# Patient Record
Sex: Male | Born: 1960
Health system: Southern US, Community
[De-identification: ages and names within clinical notes are randomized; demographics above are authoritative.]

## PROBLEM LIST (undated history)

## (undated) DIAGNOSIS — I10 Essential (primary) hypertension: Secondary | ICD-10-CM

## (undated) DIAGNOSIS — F419 Anxiety disorder, unspecified: Secondary | ICD-10-CM

## (undated) DIAGNOSIS — Z87442 Personal history of urinary calculi: Secondary | ICD-10-CM

## (undated) DIAGNOSIS — K219 Gastro-esophageal reflux disease without esophagitis: Secondary | ICD-10-CM

## (undated) DIAGNOSIS — E785 Hyperlipidemia, unspecified: Secondary | ICD-10-CM

## (undated) DIAGNOSIS — D1720 Benign lipomatous neoplasm of skin and subcutaneous tissue of unspecified limb: Secondary | ICD-10-CM

## (undated) DIAGNOSIS — N529 Male erectile dysfunction, unspecified: Secondary | ICD-10-CM

## (undated) DIAGNOSIS — Z87898 Personal history of other specified conditions: Secondary | ICD-10-CM

## (undated) DIAGNOSIS — N1330 Unspecified hydronephrosis: Secondary | ICD-10-CM

## (undated) DIAGNOSIS — T884XXA Failed or difficult intubation, initial encounter: Secondary | ICD-10-CM

## (undated) DIAGNOSIS — Z8719 Personal history of other diseases of the digestive system: Secondary | ICD-10-CM

## (undated) DIAGNOSIS — E119 Type 2 diabetes mellitus without complications: Secondary | ICD-10-CM

## (undated) DIAGNOSIS — Z860100 Personal history of colon polyps, unspecified: Secondary | ICD-10-CM

## (undated) DIAGNOSIS — D1779 Benign lipomatous neoplasm of other sites: Secondary | ICD-10-CM

## (undated) DIAGNOSIS — R519 Headache, unspecified: Secondary | ICD-10-CM

## (undated) HISTORY — DX: Anxiety disorder, unspecified: F41.9

## (undated) HISTORY — DX: Hyperlipidemia, unspecified: E78.5

## (undated) HISTORY — DX: Personal history of colonic polyps: Z86.010

## (undated) HISTORY — DX: Benign lipomatous neoplasm of skin and subcutaneous tissue of unspecified limb: D17.20

## (undated) HISTORY — DX: Essential (primary) hypertension: I10

## (undated) HISTORY — DX: Personal history of colon polyps, unspecified: Z86.0100

## (undated) HISTORY — DX: Benign lipomatous neoplasm of other sites: D17.79

## (undated) HISTORY — PX: INGUINAL HERNIA REPAIR: SUR1180

## (undated) HISTORY — PX: VENTRAL HERNIA REPAIR: SHX424

## (undated) HISTORY — DX: Male erectile dysfunction, unspecified: N52.9

## (undated) HISTORY — DX: Type 2 diabetes mellitus without complications: E11.9

---
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
## 1987-01-29 HISTORY — PX: BRAIN SURGERY: SHX531

## 2008-05-11 ENCOUNTER — Ambulatory Visit: Payer: Self-pay | Admitting: Family Medicine

## 2008-08-03 ENCOUNTER — Other Ambulatory Visit: Payer: Self-pay | Admitting: Family Medicine

## 2009-06-19 ENCOUNTER — Other Ambulatory Visit: Payer: Self-pay

## 2009-06-20 ENCOUNTER — Emergency Department: Payer: Self-pay

## 2009-06-28 ENCOUNTER — Ambulatory Visit: Payer: Self-pay | Admitting: Family Medicine

## 2009-06-28 LAB — HM COLONOSCOPY: HM Colonoscopy: NORMAL

## 2009-09-11 ENCOUNTER — Emergency Department: Payer: Self-pay | Admitting: Emergency Medicine

## 2009-10-13 ENCOUNTER — Ambulatory Visit: Payer: Self-pay | Admitting: Unknown Physician Specialty

## 2010-09-24 LAB — HM DIABETES EYE EXAM

## 2010-12-17 ENCOUNTER — Ambulatory Visit: Payer: Self-pay | Admitting: Internal Medicine

## 2011-01-04 ENCOUNTER — Encounter: Payer: Self-pay | Admitting: Internal Medicine

## 2011-01-04 ENCOUNTER — Ambulatory Visit (INDEPENDENT_AMBULATORY_CARE_PROVIDER_SITE_OTHER): Payer: PRIVATE HEALTH INSURANCE | Admitting: Internal Medicine

## 2011-01-04 VITALS — BP 132/82 | HR 72 | Temp 98.2°F | Wt 210.0 lb

## 2011-01-04 DIAGNOSIS — R079 Chest pain, unspecified: Secondary | ICD-10-CM

## 2011-01-04 DIAGNOSIS — J069 Acute upper respiratory infection, unspecified: Secondary | ICD-10-CM

## 2011-01-04 DIAGNOSIS — I10 Essential (primary) hypertension: Secondary | ICD-10-CM

## 2011-01-04 DIAGNOSIS — R7309 Other abnormal glucose: Secondary | ICD-10-CM

## 2011-01-04 DIAGNOSIS — R739 Hyperglycemia, unspecified: Secondary | ICD-10-CM

## 2011-01-04 HISTORY — DX: Essential (primary) hypertension: I10

## 2011-01-04 MED ORDER — TELMISARTAN-HCTZ 80-12.5 MG PO TABS: 1.0000 | ORAL_TABLET | Freq: Every day | ORAL | Status: DC

## 2011-01-04 NOTE — Progress Notes (Signed)
Subjective:    Patient ID: Andrew Stevens, male    DOB: 17-Apr-1960, 50 y.o.   MRN: [id number]  HPI 50 year old male presents to establish care. He reports a history of hypertension. He reports that he's been taking Micardis for several years. 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 However, he reports that when he checks his blood pressure at home it is typically less than 140/90. He denies any headache or palpitations. He reports full compliance with this medicine.  He notes that approximately one month ago he had a single episode of chest pain which occurred at rest. He was sitting in a chair at home in the evening and developed some tightness across the left side of his chest. It radiated into his jaw. He reports that it resolved after several minutes of rest. He did not have any shortness of breath, diaphoresis, or nausea during this time. This pain has not recurred. He had an EKG performed at his wife's medical office which was normal.  He also brings record today from recent employee health screening. This screening lab work was done nonfasting. It showed elevated blood sugar of 147. It also showed elevated hemoglobin A1c of 6.7%. It also showed elevated cholesterol with total cholesterol greater than 200 and LDL cholesterol of 128. Patient is concerned that this lab work was not completed fasting. He would like to have it repeated. He has never been diagnosed with diabetes.  He also notes a several week history of clear nasal congestion and occasional cough. He notes that he cares for his grandchildren who are often sick. He has not had fever or chills. He has been taking DayQuil and NyQuil as needed for cough and congestion.  Outpatient Encounter Prescriptions as of 01/04/2011  Medication Sig Dispense Refill  . Multiple Vitamins-Minerals (MULTIVITAMIN WITH MINERALS) tablet Take 1 tablet by mouth  daily.        . naproxen sodium (ANAPROX) 220 MG tablet Take 220 mg by mouth as needed.        Marland Kitchen telmisartan-hydrochlorothiazide (MICARDIS HCT) 80-12.5 MG per tablet Take 1 tablet by mouth daily.  30 tablet  6    Review of Systems  Constitutional: Negative for fever, chills, activity change, appetite change, fatigue and unexpected weight change.  HENT: Positive for congestion, rhinorrhea and postnasal drip. Negative for sinus pressure.   Eyes: Negative for visual disturbance.  Respiratory: Positive for cough and chest tightness. Negative for shortness of breath.   Cardiovascular: Positive for chest pain. Negative for palpitations and leg swelling.  Gastrointestinal: Negative for abdominal pain and abdominal distention.  Genitourinary: Negative for dysuria, urgency and difficulty urinating.  Musculoskeletal: Negative for arthralgias and gait problem.  Skin: Negative for color change and rash.  Hematological: Negative for adenopathy.  Psychiatric/Behavioral: Negative for sleep disturbance and dysphoric mood. The patient is not nervous/anxious.    BP 132/82  Pulse 72  Temp(Src) 98.2 F (36.8 C) (Oral)  Wt 210 lb (95.255 kg)  SpO2 97%     Objective:   Physical Exam  Constitutional: He is oriented to person, place, and time. He appears well-developed and well-nourished. No distress.  HENT:  Head: Normocephalic and atraumatic.  Right Ear: External ear normal.  Left Ear: External ear normal.  Nose: Nose normal.  Mouth/Throat: Oropharynx is clear and moist. No oropharyngeal exudate.  Eyes: Conjunctivae and EOM are normal. Pupils are equal, round, and reactive to  light. Right eye exhibits no discharge. Left eye exhibits no discharge. No scleral icterus.  Neck: Normal range of motion. Neck supple. No tracheal deviation present. No thyromegaly present.  Cardiovascular: Normal rate, regular rhythm and normal heart sounds.  Exam reveals no gallop and no friction rub.   No murmur  heard. Pulmonary/Chest: Effort normal and breath sounds normal. No respiratory distress. He has no wheezes. He has no rales. He exhibits no tenderness.  Abdominal: Soft. Bowel sounds are normal. He exhibits no distension and no mass. There is no tenderness. There is no rebound and no guarding.  Musculoskeletal: Normal range of motion. He exhibits no edema.  Lymphadenopathy:    He has no cervical adenopathy.  Neurological: He is alert and oriented to person, place, and time. No cranial nerve deficit. Coordination normal.  Skin: Skin is warm and dry. No rash noted. He is not diaphoretic. No erythema. No pallor.  Psychiatric: He has a normal mood and affect. His behavior is normal. Judgment and thought content normal.          Assessment & Plan:  1. Hypertension - blood pressure well-controlled today. We'll continue Micardis. We'll check renal function with labs.  2. Chest pain - his description of this single episode of chest pain is concerning for cardiac etiology. His EKG today is difficult to interpret because of frequent ventricular ectopic beats. Would like to get a baseline treadmill stress test for evaluation. We will set this up for him. We will also do some risk assessment by rechecking lipids fasting.  3. Hyperglycemia -patient brings record of labs from employee health which showed elevated blood sugar of 147 and hemoglobin A1c of 6.7%. These were not done fasting. We'll recheck these labs fasting. Will bring patient back in one month to review.  4. Viral URI - patients symptoms are most consistent with viral upper respiratory infection. His exam is normal. He will continue to use over-the-counter cough and cold preparations as needed. He will call if symptoms are persistent or fever develops.  5. Health maintenance - patient is up-to-date on health maintenance including his flu shot. Will obtain records from his previous physician.

## 2011-01-05 ENCOUNTER — Other Ambulatory Visit: Payer: Self-pay | Admitting: Internal Medicine

## 2011-01-31 ENCOUNTER — Ambulatory Visit (INDEPENDENT_AMBULATORY_CARE_PROVIDER_SITE_OTHER): Payer: PRIVATE HEALTH INSURANCE | Admitting: Cardiovascular Disease

## 2011-01-31 ENCOUNTER — Encounter: Payer: Self-pay | Admitting: Cardiovascular Disease

## 2011-01-31 DIAGNOSIS — R079 Chest pain, unspecified: Secondary | ICD-10-CM | POA: Insufficient documentation

## 2011-01-31 NOTE — Progress Notes (Signed)
Exercise Treadmill Test  Treadmill ordered for recent epsiodes of chest pain.  Resting EKG shows NSR with rate of 70 bpm, No significant ST or T wave changes Resting blood pressure of 150/78. Stand bruce protocal was used.  Patient exercised for 8 min  Peak heart rate of 159 bpm.  This was 95% of the maximum predicted heart rate (target heart rate 168). Achieved 10.1 METS No symptoms of chest pain or lightheadedness were reported at peak stress or in recovery.  Peak Blood pressure recorded was 188/88. Heart rate at 3 minutes in recovery was 108 bpm. No significant ST abnormality noted with stress concerning for ischemia.  Peak ST depression was 0.7 mm upsloping, which is within normal limits  FINAL IMPRESSION: Normal exercise stress test. No significant EKG changes concerning for ischemia. Excellent exercise tolerance.

## 2011-01-31 NOTE — Patient Instructions (Signed)
Your treadmill study was normal. No further workup needed at this time. The report will be forwarded to your primary care physician.

## 2011-02-01 ENCOUNTER — Other Ambulatory Visit: Payer: Self-pay | Admitting: Cardiovascular Disease

## 2011-02-08 ENCOUNTER — Ambulatory Visit: Payer: PRIVATE HEALTH INSURANCE | Admitting: Internal Medicine

## 2011-06-10 ENCOUNTER — Other Ambulatory Visit: Payer: Self-pay | Admitting: Internal Medicine

## 2011-06-10 ENCOUNTER — Ambulatory Visit (INDEPENDENT_AMBULATORY_CARE_PROVIDER_SITE_OTHER): Payer: PRIVATE HEALTH INSURANCE | Admitting: Internal Medicine

## 2011-06-10 ENCOUNTER — Encounter: Payer: Self-pay | Admitting: Internal Medicine

## 2011-06-10 VITALS — BP 160/103 | HR 72 | Temp 98.0°F | Resp 16 | Wt 213.8 lb

## 2011-06-10 DIAGNOSIS — R739 Hyperglycemia, unspecified: Secondary | ICD-10-CM | POA: Insufficient documentation

## 2011-06-10 DIAGNOSIS — N529 Male erectile dysfunction, unspecified: Secondary | ICD-10-CM

## 2011-06-10 DIAGNOSIS — R7309 Other abnormal glucose: Secondary | ICD-10-CM

## 2011-06-10 DIAGNOSIS — I10 Essential (primary) hypertension: Secondary | ICD-10-CM

## 2011-06-10 LAB — COMPREHENSIVE METABOLIC PANEL
Albumin: 4 g/dL (ref 3.4–5.0)
Alkaline Phosphatase: 65 U/L (ref 50–136)
Anion Gap: 7 (ref 7–16)
BUN: 10 mg/dL (ref 7–18)
Bilirubin,Total: 0.7 mg/dL (ref 0.2–1.0)
EGFR (African American): >60
Glucose: 92 mg/dL (ref 65–99)
Osmolality: 280 (ref 275–301)
Potassium: 3.7 mmol/L (ref 3.5–5.1)
SGOT(AST): 21 U/L (ref 15–37)
SGPT (ALT): 25 U/L

## 2011-06-10 LAB — TSH: Thyroid Stimulating Horm: 1.7 u[IU]/mL

## 2011-06-10 LAB — LIPID PANEL
Cholesterol: 167 mg/dL (ref 0–200)
Triglycerides: 79 mg/dL (ref 0–200)

## 2011-06-10 MED ORDER — TELMISARTAN-HCTZ 80-12.5 MG PO TABS: 1.0000 | ORAL_TABLET | Freq: Every day | ORAL | Status: DC

## 2011-06-10 NOTE — Assessment & Plan Note (Signed)
Will check testosterone level with labs. However, based on symptoms suspect erectile dysfunction is secondary to hypertension and potentially diabetes. Followup in 2-3 weeks. We discussed the potential of starting Cialis, however he would prefer to hold off on this today.

## 2011-06-10 NOTE — Assessment & Plan Note (Signed)
Blood pressure markedly elevated today, however patient has not been taking his medication. Will refill medication. Will check renal function with labs. Followup in 3-4 weeks.

## 2011-06-10 NOTE — Progress Notes (Signed)
Subjective:    Patient ID: Andrew Stevens, male    DOB: [birth year], 51 y.o.   MRN: [id number]  HPI 51 year old male with history of hypertension presents for followup. He reports that he has been out of his Micardis hydrochlorothiazide for the last few weeks. He has been using samples of plain Micardis which his wife was able to obtain. He denies any headache, chest pain, palpitations. He has not been checking his blood pressure.  He is also concerned today about recent issues with erectile dysfunction. He reports difficulty establishing and maintaining an erection. He questions whether his testosterone may be low. He denies any decreased libido or fatigue.  Today, we reviewed lab work from December 2012. This showed elevated blood sugar with hemoglobin A1c of 6.7%. He has never been diagnosed with diabetes. He has never taken medications for elevated blood sugar.  Outpatient Encounter Prescriptions as of 06/10/2011  Medication Sig Dispense Refill  . Multiple Vitamins-Minerals (MULTIVITAMIN WITH MINERALS) tablet Take 1 tablet by mouth daily.        . naproxen sodium (ANAPROX) 220 MG tablet Take 220 mg by mouth as needed.        Marland Kitchen telmisartan-hydrochlorothiazide (MICARDIS HCT) 80-12.5 MG per tablet Take 1 tablet by mouth daily.  30 tablet  6  . DISCONTD: telmisartan-hydrochlorothiazide (MICARDIS HCT) 80-12.5 MG per tablet Take 1 tablet by mouth daily.  30 tablet  6   BP 160/103  Pulse 72  Temp(Src) 98 F (36.7 C) (Oral)  Resp 16  Wt 213 lb 12 oz (96.956 kg)  SpO2 98%  Review of Systems  Constitutional: Negative for fever, chills, activity change, appetite change, fatigue and unexpected weight change.  Eyes: Negative for visual disturbance.  Respiratory: Negative for cough and shortness of breath.   Cardiovascular: Negative for chest pain, palpitations and leg swelling.  Gastrointestinal: Negative for abdominal pain and abdominal distention.  Genitourinary: Negative for dysuria, urgency  and difficulty urinating.  Musculoskeletal: Negative for arthralgias and gait problem.  Skin: Negative for color change and rash.  Hematological: Negative for adenopathy.  Psychiatric/Behavioral: Negative for sleep disturbance and dysphoric mood. The patient is not nervous/anxious.        Objective:   Physical Exam  Constitutional: He is oriented to person, place, and time. He appears well-developed and well-nourished. No distress.  HENT:  Head: Normocephalic and atraumatic.  Right Ear: External ear normal.  Left Ear: External ear normal.  Nose: Nose normal.  Mouth/Throat: Oropharynx is clear and moist. No oropharyngeal exudate.  Eyes: Conjunctivae and EOM are normal. Pupils are equal, round, and reactive to light. Right eye exhibits no discharge. Left eye exhibits no discharge. No scleral icterus.  Neck: Normal range of motion. Neck supple. No tracheal deviation present. No thyromegaly present.  Cardiovascular: Normal rate, regular rhythm and normal heart sounds.  Exam reveals no gallop and no friction rub.   No murmur heard. Pulmonary/Chest: Effort normal and breath sounds normal. No respiratory distress. He has no wheezes. He has no rales. He exhibits no tenderness.  Musculoskeletal: Normal range of motion. He exhibits no edema.  Lymphadenopathy:    He has no cervical adenopathy.  Neurological: He is alert and oriented to person, place, and time. No cranial nerve deficit. Coordination normal.  Skin: Skin is warm and dry. No rash noted. He is not diaphoretic. No erythema. No pallor.  Psychiatric: He has a normal mood and affect. His behavior is normal. Judgment and thought content normal.  Assessment & Plan:  See problem list

## 2011-06-10 NOTE — Assessment & Plan Note (Signed)
Will recheck blood sugar and hemoglobin A1c with labs today. Patient will return to clinic in 3-4 weeks to review labs. If persistently elevated, would favor starting metformin.

## 2011-06-11 ENCOUNTER — Telehealth: Payer: Self-pay | Admitting: Internal Medicine

## 2011-06-11 MED ORDER — METFORMIN HCL 500 MG PO TABS: 500.0000 mg | ORAL_TABLET | Freq: Two times a day (BID) | ORAL | Status: DC

## 2011-06-11 NOTE — Telephone Encounter (Signed)
Labs show that A1c is elevated at 6.4%. This is consistent with borderline diabetes. I would like for him to stop by the office and pick up a glucometer. He will need to check his blood sugar 1-2 times per day for the next couple of weeks, then bring his readings to his next visit. I would also like to start metformin 500mg  bid.  We can call this into a local pharmacy. Other labs are normal. Free testosterone is still pending.

## 2011-06-11 NOTE — Telephone Encounter (Signed)
Patient notified. Patient will come in for glucometer. rx for metformin called to pharmacy.

## 2011-06-14 ENCOUNTER — Encounter: Payer: Self-pay | Admitting: Internal Medicine

## 2011-06-17 ENCOUNTER — Telehealth: Payer: Self-pay | Admitting: *Deleted

## 2011-06-17 NOTE — Telephone Encounter (Signed)
Patient wanted to let you know that he has decided to not take the metformin, he is going to get his sugar under control with watching what he eats He says that he will discuss this with you at his next follow up.

## 2011-07-05 ENCOUNTER — Other Ambulatory Visit: Payer: Self-pay | Admitting: Internal Medicine

## 2011-07-05 DIAGNOSIS — E119 Type 2 diabetes mellitus without complications: Secondary | ICD-10-CM

## 2011-07-05 MED ORDER — GLUCOSE BLOOD VI STRP: ORAL_STRIP | Status: DC

## 2011-07-05 MED ORDER — FREESTYLE LANCETS MISC: Status: DC

## 2011-07-11 ENCOUNTER — Encounter: Payer: Self-pay | Admitting: Internal Medicine

## 2011-07-11 ENCOUNTER — Ambulatory Visit (INDEPENDENT_AMBULATORY_CARE_PROVIDER_SITE_OTHER): Payer: PRIVATE HEALTH INSURANCE | Admitting: Internal Medicine

## 2011-07-11 VITALS — BP 112/80 | HR 76 | Temp 98.8°F | Wt 205.5 lb

## 2011-07-11 DIAGNOSIS — R7309 Other abnormal glucose: Secondary | ICD-10-CM

## 2011-07-11 DIAGNOSIS — M5430 Sciatica, unspecified side: Secondary | ICD-10-CM

## 2011-07-11 DIAGNOSIS — I10 Essential (primary) hypertension: Secondary | ICD-10-CM

## 2011-07-11 DIAGNOSIS — M5431 Sciatica, right side: Secondary | ICD-10-CM | POA: Insufficient documentation

## 2011-07-11 DIAGNOSIS — R739 Hyperglycemia, unspecified: Secondary | ICD-10-CM

## 2011-07-11 NOTE — Assessment & Plan Note (Signed)
A1c was 6.4% on recent labs. Encourage continued compliance with healthy diet and regular physical activity. Will plan to repeat A1c in August 2013.

## 2011-07-11 NOTE — Progress Notes (Signed)
Subjective:    Patient ID: Andrew Stevens, male    DOB: [birth year], 51 y.o.   MRN: [id number]  HPI 51 year old male with history of hypertension, elevated blood sugars presents for followup. In regards to his blood sugars, recent A1c was 6.4%. He notes that blood sugars have been between 90 and 112 fasting. He reports significant improvement in diet with increased intake of fruits and vegetables and decreased processed carbohydrates. He is also starting some physical activity with walking.  In regards to his hypertension, he reports better compliance with his medications. He has not been regularly checking his blood pressure. He denies any chest pain, headache, palpitations.  He recently was evaluated for right-sided sciatic pain and has been using ibuprofen and massage therapy with some improvement in pain. He notes that the pain initially radiated down the back of his right leg leading to some numbness in his toes. This has resolved.  Outpatient Encounter Prescriptions as of 07/11/2011  Medication Sig Dispense Refill  . glucose blood (FREESTYLE LITE) test strip Use as instructed  100 each  12  . Lancets (FREESTYLE) lancets Use as instructed  100 each  12  . Multiple Vitamins-Minerals (MULTIVITAMIN WITH MINERALS) tablet Take 1 tablet by mouth daily.        . naproxen sodium (ANAPROX) 220 MG tablet Take 220 mg by mouth as needed.        Marland Kitchen telmisartan-hydrochlorothiazide (MICARDIS HCT) 80-12.5 MG per tablet Take 1 tablet by mouth daily.  30 tablet  6  . DISCONTD: metFORMIN (GLUCOPHAGE) 500 MG tablet Take 1 tablet (500 mg total) by mouth 2 (two) times daily with a meal.  60 tablet  2    Review of Systems  Constitutional: Negative for fever, chills, activity change, appetite change, fatigue and unexpected weight change.  Eyes: Negative for visual disturbance.  Respiratory: Negative for cough and shortness of breath.   Cardiovascular: Negative for chest pain, palpitations and leg swelling.    Gastrointestinal: Negative for abdominal pain and abdominal distention.  Genitourinary: Negative for dysuria, urgency and difficulty urinating.  Musculoskeletal: Positive for myalgias and arthralgias. Negative for gait problem.  Skin: Negative for color change and rash.  Neurological: Positive for numbness. Negative for weakness.  Hematological: Negative for adenopathy.  Psychiatric/Behavioral: Negative for disturbed wake/sleep cycle and dysphoric mood. The patient is not nervous/anxious.    BP 112/80  Pulse 76  Temp 98.8 F (37.1 C) (Oral)  Wt 205 lb 8 oz (93.214 kg)  SpO2 98%     Objective:   Physical Exam  Constitutional: He is oriented to person, place, and time. He appears well-developed and well-nourished. No distress.  HENT:  Head: Normocephalic and atraumatic.  Right Ear: External ear normal.  Left Ear: External ear normal.  Nose: Nose normal.  Mouth/Throat: Oropharynx is clear and moist. No oropharyngeal exudate.  Eyes: Conjunctivae and EOM are normal. Pupils are equal, round, and reactive to light. Right eye exhibits no discharge. Left eye exhibits no discharge. No scleral icterus.  Neck: Normal range of motion. Neck supple. No tracheal deviation present. No thyromegaly present.  Cardiovascular: Normal rate, regular rhythm and normal heart sounds.  Exam reveals no gallop and no friction rub.   No murmur heard. Pulmonary/Chest: Effort normal and breath sounds normal. No respiratory distress. He has no wheezes. He has no rales. He exhibits no tenderness.  Musculoskeletal: Normal range of motion. He exhibits no edema.  Lymphadenopathy:    He has no cervical adenopathy.  Neurological: He is alert  and oriented to person, place, and time. No cranial nerve deficit. Coordination normal.  Skin: Skin is warm and dry. No rash noted. He is not diaphoretic. No erythema. No pallor.  Psychiatric: He has a normal mood and affect. His behavior is normal. Judgment and thought content  normal.          Assessment & Plan:

## 2011-07-11 NOTE — Assessment & Plan Note (Signed)
Symptoms improved with ibuprofen and massage. We'll continue to monitor.

## 2011-07-11 NOTE — Assessment & Plan Note (Signed)
Blood pressure much improved today with better compliance with diet and medication. Encourage continued efforts at healthy diet, physical exercise and compliance with medicines. Will recheck renal function in August 2013.

## 2011-07-12 ENCOUNTER — Telehealth: Payer: Self-pay | Admitting: Internal Medicine

## 2011-07-12 NOTE — Telephone Encounter (Signed)
Free testosterone was normal.

## 2011-07-12 NOTE — Telephone Encounter (Signed)
Patients spouse advised as instructed via telephone. 

## 2011-09-09 ENCOUNTER — Ambulatory Visit: Payer: PRIVATE HEALTH INSURANCE | Admitting: Internal Medicine

## 2011-10-17 ENCOUNTER — Ambulatory Visit: Payer: Self-pay | Admitting: General Practice

## 2011-12-11 ENCOUNTER — Ambulatory Visit (INDEPENDENT_AMBULATORY_CARE_PROVIDER_SITE_OTHER): Payer: Worker's Compensation | Admitting: Cardiovascular Disease

## 2011-12-11 ENCOUNTER — Encounter: Payer: Self-pay | Admitting: Cardiovascular Disease

## 2011-12-11 VITALS — BP 143/92 | HR 73 | Ht 70.0 in | Wt 206.8 lb

## 2011-12-11 DIAGNOSIS — M75100 Unspecified rotator cuff tear or rupture of unspecified shoulder, not specified as traumatic: Secondary | ICD-10-CM

## 2011-12-11 DIAGNOSIS — I10 Essential (primary) hypertension: Secondary | ICD-10-CM

## 2011-12-11 DIAGNOSIS — R0789 Other chest pain: Secondary | ICD-10-CM

## 2011-12-11 DIAGNOSIS — S43429A Sprain of unspecified rotator cuff capsule, initial encounter: Secondary | ICD-10-CM

## 2011-12-11 NOTE — Progress Notes (Signed)
   Patient ID: Andrew Stevens, male    DOB: [birth year], 51 y.o.   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  He reports that in July 2013, he was pushing a bed in the hospital and he hit a wall with trauma to his right shoulder. He did undergo evaluation with cortisone shot, MRI and physical therapy. He has good range of motion but some clicking and pain with certain maneuvers. It was recommended that he have surgery to repair for rotator cuff.   He does have occasional GERD and takes TUMS when necessary with good relief. He is otherwise active with no symptoms of chest pain or shortness of breath. No significant lightheadedness or dizziness, no edema. Overall he feels well apart from his shoulder.  EKG shows normal sinus rhythm with rate 72 beats per minute with no significant ST or T wave changes.   Outpatient Encounter Prescriptions as of 12/11/2011  Medication Sig Dispense Refill  . Multiple Vitamins-Minerals (MULTIVITAMIN WITH MINERALS) tablet Take 1 tablet by mouth daily.        . Naproxen Sodium (ALEVE PO) Take by mouth as needed.      Marland Kitchen telmisartan-hydrochlorothiazide (MICARDIS HCT) 80-12.5 MG per tablet Take 1 tablet by mouth daily.  30 tablet  6    Review of Systems  Constitutional: Negative.   HENT: Negative.   Eyes: Negative.   Respiratory: Negative.   Cardiovascular: Negative.   Gastrointestinal: Negative.   Musculoskeletal: Positive for arthralgias.  Skin: Negative.   Neurological: Negative.   Hematological: Negative.   Psychiatric/Behavioral: Negative.   All other systems reviewed and are negative.    BP 143/92  Pulse 73  Ht 5\' 10"  (1.778 m)  Wt 206 lb  12 oz (93.781 kg)  BMI 29.67 kg/m2  Physical Exam  Nursing note and vitals reviewed. Constitutional: He is oriented to person, place, and time. He appears well-developed and well-nourished.  HENT:  Head: Normocephalic.  Nose: Nose normal.  Mouth/Throat: Oropharynx is clear and moist.  Eyes: Conjunctivae normal are normal. Pupils are equal, round, and reactive to light.  Neck: Normal range of motion. Neck supple. No JVD present.  Cardiovascular: Normal rate, regular rhythm, S1 normal, S2 normal, normal heart sounds and intact distal pulses.  Exam reveals no gallop and no friction rub.   No murmur heard. Pulmonary/Chest: Effort normal and breath sounds normal. No respiratory distress. He has no wheezes. He has no rales. He exhibits no tenderness.  Abdominal: Soft. Bowel sounds are normal. He exhibits no distension. There is no tenderness.  Musculoskeletal: Normal range of motion. He exhibits no edema and no tenderness.  Lymphadenopathy:    He has no cervical adenopathy.  Neurological: He is alert and oriented to person, place, and time. Coordination normal.  Skin: Skin is warm and dry. No rash noted. No erythema.  Psychiatric: He has a normal mood and affect. His behavior is normal. Judgment and thought content normal.           Assessment and Plan

## 2011-12-11 NOTE — Assessment & Plan Note (Signed)
Blood pressure  is borderline elevated on today's visit. He reports is well controlled at home. No changes made to the medications.

## 2011-12-11 NOTE — Patient Instructions (Addendum)
You are doing well. No medication changes were made.  Please call us if you have new issues that need to be addressed before your next appt.  Your physician wants you to follow-up in: 12 months.  You will receive a reminder letter in the mail two months in advance. If you don't receive a letter, please call our office to schedule the follow-up appointment. 

## 2011-12-11 NOTE — Assessment & Plan Note (Signed)
Prior trauma on the right while at work. MRI by his report confirming rotator cuff tear. Plan is for surgery by Dr. Martha Clan. No further workup needed at this time. He would be acceptable risk. No active angina. No known coronary artery disease. Blood pressures relatively well controlled.

## 2011-12-17 ENCOUNTER — Ambulatory Visit: Payer: Self-pay | Admitting: Orthopedic Surgery

## 2011-12-17 LAB — BASIC METABOLIC PANEL
BUN: 11 mg/dL (ref 7–18)
Calcium, Total: 9.2 mg/dL (ref 8.5–10.1)
Chloride: 104 mmol/L (ref 98–107)
Creatinine: 0.89 mg/dL (ref 0.60–1.30)
Glucose: 113 mg/dL — ABNORMAL HIGH (ref 65–99)
Osmolality: 278 (ref 275–301)
Potassium: 3.7 mmol/L (ref 3.5–5.1)
Sodium: 139 mmol/L (ref 136–145)

## 2011-12-17 LAB — CBC
MCH: 31 pg (ref 26.0–34.0)
MCHC: 35.3 g/dL (ref 32.0–36.0)
Platelet: 261 10*3/uL (ref 150–440)
RBC: 5.25 10*6/uL (ref 4.40–5.90)
WBC: 8 10*3/uL (ref 3.8–10.6)

## 2011-12-17 LAB — URINALYSIS, COMPLETE
Glucose,UR: NEGATIVE mg/dL (ref 0–75)
Ketone: NEGATIVE
Nitrite: NEGATIVE
Ph: 5 (ref 4.5–8.0)
Protein: NEGATIVE
Specific Gravity: 1.021 (ref 1.003–1.030)

## 2011-12-25 ENCOUNTER — Ambulatory Visit: Payer: Self-pay | Admitting: Orthopedic Surgery

## 2012-01-07 ENCOUNTER — Encounter: Payer: Self-pay | Admitting: Orthopedic Surgery

## 2012-01-29 ENCOUNTER — Encounter: Payer: Self-pay | Admitting: Orthopedic Surgery

## 2012-01-29 HISTORY — PX: ROTATOR CUFF REPAIR: SHX139

## 2012-02-29 ENCOUNTER — Encounter: Payer: Self-pay | Admitting: Orthopedic Surgery

## 2012-03-28 ENCOUNTER — Encounter: Payer: Self-pay | Admitting: Orthopedic Surgery

## 2012-04-21 ENCOUNTER — Ambulatory Visit: Payer: Self-pay | Admitting: Orthopedic Surgery

## 2012-04-28 ENCOUNTER — Encounter: Payer: Self-pay | Admitting: Orthopedic Surgery

## 2013-01-28 HISTORY — PX: COLONOSCOPY: SHX174

## 2013-04-20 ENCOUNTER — Encounter: Payer: Self-pay | Admitting: Internal Medicine

## 2013-05-04 ENCOUNTER — Encounter: Payer: Self-pay | Admitting: Internal Medicine

## 2013-05-20 ENCOUNTER — Encounter: Payer: Self-pay | Admitting: Internal Medicine

## 2013-05-20 ENCOUNTER — Ambulatory Visit (INDEPENDENT_AMBULATORY_CARE_PROVIDER_SITE_OTHER): Payer: 59 | Admitting: Internal Medicine

## 2013-05-20 VITALS — BP 160/100 | HR 82 | Temp 98.4°F | Resp 16 | Ht 69.0 in | Wt 204.8 lb

## 2013-05-20 DIAGNOSIS — I10 Essential (primary) hypertension: Secondary | ICD-10-CM

## 2013-05-20 DIAGNOSIS — Z1211 Encounter for screening for malignant neoplasm of colon: Secondary | ICD-10-CM

## 2013-05-20 DIAGNOSIS — Z0000 Encounter for general adult medical examination without abnormal findings: Secondary | ICD-10-CM

## 2013-05-20 HISTORY — DX: Encounter for screening for malignant neoplasm of colon: Z12.11

## 2013-05-20 HISTORY — DX: Encounter for general adult medical examination without abnormal findings: Z00.00

## 2013-05-20 MED ORDER — TELMISARTAN-HCTZ 80-12.5 MG PO TABS: 1.0000 | ORAL_TABLET | Freq: Every day | ORAL | Status: DC

## 2013-05-20 NOTE — Progress Notes (Signed)
Pre visit review using our clinic review tool, if applicable. No additional management support is needed unless otherwise documented below in the visit note. 

## 2013-05-20 NOTE — Assessment & Plan Note (Signed)
BP Readings from Last 3 Encounters:  05/20/13 160/100  12/11/11 143/92  07/11/11 112/80   BP elevated, however pt has been off meds. Will restart Micardis. Will check renal function with labs. Follow up in 2 weeks.

## 2013-05-20 NOTE — Progress Notes (Signed)
Subjective:    Patient ID: Andrew Stevens, male    DOB: [birth year], 53 y.o.   MRN: [id number]  HPI 53YO male presents for annual exam.  HTN - has been out of BP medication for several months. No chest pain, headache, palpitations noted. BP much better controlled at home, typically 130-140s/80s.  Due for colonoscopy. Would like to schedule.  No new concerns today. Trying to follow healthy diet and exercise. Continues to work at [employer].  Review of Systems  Constitutional: Negative for fever, chills, activity change, appetite change, fatigue and unexpected weight change.  Eyes: Negative for visual disturbance.  Respiratory: Negative for cough and shortness of breath.   Cardiovascular: Negative for chest pain, palpitations and leg swelling.  Gastrointestinal: Negative for abdominal pain and abdominal distention.  Genitourinary: Negative for dysuria, urgency and difficulty urinating.  Musculoskeletal: Negative for arthralgias and gait problem.  Skin: Negative for color change and rash.  Hematological: Negative for adenopathy.  Psychiatric/Behavioral: Negative for sleep disturbance and dysphoric mood. The patient is not nervous/anxious.        Objective:    BP 160/100  Pulse 82  Temp(Src) 98.4 F (36.9 C) (Oral)  Resp 16  Ht 5\' 9"  (1.753 m)  Wt 204 lb 12 oz (92.874 kg)  BMI 30.22 kg/m2  SpO2 98% Physical Exam  Constitutional: He is oriented to person, place, and time. He appears well-developed and well-nourished. No distress.  HENT:  Head: Normocephalic and atraumatic.  Right Ear: External ear normal.  Left Ear: External ear normal.  Nose: Nose normal.  Mouth/Throat: Oropharynx is clear and moist. No oropharyngeal exudate.  Eyes: Conjunctivae and EOM are normal. Pupils are equal, round, and reactive to light. Right eye exhibits no discharge. Left eye exhibits no discharge. No scleral icterus.  Neck: Normal range of motion. Neck supple. No tracheal deviation present. No  thyromegaly present.  Cardiovascular: Normal rate, regular rhythm and normal heart sounds.  Exam reveals no gallop and no friction rub.   No murmur heard. Pulmonary/Chest: Effort normal and breath sounds normal. No accessory muscle usage. Not tachypneic. No respiratory distress. He has no decreased breath sounds. He has no wheezes. He has no rhonchi. He has no rales. He exhibits no tenderness.  Musculoskeletal: Normal range of motion. He exhibits no edema.  Lymphadenopathy:    He has no cervical adenopathy.  Neurological: He is alert and oriented to person, place, and time. No cranial nerve deficit. Coordination normal.  Skin: Skin is warm and dry. No rash noted. He is not diaphoretic. No erythema. No pallor.  Psychiatric: He has a normal mood and affect. His behavior is normal. Judgment and thought content normal.          Assessment & Plan:   Problem List Items Addressed This Visit   Hypertension      BP Readings from Last 3 Encounters:  05/20/13 160/100  12/11/11 143/92  07/11/11 112/80   BP elevated, however pt has been off meds. Will restart Micardis. Will check renal function with labs. Follow up in 2 weeks.    Relevant Medications      telmisartan-hydrochlorothiazide (MICARDIS HCT) 80-12.5 MG per tablet   Other Relevant Orders      Comprehensive metabolic panel      Microalbumin / creatinine urine ratio      EKG 12-Lead (Completed)   Routine general medical examination at a health care facility - Primary     General medical exam normal. Encouraged better compliance with medication. Encouraged healthy  diet and exercise. Will check CBC, CMP, lipids, A1c with labs. Immunizations are UTD. Follow up in 2 weeks.    Relevant Orders      CBC with Differential      Lipid panel      Vit D  25 hydroxy (rtn osteoporosis monitoring)      Hemoglobin A1c      PSA, total and free   Screening for colon cancer     Referral placed for colonoscopy.    Relevant Orders      Ambulatory  referral to Gastroenterology       Return in about 2 weeks (around 06/03/2013) for Recheck of Blood Pressure.

## 2013-05-20 NOTE — Assessment & Plan Note (Signed)
Referral placed for colonoscopy. 

## 2013-05-20 NOTE — Assessment & Plan Note (Signed)
General medical exam normal. Encouraged better compliance with medication. Encouraged healthy diet and exercise. Will check CBC, CMP, lipids, A1c with labs. Immunizations are UTD. Follow up in 2 weeks.

## 2013-05-21 ENCOUNTER — Telehealth: Payer: Self-pay | Admitting: Internal Medicine

## 2013-05-21 NOTE — Telephone Encounter (Signed)
Relevant patient education assigned to patient using Emmi. ° °

## 2013-06-08 ENCOUNTER — Ambulatory Visit (INDEPENDENT_AMBULATORY_CARE_PROVIDER_SITE_OTHER): Payer: 59 | Admitting: Internal Medicine

## 2013-06-08 ENCOUNTER — Encounter: Payer: Self-pay | Admitting: Internal Medicine

## 2013-06-08 VITALS — BP 142/80 | HR 89 | Resp 16 | Wt 203.2 lb

## 2013-06-08 DIAGNOSIS — I10 Essential (primary) hypertension: Secondary | ICD-10-CM

## 2013-06-08 NOTE — Progress Notes (Signed)
Pre visit review using our clinic review tool, if applicable. No additional management support is needed unless otherwise documented below in the visit note. 

## 2013-06-08 NOTE — Assessment & Plan Note (Signed)
BP Readings from Last 3 Encounters:  06/08/13 142/80  05/20/13 160/100  12/11/11 143/92   Blood pressure improved today compared to recent check. We'll continue Micardis hydrochlorothiazide. Plan check labs tomorrow including renal function. Followup in 3 months or sooner as needed.

## 2013-06-08 NOTE — Progress Notes (Signed)
   Subjective:    Patient ID: Andrew Stevens, male    DOB: July 17, 1960, 53 y.o.   MRN: [id number]  HPI 53YO male presents for follow up.  HTN - typically below 140 SBP. Taking Micardis-HCTZ. No recent chest pain, palpitations, headache.  Review of Systems  Constitutional: Negative for fever, chills, activity change, appetite change, fatigue and unexpected weight change.  Eyes: Negative for visual disturbance.  Respiratory: Negative for cough and shortness of breath.   Cardiovascular: Negative for chest pain, palpitations and leg swelling.  Gastrointestinal: Negative for abdominal pain and abdominal distention.  Genitourinary: Negative for dysuria, urgency and difficulty urinating.  Musculoskeletal: Negative for arthralgias and gait problem.  Skin: Negative for color change and rash.  Hematological: Negative for adenopathy.  Psychiatric/Behavioral: Negative for sleep disturbance and dysphoric mood. The patient is not nervous/anxious.        Objective:    BP 142/80  Pulse 89  Resp 16  Wt 203 lb 4 oz (92.194 kg)  SpO2 97% Physical Exam  Constitutional: He is oriented to person, place, and time. He appears well-developed and well-nourished. No distress.  HENT:  Head: Normocephalic and atraumatic.  Right Ear: External ear normal.  Left Ear: External ear normal.  Nose: Nose normal.  Mouth/Throat: Oropharynx is clear and moist. No oropharyngeal exudate.  Eyes: Conjunctivae and EOM are normal. Pupils are equal, round, and reactive to light. Right eye exhibits no discharge. Left eye exhibits no discharge. No scleral icterus.  Neck: Normal range of motion. Neck supple. No tracheal deviation present. No thyromegaly present.  Cardiovascular: Normal rate, regular rhythm and normal heart sounds.  Exam reveals no gallop and no friction rub.   No murmur heard. Pulmonary/Chest: Effort normal and breath sounds normal. No accessory muscle usage. Not tachypneic. No respiratory distress. He has no  decreased breath sounds. He has no wheezes. He has no rhonchi. He has no rales. He exhibits no tenderness.  Musculoskeletal: Normal range of motion. He exhibits no edema.  Lymphadenopathy:    He has no cervical adenopathy.  Neurological: He is alert and oriented to person, place, and time. No cranial nerve deficit. Coordination normal.  Skin: Skin is warm and dry. No rash noted. He is not diaphoretic. No erythema. No pallor.  Psychiatric: He has a normal mood and affect. His behavior is normal. Judgment and thought content normal.          Assessment & Plan:   Problem List Items Addressed This Visit   None       No Follow-up on file.

## 2013-06-09 ENCOUNTER — Other Ambulatory Visit: Payer: Self-pay | Admitting: *Deleted

## 2013-06-09 ENCOUNTER — Other Ambulatory Visit (INDEPENDENT_AMBULATORY_CARE_PROVIDER_SITE_OTHER): Payer: 59

## 2013-06-09 ENCOUNTER — Telehealth: Payer: Self-pay | Admitting: Internal Medicine

## 2013-06-09 DIAGNOSIS — I10 Essential (primary) hypertension: Secondary | ICD-10-CM

## 2013-06-09 DIAGNOSIS — Z0000 Encounter for general adult medical examination without abnormal findings: Secondary | ICD-10-CM

## 2013-06-09 LAB — LIPID PANEL
CHOL/HDL RATIO: 4
CHOLESTEROL: 205 mg/dL — AB (ref 0–200)
HDL: 48.1 mg/dL (ref >39.00)
LDL CALC: 146 mg/dL — AB (ref 0–99)
Triglycerides: 55 mg/dL (ref 0.0–149.0)
VLDL: 11 mg/dL (ref 0.0–40.0)

## 2013-06-09 LAB — MICROALBUMIN / CREATININE URINE RATIO
CREATININE, U: 157.3 mg/dL
Microalb Creat Ratio: 1 mg/g (ref 0.0–30.0)
Microalb, Ur: 1.6 mg/dL (ref 0.0–1.9)

## 2013-06-09 LAB — CBC WITH DIFFERENTIAL/PLATELET
BASOS ABS: 0 10*3/uL (ref 0.0–0.1)
Basophils Relative: 0.5 % (ref 0.0–3.0)
EOS PCT: 1.9 % (ref 0.0–5.0)
Eosinophils Absolute: 0.2 10*3/uL (ref 0.0–0.7)
HEMATOCRIT: 46.5 % (ref 39.0–52.0)
Hemoglobin: 15.8 g/dL (ref 13.0–17.0)
Lymphocytes Relative: 17.9 % (ref 12.0–46.0)
Lymphs Abs: 1.4 10*3/uL (ref 0.7–4.0)
MCHC: 33.9 g/dL (ref 30.0–36.0)
MCV: 89.6 fl (ref 78.0–100.0)
MONO ABS: 0.3 10*3/uL (ref 0.1–1.0)
Monocytes Relative: 4.1 % (ref 3.0–12.0)
NEUTROS PCT: 75.6 % (ref 43.0–77.0)
Neutro Abs: 6.1 10*3/uL (ref 1.4–7.7)
Platelets: 259 10*3/uL (ref 150.0–400.0)
RBC: 5.2 Mil/uL (ref 4.22–5.81)
RDW: 13.8 % (ref 11.5–15.5)
WBC: 8.1 10*3/uL (ref 4.0–10.5)

## 2013-06-09 LAB — COMPREHENSIVE METABOLIC PANEL
ALK PHOS: 58 U/L (ref 39–117)
ALT: 23 U/L (ref 0–53)
AST: 27 U/L (ref 0–37)
Albumin: 4.3 g/dL (ref 3.5–5.2)
BUN: 15 mg/dL (ref 6–23)
CO2: 32 mEq/L (ref 19–32)
Calcium: 9.3 mg/dL (ref 8.4–10.5)
Chloride: 102 mEq/L (ref 96–112)
Creatinine, Ser: 0.9 mg/dL (ref 0.4–1.5)
GFR: 93.26 mL/min (ref >60.00)
GLUCOSE: 138 mg/dL — AB (ref 70–99)
Potassium: 4.5 mEq/L (ref 3.5–5.1)
SODIUM: 141 meq/L (ref 135–145)
TOTAL PROTEIN: 7.6 g/dL (ref 6.0–8.3)
Total Bilirubin: 0.9 mg/dL (ref 0.2–1.2)

## 2013-06-09 LAB — HEMOGLOBIN A1C: HEMOGLOBIN A1C: 6.5 % (ref 4.6–6.5)

## 2013-06-09 MED ORDER — GLUCOSE BLOOD VI STRP: ORAL_STRIP | Status: DC

## 2013-06-09 MED ORDER — ATORVASTATIN CALCIUM 20 MG PO TABS: 20.0000 mg | ORAL_TABLET | Freq: Every day | ORAL | Status: DC

## 2013-06-09 MED ORDER — METFORMIN HCL 500 MG PO TABS: 500.0000 mg | ORAL_TABLET | Freq: Two times a day (BID) | ORAL | Status: DC

## 2013-06-09 MED ORDER — LANCETS MISC: Status: DC

## 2013-06-09 MED ORDER — ONETOUCH ULTRA SYSTEM W/DEVICE KIT: PACK | Status: DC

## 2013-06-09 NOTE — Telephone Encounter (Signed)
Relevant patient education assigned to patient using Emmi. ° °

## 2013-06-10 LAB — VITAMIN D 25 HYDROXY (VIT D DEFICIENCY, FRACTURES): VIT D 25 HYDROXY: 31 ng/mL (ref 30–89)

## 2013-06-10 LAB — PSA, TOTAL AND FREE
PSA, Free Pct: 33 % (ref >25)
PSA, Free: 0.43 ng/mL
PSA: 1.31 ng/mL (ref <=4.00)

## 2013-09-03 LAB — HM DIABETES EYE EXAM

## 2013-09-23 ENCOUNTER — Encounter: Payer: Self-pay | Admitting: Internal Medicine

## 2013-09-23 ENCOUNTER — Ambulatory Visit (INDEPENDENT_AMBULATORY_CARE_PROVIDER_SITE_OTHER): Payer: 59 | Admitting: Internal Medicine

## 2013-09-23 VITALS — BP 142/82 | HR 67 | Ht 69.0 in | Wt 199.2 lb

## 2013-09-23 DIAGNOSIS — I10 Essential (primary) hypertension: Secondary | ICD-10-CM

## 2013-09-23 DIAGNOSIS — E1169 Type 2 diabetes mellitus with other specified complication: Secondary | ICD-10-CM | POA: Insufficient documentation

## 2013-09-23 DIAGNOSIS — E119 Type 2 diabetes mellitus without complications: Secondary | ICD-10-CM

## 2013-09-23 DIAGNOSIS — E118 Type 2 diabetes mellitus with unspecified complications: Secondary | ICD-10-CM | POA: Insufficient documentation

## 2013-09-23 DIAGNOSIS — E785 Hyperlipidemia, unspecified: Secondary | ICD-10-CM

## 2013-09-23 LAB — HM DIABETES FOOT EXAM: HM Diabetic Foot Exam: NORMAL

## 2013-09-23 MED ORDER — ATORVASTATIN CALCIUM 20 MG PO TABS: 20.0000 mg | ORAL_TABLET | Freq: Every day | ORAL | Status: DC

## 2013-09-23 MED ORDER — METFORMIN HCL 500 MG PO TABS: 500.0000 mg | ORAL_TABLET | Freq: Two times a day (BID) | ORAL | Status: DC

## 2013-09-23 NOTE — Patient Instructions (Signed)
Please check blood pressure at home 2-3 times per week and email with readings.  Follow up in 6 months.

## 2013-09-23 NOTE — Assessment & Plan Note (Signed)
BP Readings from Last 3 Encounters:  09/23/13 142/82  06/08/13 142/80  05/20/13 160/100   BP slightly elevated in clinic, but better controlled at home. 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

## 2013-09-23 NOTE — Assessment & Plan Note (Signed)
Lipids and LFTs with labs today. Continue Atorvastatin. 

## 2013-09-23 NOTE — Progress Notes (Signed)
Pre visit review using our clinic review tool, if applicable. No additional management support is needed unless otherwise documented below in the visit note. 

## 2013-09-23 NOTE — Progress Notes (Signed)
Subjective:    Patient ID: Andrew Stevens, Andrew Stevens    DOB: [birth year], 53 y.o.   MRN: [id number]  HPI 53YO Andrew Stevens presents for follow up.  DM - BG running near 100. Compliant with metformin.  HTN - BP well controlled per report at home, but tends to be higher in physician office. Compliant with medication.  Review of Systems  Constitutional: Negative for fever, chills, activity change, appetite change, fatigue and unexpected weight change.  Eyes: Negative for visual disturbance.  Respiratory: Negative for cough and shortness of breath.   Cardiovascular: Negative for chest pain, palpitations and leg swelling.  Gastrointestinal: Negative for nausea, vomiting, abdominal pain, diarrhea, constipation and abdominal distention.  Genitourinary: Negative for dysuria, urgency and difficulty urinating.  Musculoskeletal: Negative for arthralgias and gait problem.  Skin: Negative for color change and rash.  Hematological: Negative for adenopathy.  Psychiatric/Behavioral: Negative for sleep disturbance and dysphoric mood. The patient is not nervous/anxious.        Objective:    BP 142/82  Pulse 67  Ht 5\' 9"  (1.753 m)  Wt 199 lb 4 oz (90.379 kg)  BMI 29.41 kg/m2  SpO2 98% Physical Exam  Constitutional: He is oriented to person, place, and time. He appears well-developed and well-nourished. No distress.  HENT:  Head: Normocephalic and atraumatic.  Right Ear: External ear normal.  Left Ear: External ear normal.  Nose: Nose normal.  Mouth/Throat: Oropharynx is clear and moist. No oropharyngeal exudate.  Eyes: Conjunctivae and EOM are normal. Pupils are equal, round, and reactive to light. Right eye exhibits no discharge. Left eye exhibits no discharge. No scleral icterus.  Neck: Normal range of motion. Neck supple. No tracheal deviation present. No thyromegaly present.  Cardiovascular: Normal rate, regular rhythm and normal heart sounds.  Exam reveals no gallop and no friction rub.   No  murmur heard. Pulmonary/Chest: Effort normal and breath sounds normal. No accessory muscle usage. Not tachypneic. No respiratory distress. He has no decreased breath sounds. He has no wheezes. He has no rhonchi. He has no rales. He exhibits no tenderness.  Musculoskeletal: Normal range of motion. He exhibits no edema.  Lymphadenopathy:    He has no cervical adenopathy.  Neurological: He is alert and oriented to person, place, and time. No cranial nerve deficit. Coordination normal.  Skin: Skin is warm and dry. No rash noted. He is not diaphoretic. No erythema. No pallor.  Psychiatric: He has a normal mood and affect. His behavior is normal. Judgment and thought content normal.          Assessment & Plan:   Problem List Items Addressed This Visit     Unprioritized   Hypertension      BP Readings from Last 3 Encounters:  09/23/13 142/82  06/08/13 142/80  05/20/13 160/100   BP slightly elevated in clinic, but better controlled at home. 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    Relevant Medications      atorvastatin (LIPITOR) tablet   Other and unspecified hyperlipidemia     Lipids and LFTs with labs today. Continue Atorvastatin.    Relevant Medications      atorvastatin (LIPITOR) tablet   Type II or unspecified type diabetes mellitus without mention of complication, not stated as uncontrolled - Primary     Will check A1c with labs today. Encouraged healthy diet and exercise. Continue metformin.    Relevant Medications  metFORMIN (GLUCOPHAGE) tablet      atorvastatin (LIPITOR) tablet   Other Relevant Orders      Comprehensive metabolic panel      Hemoglobin A1c      Lipid panel       Return in about 6 months (around 03/26/2014) for Recheck of Diabetes.

## 2013-09-23 NOTE — Assessment & Plan Note (Signed)
Will check A1c with labs today. Encouraged healthy diet and exercise. Continue metformin.

## 2013-09-24 ENCOUNTER — Encounter: Payer: Self-pay | Admitting: *Deleted

## 2013-09-24 LAB — LIPID PANEL
CHOL/HDL RATIO: 3
CHOLESTEROL: 141 mg/dL (ref 0–200)
HDL: 44.4 mg/dL (ref >39.00)
LDL Cholesterol: 83 mg/dL (ref 0–99)
NonHDL: 96.6
TRIGLYCERIDES: 70 mg/dL (ref 0.0–149.0)
VLDL: 14 mg/dL (ref 0.0–40.0)

## 2013-09-24 LAB — COMPREHENSIVE METABOLIC PANEL
ALT: 17 U/L (ref 0–53)
AST: 19 U/L (ref 0–37)
Albumin: 4.4 g/dL (ref 3.5–5.2)
Alkaline Phosphatase: 54 U/L (ref 39–117)
BUN: 16 mg/dL (ref 6–23)
CALCIUM: 9.3 mg/dL (ref 8.4–10.5)
CO2: 31 meq/L (ref 19–32)
CREATININE: 1.1 mg/dL (ref 0.4–1.5)
Chloride: 99 mEq/L (ref 96–112)
GFR: 73.9 mL/min (ref >60.00)
Glucose, Bld: 80 mg/dL (ref 70–99)
Potassium: 4.1 mEq/L (ref 3.5–5.1)
Sodium: 138 mEq/L (ref 135–145)
Total Bilirubin: 0.7 mg/dL (ref 0.2–1.2)
Total Protein: 7.9 g/dL (ref 6.0–8.3)

## 2013-09-24 LAB — HEMOGLOBIN A1C: Hgb A1c MFr Bld: 6.2 % (ref 4.6–6.5)

## 2013-10-13 ENCOUNTER — Telehealth: Payer: Self-pay | Admitting: *Deleted

## 2013-10-13 NOTE — Telephone Encounter (Signed)
VM left for pt about nurse visit BP recheck for DB 

## 2013-10-18 ENCOUNTER — Telehealth: Payer: Self-pay | Admitting: Internal Medicine

## 2013-10-18 NOTE — Telephone Encounter (Signed)
Let's have him come in for a nurse BP check

## 2013-10-18 NOTE — Telephone Encounter (Signed)
His wife has been checking his blood pressure.

## 2013-10-18 NOTE — Telephone Encounter (Signed)
The pt's wife called and left a voice message on the patient coordinator line explaining she has been checking the pt's blood pressure daily.  She stated the highest it has been is 140/90, however, he was anxious at the time this was taken.  She stated it has been within normal range while at home and at rest.   The wife's Verdis Frederickson) callback number  [phone]

## 2013-10-19 NOTE — Telephone Encounter (Signed)
Please call and schedule nurse visit with pt

## 2013-11-02 ENCOUNTER — Telehealth: Payer: Self-pay | Admitting: Internal Medicine

## 2013-11-02 NOTE — Telephone Encounter (Signed)
LMTCB to schedule nurse visit to check BP.[email or web address]

## 2013-11-11 NOTE — Telephone Encounter (Signed)
Can they send [place] some of his BP readings?

## 2013-11-11 NOTE — Telephone Encounter (Signed)
Please advise 

## 2013-11-11 NOTE — Telephone Encounter (Signed)
Left detailed message on VM to bring in BP readings

## 2013-11-11 NOTE — Telephone Encounter (Signed)
The patient's wife has been checking his BP and Blood sugar and all have been in normal ranges. His wife wants to know if he has to be seen if she is keeping check on his BP .

## 2013-11-11 NOTE — Telephone Encounter (Signed)
Please see below.

## 2013-12-10 ENCOUNTER — Ambulatory Visit: Payer: Self-pay | Admitting: Gastroenterology

## 2013-12-10 LAB — HM COLONOSCOPY

## 2013-12-16 ENCOUNTER — Encounter: Payer: Self-pay | Admitting: *Deleted

## 2014-01-04 ENCOUNTER — Encounter: Payer: Self-pay | Admitting: Internal Medicine

## 2014-01-11 ENCOUNTER — Ambulatory Visit (INDEPENDENT_AMBULATORY_CARE_PROVIDER_SITE_OTHER): Payer: 59 | Admitting: Internal Medicine

## 2014-01-11 ENCOUNTER — Encounter (INDEPENDENT_AMBULATORY_CARE_PROVIDER_SITE_OTHER): Payer: Self-pay

## 2014-01-11 ENCOUNTER — Encounter: Payer: Self-pay | Admitting: Internal Medicine

## 2014-01-11 VITALS — BP 144/89 | HR 89 | Temp 97.5°F | Ht 69.0 in | Wt 196.5 lb

## 2014-01-11 DIAGNOSIS — N528 Other male erectile dysfunction: Secondary | ICD-10-CM

## 2014-01-11 DIAGNOSIS — R1030 Lower abdominal pain, unspecified: Secondary | ICD-10-CM

## 2014-01-11 DIAGNOSIS — B372 Candidiasis of skin and nail: Secondary | ICD-10-CM

## 2014-01-11 HISTORY — DX: Lower abdominal pain, unspecified: R10.30

## 2014-01-11 LAB — POCT URINALYSIS DIPSTICK
Bilirubin, UA: NEGATIVE
GLUCOSE UA: NEGATIVE
Ketones, UA: NEGATIVE
Leukocytes, UA: NEGATIVE
Nitrite, UA: NEGATIVE
PH UA: 5.5
Protein, UA: NEGATIVE
Spec Grav, UA: 1.02
Urobilinogen, UA: 0.2

## 2014-01-11 MED ORDER — NYSTATIN 100000 UNIT/GM EX POWD: CUTANEOUS | Status: DC

## 2014-01-11 MED ORDER — TADALAFIL 5 MG PO TABS: 5.0000 mg | ORAL_TABLET | Freq: Every day | ORAL | Status: DC

## 2014-01-11 MED ORDER — TADALAFIL 5 MG PO TABS: 5.0000 mg | ORAL_TABLET | Freq: Every day | ORAL | Status: DC | PRN

## 2014-01-11 NOTE — Patient Instructions (Addendum)
Start Nystatin powder twice daily to groin area.  Start Cialis 5mg  daily to help with enlarged prostate and erectile dysfunction.  We will set up evaluation with Urology.  Follow up here in 4 weeks or sooner as needed.

## 2014-01-11 NOTE — Assessment & Plan Note (Signed)
Recent worsening of ED symptoms. Exam normal today, however describes some pressure in lower groin. Will set up urology evaluation. Start Cialis 5mg  daily. Follow up 4 weeks and prn.

## 2014-01-11 NOTE — Progress Notes (Signed)
Subjective:    Patient ID: Andrew Stevens, male    DOB: 10-19-1960, 53 y.o.   MRN: [id number]  HPI 53YO male presents for acute visit.  Groin pain - pain in both sides of lower groin described as pressure with intercourse over last 7-8 weeks. Unable to have erection at times or maintain erection. Also notes decreased urinary stream, with trickling stream. No blood in urine. Occasional pressure in testicles, but no drastic pain.  PSA in 05/2013 was normal.  Past medical, surgical, family and social history per today's encounter.  Review of Systems  Constitutional: Negative for fever, chills, activity change, appetite change, fatigue and unexpected weight change.  Eyes: Negative for visual disturbance.  Respiratory: Negative for cough and shortness of breath.   Cardiovascular: Negative for chest pain, palpitations and leg swelling.  Gastrointestinal: Negative for nausea, vomiting, abdominal pain, diarrhea, constipation and abdominal distention.  Genitourinary: Positive for frequency, decreased urine volume, difficulty urinating and testicular pain (occasional pressure). Negative for dysuria, urgency, hematuria, flank pain, discharge, penile swelling, scrotal swelling, enuresis and penile pain.  Musculoskeletal: Negative for arthralgias and gait problem.  Skin: Negative for color change and rash.  Hematological: Negative for adenopathy.  Psychiatric/Behavioral: Negative for sleep disturbance and dysphoric mood. The patient is not nervous/anxious.        Objective:    BP 144/89 mmHg  Pulse 89  Temp(Src) 97.5 F (36.4 C) (Oral)  Ht 5\' 9"  (1.753 m)  Wt 196 lb 8 oz (89.132 kg)  BMI 29.00 kg/m2  SpO2 99% Physical Exam  Constitutional: He is oriented to person, place, and time. He appears well-developed and well-nourished. No distress.  HENT:  Head: Normocephalic.  Eyes: Pupils are equal, round, and reactive to light.  Neck: Normal range of motion.  Genitourinary: Rectum normal,  testes normal and penis normal. Rectal exam shows no mass, no tenderness and anal tone normal. Prostate is enlarged (slight). Prostate is not tender.  Musculoskeletal: Normal range of motion.  Neurological: He is alert and oriented to person, place, and time. He has normal reflexes.  Skin: Skin is warm. Rash (maculopapular over inferior scrotum and surrounding skin) noted. He is not diaphoretic. There is erythema.  Psychiatric: He has a normal mood and affect. His behavior is normal. Judgment and thought content normal.          Assessment & Plan:   Problem List Items Addressed This Visit      Unprioritized   Candidal dermatitis    Start topical Nystatin powder. If no improvement, plan to start oral Diflucan.    Relevant Medications      Nystatin (MYCOSTATIN) 100,000 units/Gm top powder   Erectile dysfunction    Recent worsening of ED symptoms. Exam normal today, however describes some pressure in lower groin. Will set up urology evaluation. Start Cialis 5mg  daily. Follow up 4 weeks and prn.    Relevant Medications      tadalafil (CIALIS) tablet   Groin pain - Primary    Unclear etiology of bilateral groin pain. Exam is normal. Will send urine to check for UTI, however no symptoms to suggest this. No exam findings to suggest prostatitis. Will set up urology evaluation. Question if imaging of prostate/lower pelvis might be helpful to determine cause of pressure symptoms.    Relevant Orders      POCT Urinalysis Dipstick      Ambulatory referral to Urology       Return in about 4 weeks (around 02/08/2014).

## 2014-01-11 NOTE — Assessment & Plan Note (Signed)
Unclear etiology of bilateral groin pain. Exam is normal. Will send urine to check for UTI, however no symptoms to suggest this. No exam findings to suggest prostatitis. Will set up urology evaluation. Question if imaging of prostate/lower pelvis might be helpful to determine cause of pressure symptoms.

## 2014-01-11 NOTE — Progress Notes (Signed)
Pre visit review using our clinic review tool, if applicable. No additional management support is needed unless otherwise documented below in the visit note. 

## 2014-01-11 NOTE — Assessment & Plan Note (Signed)
Start topical Nystatin powder. If no improvement, plan to start oral Diflucan.

## 2014-01-11 NOTE — Addendum Note (Signed)
Addended by: Karlene Einstein D on: 01/11/2014 01:51 PM   Modules accepted: Orders

## 2014-01-12 ENCOUNTER — Encounter: Payer: Self-pay | Admitting: Internal Medicine

## 2014-01-13 LAB — CULTURE, URINE COMPREHENSIVE
Colony Count: NO GROWTH
Organism ID, Bacteria: NO GROWTH

## 2014-02-18 ENCOUNTER — Ambulatory Visit: Payer: Self-pay | Admitting: Internal Medicine

## 2014-03-29 ENCOUNTER — Ambulatory Visit: Payer: Self-pay | Admitting: Internal Medicine

## 2014-04-04 ENCOUNTER — Ambulatory Visit (INDEPENDENT_AMBULATORY_CARE_PROVIDER_SITE_OTHER): Payer: 59 | Admitting: Internal Medicine

## 2014-04-04 ENCOUNTER — Encounter: Payer: Self-pay | Admitting: Internal Medicine

## 2014-04-04 ENCOUNTER — Encounter: Payer: Self-pay | Admitting: *Deleted

## 2014-04-04 VITALS — BP 152/87 | HR 71 | Temp 97.9°F | Ht 69.0 in | Wt 201.5 lb

## 2014-04-04 DIAGNOSIS — N528 Other male erectile dysfunction: Secondary | ICD-10-CM

## 2014-04-04 DIAGNOSIS — R1030 Lower abdominal pain, unspecified: Secondary | ICD-10-CM

## 2014-04-04 DIAGNOSIS — I10 Essential (primary) hypertension: Secondary | ICD-10-CM

## 2014-04-04 DIAGNOSIS — E785 Hyperlipidemia, unspecified: Secondary | ICD-10-CM

## 2014-04-04 DIAGNOSIS — E119 Type 2 diabetes mellitus without complications: Secondary | ICD-10-CM

## 2014-04-04 LAB — COMPREHENSIVE METABOLIC PANEL
ALK PHOS: 55 U/L (ref 39–117)
ALT: 18 U/L (ref 0–53)
AST: 20 U/L (ref 0–37)
Albumin: 4.3 g/dL (ref 3.5–5.2)
BUN: 16 mg/dL (ref 6–23)
CALCIUM: 9.4 mg/dL (ref 8.4–10.5)
CO2: 32 mEq/L (ref 19–32)
CREATININE: 0.95 mg/dL (ref 0.40–1.50)
Chloride: 101 mEq/L (ref 96–112)
GFR: 87.35 mL/min (ref >60.00)
Glucose, Bld: 124 mg/dL — ABNORMAL HIGH (ref 70–99)
Potassium: 4.4 mEq/L (ref 3.5–5.1)
Sodium: 138 mEq/L (ref 135–145)
Total Bilirubin: 0.8 mg/dL (ref 0.2–1.2)
Total Protein: 7.5 g/dL (ref 6.0–8.3)

## 2014-04-04 LAB — LIPID PANEL
CHOLESTEROL: 127 mg/dL (ref 0–200)
HDL: 45.2 mg/dL (ref >39.00)
LDL Cholesterol: 71 mg/dL (ref 0–99)
NonHDL: 81.8
TRIGLYCERIDES: 53 mg/dL (ref 0.0–149.0)
Total CHOL/HDL Ratio: 3
VLDL: 10.6 mg/dL (ref 0.0–40.0)

## 2014-04-04 LAB — MICROALBUMIN / CREATININE URINE RATIO
Creatinine,U: 155.3 mg/dL
Microalb Creat Ratio: 1.2 mg/g (ref 0.0–30.0)
Microalb, Ur: 1.9 mg/dL (ref 0.0–1.9)

## 2014-04-04 LAB — HEMOGLOBIN A1C: Hgb A1c MFr Bld: 6.3 % (ref 4.6–6.5)

## 2014-04-04 MED ORDER — TADALAFIL 5 MG PO TABS: 5.0000 mg | ORAL_TABLET | Freq: Every day | ORAL | Status: DC

## 2014-04-04 MED ORDER — AMLODIPINE BESYLATE 2.5 MG PO TABS: 2.5000 mg | ORAL_TABLET | Freq: Every day | ORAL | Status: DC

## 2014-04-04 MED ORDER — METFORMIN HCL 500 MG PO TABS: 500.0000 mg | ORAL_TABLET | Freq: Two times a day (BID) | ORAL | Status: DC

## 2014-04-04 MED ORDER — ATORVASTATIN CALCIUM 20 MG PO TABS: 20.0000 mg | ORAL_TABLET | Freq: Every day | ORAL | Status: DC

## 2014-04-04 NOTE — Assessment & Plan Note (Signed)
Symptoms improved with Cialis. Reviewed notes from Urology. Follow up prn.

## 2014-04-04 NOTE — Assessment & Plan Note (Signed)
Will check lipids with labs. Continue Atorvastatin. 

## 2014-04-04 NOTE — Progress Notes (Signed)
Subjective:    Patient ID: Andrew Stevens, male    DOB: 29-Sep-1960, 54 y.o.   MRN: [id number]  HPI  54YO male presents for follow up.  Last seen 12/15 for groin pain. Seen by Urology. Started on Cialis to help with ED. Groin pain resolved.  HTN - BP at home 135/70. No chest pain, headache. Compliant with meds.  BP Readings from Last 3 Encounters:  04/04/14 152/87  01/11/14 144/89  09/23/13 142/82   DM - BG 112 at home this morning. Mostly low 100s. Compliant with metformin.  Past medical, surgical, family and social history per today's encounter.  Review of Systems  Constitutional: Negative for fever, chills, activity change, appetite change, fatigue and unexpected weight change.  Eyes: Negative for visual disturbance.  Respiratory: Negative for cough and shortness of breath.   Cardiovascular: Negative for chest pain, palpitations and leg swelling.  Gastrointestinal: Negative for nausea, vomiting, abdominal pain, diarrhea, constipation and abdominal distention.  Genitourinary: Negative for dysuria, urgency and difficulty urinating.  Musculoskeletal: Negative for myalgias, arthralgias and gait problem.  Skin: Negative for color change and rash.  Neurological: Negative for dizziness and headaches.  Hematological: Negative for adenopathy.  Psychiatric/Behavioral: Negative for sleep disturbance and dysphoric mood. The patient is not nervous/anxious.        Objective:    BP 152/87 mmHg  Pulse 71  Temp(Src) 97.9 F (36.6 C) (Oral)  Ht 5\' 9"  (1.753 m)  Wt 201 lb 8 oz (91.4 kg)  BMI 29.74 kg/m2  SpO2 100% Physical Exam  Constitutional: He is oriented to person, place, and time. He appears well-developed and well-nourished. No distress.  HENT:  Head: Normocephalic and atraumatic.  Right Ear: External ear normal.  Left Ear: External ear normal.  Nose: Nose normal.  Mouth/Throat: Oropharynx is clear and moist. No oropharyngeal exudate.  Eyes: Conjunctivae and EOM are  normal. Pupils are equal, round, and reactive to light. Right eye exhibits no discharge. Left eye exhibits no discharge. No scleral icterus.  Neck: Normal range of motion. Neck supple. No tracheal deviation present. No thyromegaly present.  Cardiovascular: Normal rate, regular rhythm and normal heart sounds.  Exam reveals no gallop and no friction rub.   No murmur heard. Pulmonary/Chest: Effort normal and breath sounds normal. No accessory muscle usage. No tachypnea. No respiratory distress. He has no decreased breath sounds. He has no wheezes. He has no rhonchi. He has no rales. He exhibits no tenderness.  Musculoskeletal: Normal range of motion. He exhibits no edema.  Lymphadenopathy:    He has no cervical adenopathy.  Neurological: He is alert and oriented to person, place, and time. No cranial nerve deficit. Coordination normal.  Skin: Skin is warm and dry. No rash noted. He is not diaphoretic. No erythema. No pallor.  Psychiatric: He has a normal mood and affect. His behavior is normal. Judgment and thought content normal.          Assessment & Plan:   Problem List Items Addressed This Visit      Unprioritized   Diabetes type 2, controlled - Primary    Will check A1c with labs. Encouraged healthy diet and exercise. Continue Metformin. He will look into getting Pneumovax at his work.      Relevant Medications   metFORMIN (GLUCOPHAGE) tablet   atorvastatin (LIPITOR) tablet   Other Relevant Orders   Comprehensive metabolic panel   Hemoglobin A1c   Lipid panel   Microalbumin / creatinine urine ratio   Erectile dysfunction  Symptoms improved with Cialis. Reviewed notes from Urology. Follow up prn.      Relevant Medications   tadalafil (CIALIS) tablet   Groin pain    Symptoms have resolved. Will monitor for any recurrence.      Hyperlipidemia    Will check lipids with labs. Continue Atorvastatin.      Relevant Medications   atorvastatin (LIPITOR) tablet   tadalafil  (CIALIS) tablet   amLODIpine (NORVASC)  tablet   Hypertension    BP Readings from Last 3 Encounters:  04/04/14 152/87  01/11/14 144/89  09/23/13 142/82   BP continues to be elevated. Will add Amlodipine 2.5mg  daily. Recheck BP in 4 weeks. Renal function with labs.      Relevant Medications   atorvastatin (LIPITOR) tablet   tadalafil (CIALIS) tablet   amLODIpine (NORVASC)  tablet       Return in about 4 weeks (around 05/02/2014) for Recheck of Blood Pressure.

## 2014-04-04 NOTE — Assessment & Plan Note (Signed)
Symptoms have resolved. Will monitor for any recurrence.

## 2014-04-04 NOTE — Assessment & Plan Note (Signed)
BP Readings from Last 3 Encounters:  04/04/14 152/87  01/11/14 144/89  09/23/13 142/82   BP continues to be elevated. Will add Amlodipine 2.5mg  daily. Recheck BP in 4 weeks. Renal function with labs.

## 2014-04-04 NOTE — Assessment & Plan Note (Addendum)
Will check A1c with labs. Encouraged healthy diet and exercise. Continue Metformin. He will look into getting Pneumovax at his work.

## 2014-04-04 NOTE — Patient Instructions (Addendum)
Start Amlodipine 2.5mg  daily. Follow up blood pressure check in 4 weeks.     Why follow it? Research shows. . Those who follow the Mediterranean diet have a reduced risk of heart disease  . The diet is associated with a reduced incidence of Parkinson's and Alzheimer's diseases . People following the diet may have longer life expectancies and lower rates of chronic diseases  . The Dietary Guidelines for Americans recommends the Mediterranean diet as an eating plan to promote health and prevent disease  What Is the Mediterranean Diet?  . Healthy eating plan based on typical foods and recipes of Mediterranean-style cooking . The diet is primarily a plant based diet; these foods should make up a majority of meals   Starches - Plant based foods should make up a majority of meals - They are an important sources of vitamins, minerals, energy, antioxidants, and fiber - Choose whole grains, foods high in fiber and minimally processed items  - Typical grain sources include wheat, oats, barley, corn, brown rice, bulgar, farro, millet, polenta, couscous  - Various types of beans include chickpeas, lentils, fava beans, black beans, white beans   Fruits  Veggies - Large quantities of antioxidant rich fruits & veggies; 6 or more servings  - Vegetables can be eaten raw or lightly drizzled with oil and cooked  - Vegetables common to the traditional Mediterranean Diet include: artichokes, arugula, beets, broccoli, brussel sprouts, cabbage, carrots, celery, collard greens, cucumbers, eggplant, kale, leeks, lemons, lettuce, mushrooms, okra, onions, peas, peppers, potatoes, pumpkin, radishes, rutabaga, shallots, spinach, sweet potatoes, turnips, zucchini - Fruits common to the Mediterranean Diet include: apples, apricots, avocados, cherries, clementines, dates, figs, grapefruits, grapes, melons, nectarines, oranges, peaches, pears, pomegranates, strawberries, tangerines  Fats - Replace butter and margarine  with healthy oils, such as olive oil, canola oil, and tahini  - Limit nuts to no more than a handful a day  - Nuts include walnuts, almonds, pecans, pistachios, pine nuts  - Limit or avoid candied, honey roasted or heavily salted nuts - Olives are central to the [employer] - can be eaten whole or used in a variety of dishes   Meats Protein - Limiting red meat: no more than a few times a month - When eating red meat: choose lean cuts and keep the portion to the size of deck of cards - Eggs: approx. 0 to 4 times a week  - Fish and lean poultry: at least 2 a week  - Healthy protein sources include, chicken, [place], lean beef, lamb - Increase intake of seafood such as tuna, salmon, trout, mackerel, shrimp, scallops - Avoid or limit high fat processed meats such as sausage and bacon  Dairy - Include moderate amounts of low fat dairy products  - Focus on healthy dairy such as fat free yogurt, skim milk, low or reduced fat cheese - Limit dairy products higher in fat such as whole or 2% milk, cheese, ice cream  Alcohol - Moderate amounts of red wine is ok  - No more than 5 oz daily for women (all ages) and men older than age 43  - No more than 10 oz of wine daily for men younger than 13  Other - Limit sweets and other desserts  - Use herbs and spices instead of salt to flavor foods  - Herbs and spices common to the traditional Mediterranean Diet include: basil, bay leaves, chives, cloves, cumin, fennel, garlic, lavender, marjoram, mint, oregano, parsley, pepper, rosemary, sage, savory, sumac, tarragon, thyme  It's not just a diet, it's a lifestyle:  . The Mediterranean diet includes lifestyle factors typical of those in the region  . Foods, drinks and meals are best eaten with others and savored . Daily physical activity is important for overall good health . This could be strenuous exercise like running and aerobics . This could also be more leisurely activities such as walking,  housework, yard-work, or taking the stairs . Moderation is the key; a balanced and healthy diet accommodates most foods and drinks . Consider portion sizes and frequency of consumption of certain foods   Meal Ideas & Options:  . Breakfast:  o Whole wheat toast or whole wheat English muffins with peanut butter & hard boiled egg o Steel cut oats topped with apples & cinnamon and skim milk  o Fresh fruit: banana, strawberries, melon, berries, peaches  o Smoothies: strawberries, bananas, greek yogurt, peanut butter o Low fat greek yogurt with blueberries and granola  o Egg white omelet with spinach and mushrooms o Breakfast couscous: whole wheat couscous, apricots, skim milk, cranberries  . Sandwiches:  o Hummus and grilled vegetables (peppers, zucchini, squash) on whole wheat bread   o Grilled chicken on whole wheat pita with lettuce, tomatoes, cucumbers or tzatziki  o Tuna salad on whole wheat bread: tuna salad made with greek yogurt, olives, red peppers, capers, green onions o Garlic rosemary lamb pita: lamb sauted with garlic, rosemary, salt & pepper; add lettuce, cucumber, greek yogurt to pita - flavor with lemon juice and black pepper  . Seafood:  o Mediterranean grilled salmon, seasoned with garlic, basil, parsley, lemon juice and black pepper o Shrimp, lemon, and spinach whole-grain pasta salad made with low fat greek yogurt  o Seared scallops with lemon orzo  o Seared tuna steaks seasoned salt, pepper, coriander topped with tomato mixture of olives, tomatoes, olive oil, minced garlic, parsley, green onions and cappers  . Meats:  o Herbed greek chicken salad with kalamata olives, cucumber, feta  o Red bell peppers stuffed with spinach, bulgur, lean ground beef (or lentils) & topped with feta   o Kebabs: skewers of chicken, tomatoes, onions, zucchini, squash  o [place] burgers: made with red onions, mint, dill, lemon juice, feta cheese topped with roasted red  peppers . Vegetarian o Cucumber salad: cucumbers, artichoke hearts, celery, red onion, feta cheese, tossed in olive oil & lemon juice  o Hummus and whole grain pita points with a greek salad (lettuce, tomato, feta, olives, cucumbers, red onion) o Lentil soup with celery, carrots made with vegetable broth, garlic, salt and pepper  o Tabouli salad: parsley, bulgur, mint, scallions, cucumbers, tomato, radishes, lemon juice, olive oil, salt and pepper.

## 2014-04-04 NOTE — Progress Notes (Signed)
Pre visit review using our clinic review tool, if applicable. No additional management support is needed unless otherwise documented below in the visit note. 

## 2014-05-04 ENCOUNTER — Ambulatory Visit (INDEPENDENT_AMBULATORY_CARE_PROVIDER_SITE_OTHER): Payer: 59 | Admitting: Internal Medicine

## 2014-05-04 ENCOUNTER — Encounter: Payer: Self-pay | Admitting: Internal Medicine

## 2014-05-04 VITALS — BP 157/92 | HR 65 | Temp 97.8°F | Ht 69.0 in | Wt 200.0 lb

## 2014-05-04 DIAGNOSIS — I10 Essential (primary) hypertension: Secondary | ICD-10-CM

## 2014-05-04 MED ORDER — AMLODIPINE BESYLATE 5 MG PO TABS: 5.0000 mg | ORAL_TABLET | Freq: Every day | ORAL | Status: DC

## 2014-05-04 NOTE — Progress Notes (Signed)
   Subjective:    Patient ID: Andrew Stevens, male    DOB: 11-16-1960, 54 y.o.   MRN: [id number]  HPI 54YO male presents for BP check.  Last visit 3/7, added Amlodipine 2.5mg  daily.  Following healthy, low salt diet. Exercising by walking. Compliant with meds. No CP, HA.  Wt Readings from Last 3 Encounters:  05/04/14 200 lb (90.719 kg)  04/04/14 201 lb 8 oz (91.4 kg)  01/11/14 196 lb 8 oz (89.132 kg)   BP Readings from Last 3 Encounters:  05/04/14 157/92  04/04/14 152/87  01/11/14 144/89     Past medical, surgical, family and social history per today's encounter.  Review of Systems  Constitutional: Negative for fever, chills, activity change, appetite change, fatigue and unexpected weight change.  Eyes: Negative for visual disturbance.  Respiratory: Negative for cough and shortness of breath.   Cardiovascular: Negative for chest pain, palpitations and leg swelling.  Gastrointestinal: Negative for nausea, vomiting, abdominal pain, diarrhea, constipation and abdominal distention.  Genitourinary: Negative for dysuria, urgency and difficulty urinating.  Musculoskeletal: Negative for myalgias, arthralgias and gait problem.  Skin: Negative for color change and rash.  Neurological: Negative for headaches.  Hematological: Negative for adenopathy.  Psychiatric/Behavioral: Negative for sleep disturbance and dysphoric mood. The patient is not nervous/anxious.        Objective:    BP 157/92 mmHg  Pulse 65  Temp(Src) 97.8 F (36.6 C) (Oral)  Ht 5\' 9"  (1.753 m)  Wt 200 lb (90.719 kg)  BMI 29.52 kg/m2  SpO2 100% Physical Exam  Constitutional: He is oriented to person, place, and time. He appears well-developed and well-nourished. No distress.  HENT:  Head: Normocephalic and atraumatic.  Right Ear: External ear normal.  Left Ear: External ear normal.  Nose: Nose normal.  Mouth/Throat: Oropharynx is clear and moist.  Eyes: Conjunctivae and EOM are normal. Pupils are equal,  round, and reactive to light. Right eye exhibits no discharge. Left eye exhibits no discharge. No scleral icterus.  Neck: Normal range of motion. Neck supple. No tracheal deviation present. No thyromegaly present.  Cardiovascular: Normal rate, regular rhythm and normal heart sounds.  Exam reveals no gallop and no friction rub.   No murmur heard. Pulmonary/Chest: Effort normal and breath sounds normal. No accessory muscle usage. No tachypnea. No respiratory distress. He has no decreased breath sounds. He has no wheezes. He has no rhonchi. He has no rales. He exhibits no tenderness.  Musculoskeletal: Normal range of motion. He exhibits no edema.  Lymphadenopathy:    He has no cervical adenopathy.  Neurological: He is alert and oriented to person, place, and time. No cranial nerve deficit. Coordination normal.  Skin: Skin is warm and dry. No rash noted. He is not diaphoretic. No erythema. No pallor.  Psychiatric: He has a normal mood and affect. His behavior is normal. Judgment and thought content normal.          Assessment & Plan:   Problem List Items Addressed This Visit      Unprioritized   Hypertension - Primary    BP Readings from Last 3 Encounters:  05/04/14 157/92  04/04/14 152/87  01/11/14 144/89   BP continues to be elevated. Will increase Amlodipine to 5mg  daily. Continue Micardis. Follow up recheck in 2 weeks.      Relevant Medications   amLODIpine (NORVASC) tablet       Return in about 2 weeks (around 05/18/2014) for Recheck of Blood Pressure.

## 2014-05-04 NOTE — Progress Notes (Signed)
Pre visit review using our clinic review tool, if applicable. No additional management support is needed unless otherwise documented below in the visit note. 

## 2014-05-04 NOTE — Patient Instructions (Signed)
Increase Amlodipine to 5mg  daily.  Recheck BP in 2 weeks.

## 2014-05-04 NOTE — Assessment & Plan Note (Signed)
BP Readings from Last 3 Encounters:  05/04/14 157/92  04/04/14 152/87  01/11/14 144/89   BP continues to be elevated. Will increase Amlodipine to 5mg  daily. Continue Micardis. Follow up recheck in 2 weeks.

## 2014-05-18 ENCOUNTER — Ambulatory Visit (INDEPENDENT_AMBULATORY_CARE_PROVIDER_SITE_OTHER): Payer: 59 | Admitting: *Deleted

## 2014-05-18 VITALS — BP 140/86 | HR 89

## 2014-05-18 DIAGNOSIS — I10 Essential (primary) hypertension: Secondary | ICD-10-CM | POA: Diagnosis not present

## 2014-05-18 NOTE — Progress Notes (Signed)
Left msg on cell number per pt request, notifying of message and to call back to schedule one month follow up

## 2014-05-18 NOTE — Progress Notes (Signed)
Pt presents for BP check. Doing well without complaints. Taking Amlodipine 5 mg daily and Micardis HCT 80/12.5mg  daily. BP 140/86, HR 89. Dr. Gilford Rile notified. Advised pt to continue current treatment and that I would call with any changes and when Dr. Gilford Rile would like to see him back again, no upcoming appt scheduled.  May call [phone] (wife) with information.  Verbalized understanding

## 2014-05-18 NOTE — Progress Notes (Signed)
Continue current medication. Follow up in 1 month.

## 2014-06-17 ENCOUNTER — Other Ambulatory Visit: Payer: Self-pay | Admitting: Internal Medicine

## 2015-01-27 ENCOUNTER — Other Ambulatory Visit: Payer: Self-pay | Admitting: Internal Medicine

## 2015-02-27 ENCOUNTER — Other Ambulatory Visit: Payer: Self-pay | Admitting: Internal Medicine

## 2015-03-03 ENCOUNTER — Encounter: Payer: Self-pay | Admitting: Internal Medicine

## 2015-03-03 ENCOUNTER — Ambulatory Visit (INDEPENDENT_AMBULATORY_CARE_PROVIDER_SITE_OTHER): Payer: 59 | Admitting: Internal Medicine

## 2015-03-03 VITALS — BP 162/86 | HR 72 | Temp 98.0°F | Ht 69.0 in | Wt 205.4 lb

## 2015-03-03 DIAGNOSIS — I10 Essential (primary) hypertension: Secondary | ICD-10-CM

## 2015-03-03 DIAGNOSIS — E119 Type 2 diabetes mellitus without complications: Secondary | ICD-10-CM | POA: Diagnosis not present

## 2015-03-03 DIAGNOSIS — E785 Hyperlipidemia, unspecified: Secondary | ICD-10-CM

## 2015-03-03 NOTE — Progress Notes (Signed)
Subjective:    Patient ID: Andrew Stevens, male    DOB: [birth year], 55 y.o.   MRN: [id number]  HPI  55YO male presents for follow up.  HTN - Out of BP meds last 3 days. Restarted today. Very busy at work. Getting home late, has not been exercising. Eating out for dinner mostly. Some increased heartburn symptoms. Improved with Tums.  DM - Not checking BG. No polyuria or polyphagia. Compliant with metformin.  HL - Compliant with Atorvastin. No side effects noted.  Wt Readings from Last 3 Encounters:  03/03/15 205 lb 6 oz (93.157 kg)  05/04/14 200 lb (90.719 kg)  04/04/14 201 lb 8 oz (91.4 kg)   BP Readings from Last 3 Encounters:  03/03/15 162/86  05/18/14 140/86  05/04/14 157/92    Past Medical History  Diagnosis Date  . Hypertension    Family History  Problem Relation Age of Onset  . Hypertension Mother   . Rheum arthritis Mother   . Alcohol abuse Brother   . Colon cancer Neg Hx   . Prostate cancer Neg Hx   . Heart attack Sister   . Early death Sister     During childbirth  . Thyroid disease Sister    Past Surgical History  Procedure Laterality Date  . Ventral hernia repair    . Inguinal hernia repair    . Brain surgery  1989    Cyst removed - benign  . Rotator cuff repair Right 2013  . Rotator cuff repair Right 2014    Dr. Mack Guise   Social History   Social History  . Marital Status: Married    Spouse Name: N/A  . Number of Children: 2  . Years of Education: N/A   Occupational History  . 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 Smoking status: Never Smoker   . Smokeless tobacco: Never Used  . Alcohol Use: 0.0 oz/week    0 Standard drinks or equivalent per week     Comment: Occasional  . Drug Use: No  . Sexual Activity: Not Asked   Other Topics Concern  . None   Social History Narrative   Lives in [place] with wife. Dog. Hobbies - Football.      3 grandchildren      Regular Exercise -  NO   Daily Caffeine Use:  4 cups  coffee in AM, 1 cup tea in AM             Review of Systems  Constitutional: Negative for fever, chills, activity change, appetite change, fatigue and unexpected weight change.  Eyes: Negative for visual disturbance.  Respiratory: Negative for cough and shortness of breath.   Cardiovascular: Negative for chest pain, palpitations and leg swelling.  Gastrointestinal: Negative for nausea, vomiting, abdominal pain, diarrhea, constipation and abdominal distention.  Genitourinary: Negative for dysuria, urgency and difficulty urinating.  Musculoskeletal: Negative for arthralgias and gait problem.  Skin: Negative for color change and rash.  Hematological: Negative for adenopathy.  Psychiatric/Behavioral: Negative for sleep disturbance and dysphoric mood. The patient is not nervous/anxious.        Objective:    BP 162/86 mmHg  Pulse 72  Temp(Src) 98 F (36.7 C) (Oral)  Ht 5\' 9"  (1.753 m)  Wt 205 lb 6 oz (93.157 kg)  BMI 30.31 kg/m2  SpO2 100% Physical Exam  Constitutional: He is oriented to person, place, and time. He appears well-developed and well-nourished. No distress.  HENT:  Head: Normocephalic and atraumatic.  Right Ear: External ear normal.  Left Ear: External ear normal.  Nose: Nose normal.  Mouth/Throat: Oropharynx is clear and moist. No oropharyngeal exudate.  Eyes: Conjunctivae and EOM are normal. Pupils are equal, round, and reactive to light. Right eye exhibits no discharge. Left eye exhibits no discharge. No scleral icterus.  Neck: Normal range of motion. Neck supple. No tracheal deviation present. No thyromegaly present.  Cardiovascular: Normal rate, regular rhythm and normal heart sounds.  Exam reveals no gallop and no friction rub.   No murmur heard. Pulmonary/Chest: Effort normal and breath sounds normal. No accessory muscle usage. No tachypnea. No respiratory distress. He has no decreased breath sounds. He has no wheezes. He has no rhonchi. He has no rales. He  exhibits no tenderness.  Musculoskeletal: Normal range of motion. He exhibits no edema.  Lymphadenopathy:    He has no cervical adenopathy.  Neurological: He is alert and oriented to person, place, and time. No cranial nerve deficit. Coordination normal.  Skin: Skin is warm and dry. No rash noted. He is not diaphoretic. No erythema. No pallor.  Psychiatric: He has a normal mood and affect. His behavior is normal. Judgment and thought content normal.          Assessment & Plan:   Problem List Items Addressed This Visit      Unprioritized   Diabetes type 2, controlled ([place]) - Primary    Will check A1c with labs. Continue Metformin.      Relevant Orders   Comprehensive metabolic panel   Hemoglobin A1c   Lipid panel   Microalbumin / creatinine urine ratio   Hyperlipidemia    Lipids and LFTs with labs today. Continue Atorvastatin.      Hypertension    BP Readings from Last 3 Encounters:  03/03/15 162/86  05/18/14 140/86  05/04/14 157/92   BP elevated but out of meds last few days. Will check renal function. Restart meds. Recheck BP in 1 week.          Return in about 1 week (around 03/10/2015) for Recheck of Blood Pressure.

## 2015-03-03 NOTE — Assessment & Plan Note (Signed)
BP Readings from Last 3 Encounters:  03/03/15 162/86  05/18/14 140/86  05/04/14 157/92   BP elevated but out of meds last few days. Will check renal function. Restart meds. Recheck BP in 1 week.

## 2015-03-03 NOTE — Assessment & Plan Note (Signed)
Will check A1c with labs. Continue Metformin. 

## 2015-03-03 NOTE — Progress Notes (Signed)
Pre visit review using our clinic review tool, if applicable. No additional management support is needed unless otherwise documented below in the visit note. 

## 2015-03-03 NOTE — Assessment & Plan Note (Signed)
Lipids and LFTs with labs today. Continue Atorvastatin. 

## 2015-03-03 NOTE — Patient Instructions (Addendum)
Labs today.  Follow up in 1 week for blood pressure recheck.

## 2015-03-04 LAB — COMPREHENSIVE METABOLIC PANEL
ALT: 18 U/L (ref 9–46)
AST: 20 U/L (ref 10–35)
Albumin: 4.3 g/dL (ref 3.6–5.1)
Alkaline Phosphatase: 59 U/L (ref 40–115)
BUN: 15 mg/dL (ref 7–25)
CHLORIDE: 102 mmol/L (ref 98–110)
CO2: 30 mmol/L (ref 20–31)
CREATININE: 1.02 mg/dL (ref 0.70–1.33)
Calcium: 9 mg/dL (ref 8.6–10.3)
Glucose, Bld: 82 mg/dL (ref 65–99)
Potassium: 5.3 mmol/L (ref 3.5–5.3)
SODIUM: 140 mmol/L (ref 135–146)
TOTAL PROTEIN: 7.2 g/dL (ref 6.1–8.1)
Total Bilirubin: 0.5 mg/dL (ref 0.2–1.2)

## 2015-03-04 LAB — LIPID PANEL
CHOL/HDL RATIO: 3.4 ratio (ref <=5.0)
Cholesterol: 165 mg/dL (ref 125–200)
HDL: 49 mg/dL (ref >=40)
LDL CALC: 89 mg/dL (ref <130)
Triglycerides: 137 mg/dL (ref <150)
VLDL: 27 mg/dL (ref <30)

## 2015-03-04 LAB — MICROALBUMIN / CREATININE URINE RATIO
CREATININE, URINE: 80 mg/dL (ref 20–370)
Microalb Creat Ratio: 11 mcg/mg creat (ref <30)
Microalb, Ur: 0.9 mg/dL

## 2015-03-04 LAB — HEMOGLOBIN A1C
HEMOGLOBIN A1C: 6.2 % — AB (ref <5.7)
Mean Plasma Glucose: 131 mg/dL — ABNORMAL HIGH (ref <117)

## 2015-03-09 ENCOUNTER — Ambulatory Visit (INDEPENDENT_AMBULATORY_CARE_PROVIDER_SITE_OTHER): Payer: 59 | Admitting: *Deleted

## 2015-03-09 VITALS — BP 136/80 | HR 80

## 2015-03-09 DIAGNOSIS — I10 Essential (primary) hypertension: Secondary | ICD-10-CM | POA: Diagnosis not present

## 2015-03-09 NOTE — Progress Notes (Signed)
OK. Continue current medication and follow up as scheduled.

## 2015-03-09 NOTE — Progress Notes (Signed)
Patient presented in the office today for a blood pressure check. Blood pressure taken on the left arm using the automated machine with a reading of 159/80 and a pulse of 76.  Repeated blood pressure after waiting 5 minutes with a reading of 136/80 and a pulse of 80.

## 2015-06-07 ENCOUNTER — Other Ambulatory Visit: Payer: Self-pay | Admitting: Internal Medicine

## 2015-07-28 ENCOUNTER — Other Ambulatory Visit: Payer: Self-pay | Admitting: Internal Medicine

## 2015-08-22 ENCOUNTER — Ambulatory Visit: Payer: Self-pay | Admitting: Internal Medicine

## 2015-11-03 ENCOUNTER — Telehealth: Payer: Self-pay | Admitting: Internal Medicine

## 2015-11-03 MED ORDER — AMLODIPINE BESYLATE 5 MG PO TABS: 5.0000 mg | ORAL_TABLET | Freq: Every day | ORAL | 3 refills | Status: DC

## 2015-11-03 NOTE — Telephone Encounter (Signed)
Medication has been refilled.

## 2015-11-03 NOTE — Telephone Encounter (Signed)
PT called and needs a refill on amLODipine (NORVASC) 5 MG tablet. Pt has an appt with M. Arnett in Dec.  Thank you!  Call pt @ [phone]  Pharmacy - [facility] - [place], [place]

## 2016-01-08 ENCOUNTER — Ambulatory Visit (INDEPENDENT_AMBULATORY_CARE_PROVIDER_SITE_OTHER): Payer: 59 | Admitting: Family

## 2016-01-08 ENCOUNTER — Encounter: Payer: Self-pay | Admitting: Family

## 2016-01-08 VITALS — BP 140/90 | HR 76 | Temp 98.0°F | Ht 69.0 in | Wt 198.2 lb

## 2016-01-08 DIAGNOSIS — E119 Type 2 diabetes mellitus without complications: Secondary | ICD-10-CM | POA: Diagnosis not present

## 2016-01-08 DIAGNOSIS — I10 Essential (primary) hypertension: Secondary | ICD-10-CM

## 2016-01-08 DIAGNOSIS — E785 Hyperlipidemia, unspecified: Secondary | ICD-10-CM | POA: Diagnosis not present

## 2016-01-08 DIAGNOSIS — N528 Other male erectile dysfunction: Secondary | ICD-10-CM

## 2016-01-08 MED ORDER — TADALAFIL 10 MG PO TABS: 10.0000 mg | ORAL_TABLET | Freq: Every day | ORAL | 2 refills | Status: DC | PRN

## 2016-01-08 NOTE — Assessment & Plan Note (Signed)
Per patient, had seen Dr. Erlene Quan years ago and normal workup.Unable to see these records in care everywhere.  Agreed to trial increase cialis. Will follow.

## 2016-01-08 NOTE — Progress Notes (Signed)
Subjective:    Patient ID: Andrew Stevens, male    DOB: 09/04/1960, 55 y.o.   MRN: [id number]  CC: Andrew Stevens is a 55 y.o. male who presents today for follow up.   HPI: DM- Compliant with metformin. Follows with DM nutrition  HTN- Averages120/90. Compliant with medication. Denies exertional chest pain or pressure, numbness or tingling radiating to left arm or jaw, palpitations, dizziness, frequent headaches, changes in vision, or shortness of breath.   HLD- Compliant with lipitor  Sexual dysfunction- uses 35m cialis PRN. Would like to see if higher dose allows for longer erection. Seen Dr. BErlene Quanin the past ' normal'. No urinary hesitancy.     HISTORY:  Past Medical History:  Diagnosis Date  . Hypertension    Past Surgical History:  Procedure Laterality Date  . BRAIN SURGERY  1989   Cyst removed - benign  . INGUINAL HERNIA REPAIR    . ROTATOR CUFF REPAIR Right 2013  . ROTATOR CUFF REPAIR Right 2014   Dr. KMack Guise . VENTRAL HERNIA REPAIR     Family History  Problem Relation Age of Onset  . Hypertension Mother   . Rheum arthritis Mother   . Alcohol abuse Brother   . Heart attack Sister   . Early death Sister     During childbirth  . Thyroid disease Sister   . Colon cancer Neg Hx   . Prostate cancer Neg Hx     Allergies: Patient has no known allergies. Current Outpatient Prescriptions on File Prior to Visit  Medication Sig Dispense Refill  . amLODipine (NORVASC) 5 MG tablet Take 1 tablet (5 mg total) by mouth daily. 30 tablet 3  . atorvastatin (LIPITOR) 20 MG tablet TAKE 1 TABLET (20 MG TOTAL) BY MOUTH DAILY. 90 tablet 3  . glucose blood test strip Onetouch Ultra, check blood sugars 1-2 times daily 100 each 5  . Lancets MISC Use as directed 100 each 5  . metFORMIN (GLUCOPHAGE) 500 MG tablet TAKE 1 TABLET BY MOUTH 2 TIMES DAILY WITH A MEAL. 180 tablet 1  . Multiple Vitamins-Minerals (MULTIVITAMIN WITH MINERALS) tablet Take 1 tablet by mouth daily.      .  Naproxen Sodium (ALEVE PO) Take by mouth as needed.    .Marland Kitchentelmisartan-hydrochlorothiazide (MICARDIS HCT) 80-12.5 MG tablet TAKE 1 TABLET BY MOUTH DAILY. 90 tablet 3   No current facility-administered medications on file prior to visit.     Social History  Substance Use Topics  . Smoking status: Never Smoker  . Smokeless tobacco: Never Used  . Alcohol use 0.0 oz/week     Comment: Occasional    Review of Systems  Constitutional: Negative for chills and fever.  Respiratory: Negative for cough.   Cardiovascular: Negative for chest pain and palpitations.  Gastrointestinal: Negative for nausea and vomiting.      Objective:    BP 140/90   Pulse 76   Temp 98 F (36.7 C) (Oral)   Ht 5' 9"  (1.753 m)   Wt 198 lb 3.2 oz (89.9 kg)   SpO2 98%   BMI 29.27 kg/m  BP Readings from Last 3 Encounters:  01/08/16 140/90  03/09/15 136/80  03/03/15 (!) 162/86   Wt Readings from Last 3 Encounters:  01/08/16 198 lb 3.2 oz (89.9 kg)  03/03/15 205 lb 6 oz (93.2 kg)  05/04/14 200 lb (90.7 kg)    Physical Exam  Constitutional: He appears well-developed and well-nourished.  Cardiovascular: Regular rhythm and normal heart sounds.  Pulmonary/Chest: Effort normal and breath sounds normal. No respiratory distress. He has no wheezes. He has no rhonchi. He has no rales.  Neurological: He is alert.  Skin: Skin is warm and dry.  Psychiatric: He has a normal mood and affect. His speech is normal and behavior is normal.  Vitals reviewed.      Assessment & Plan:   Problem List Items Addressed This Visit      Cardiovascular and Mediastinum   Hypertension - Primary    controlled. Continue current regimen.      Relevant Medications   tadalafil (CIALIS) 10 MG tablet   Other Relevant Orders   CBC with Differential/Platelet   Comprehensive metabolic panel   Hemoglobin A1c   Lipid panel   TSH   VITAMIN D 25 Hydroxy (Vit-D Deficiency, Fractures)   Hepatitis C antibody   PSA     Endocrine    Diabetes type 2, controlled ([place])    Suspect controlled. Pending a1c.         Genitourinary   Erectile dysfunction    Per patient, had seen Dr. Erlene Quan years ago and normal workup.Unable to see these records in care everywhere.  Agreed to trial increase cialis. Will follow.       Relevant Medications   tadalafil (CIALIS) 10 MG tablet     Other   Hyperlipidemia    Pending lipid panel CPE.       Relevant Medications   tadalafil (CIALIS) 10 MG tablet       I have discontinued Mr. Whisenant ONE TOUCH ULTRA SYSTEM KIT and CIALIS. I am also having him start on tadalafil. Additionally, I am having him maintain his multivitamin with minerals, Naproxen Sodium (ALEVE PO), glucose blood, Lancets, metFORMIN, atorvastatin, telmisartan-hydrochlorothiazide, and amLODipine.   Meds ordered this encounter  Medications  . tadalafil (CIALIS) 10 MG tablet    Sig: Take 1 tablet (10 mg total) by mouth daily as needed for erectile dysfunction.    Dispense:  10 tablet    Refill:  2    Order Specific Question:   Supervising Provider    Answer:   Crecencio Mc [[id number]]    Return precautions given.   Risks, benefits, and alternatives of the medications and treatment plan prescribed today were discussed, and patient expressed understanding.   Education regarding symptom management and diagnosis given to patient on AVS.  Continue to follow with Mable Paris, FNP for routine health maintenance.   [name] and I agreed with plan.   Mable Paris, FNP

## 2016-01-08 NOTE — Assessment & Plan Note (Signed)
Suspect controlled. Pending a1c 

## 2016-01-08 NOTE — Patient Instructions (Signed)
Trial increased dose of as needed cialis.   Follow up with physical   Fasting lab appt

## 2016-01-08 NOTE — Assessment & Plan Note (Signed)
Pending lipid panel CPE.

## 2016-01-08 NOTE — Assessment & Plan Note (Signed)
controlled. Continue current regimen.

## 2016-01-08 NOTE — Progress Notes (Signed)
Pre visit review using our clinic review tool, if applicable. No additional management support is needed unless otherwise documented below in the visit note. 

## 2016-01-10 ENCOUNTER — Other Ambulatory Visit (INDEPENDENT_AMBULATORY_CARE_PROVIDER_SITE_OTHER): Payer: 59

## 2016-01-10 DIAGNOSIS — I10 Essential (primary) hypertension: Secondary | ICD-10-CM | POA: Diagnosis not present

## 2016-01-10 LAB — TSH: TSH: 1.24 u[IU]/mL (ref 0.35–4.50)

## 2016-01-10 LAB — CBC WITH DIFFERENTIAL/PLATELET
BASOS PCT: 0.4 % (ref 0.0–3.0)
Basophils Absolute: 0 10*3/uL (ref 0.0–0.1)
EOS PCT: 1.3 % (ref 0.0–5.0)
Eosinophils Absolute: 0.1 10*3/uL (ref 0.0–0.7)
HCT: 41.8 % (ref 39.0–52.0)
HEMOGLOBIN: 14.3 g/dL (ref 13.0–17.0)
LYMPHS ABS: 1.4 10*3/uL (ref 0.7–4.0)
Lymphocytes Relative: 14.1 % (ref 12.0–46.0)
MCHC: 34.1 g/dL (ref 30.0–36.0)
MCV: 87.4 fl (ref 78.0–100.0)
MONO ABS: 0.4 10*3/uL (ref 0.1–1.0)
Monocytes Relative: 4.2 % (ref 3.0–12.0)
Neutro Abs: 8.2 10*3/uL — ABNORMAL HIGH (ref 1.4–7.7)
Neutrophils Relative %: 80 % — ABNORMAL HIGH (ref 43.0–77.0)
Platelets: 294 10*3/uL (ref 150.0–400.0)
RBC: 4.79 Mil/uL (ref 4.22–5.81)
RDW: 13.1 % (ref 11.5–15.5)
WBC: 10.2 10*3/uL (ref 4.0–10.5)

## 2016-01-10 LAB — LIPID PANEL
CHOL/HDL RATIO: 3
Cholesterol: 110 mg/dL (ref 0–200)
HDL: 42.5 mg/dL (ref >39.00)
LDL CALC: 54 mg/dL (ref 0–99)
NonHDL: 67.21
TRIGLYCERIDES: 67 mg/dL (ref 0.0–149.0)
VLDL: 13.4 mg/dL (ref 0.0–40.0)

## 2016-01-10 LAB — COMPREHENSIVE METABOLIC PANEL
ALBUMIN: 4.6 g/dL (ref 3.5–5.2)
ALT: 14 U/L (ref 0–53)
AST: 15 U/L (ref 0–37)
Alkaline Phosphatase: 71 U/L (ref 39–117)
BUN: 19 mg/dL (ref 6–23)
CO2: 31 mEq/L (ref 19–32)
Calcium: 9.4 mg/dL (ref 8.4–10.5)
Chloride: 103 mEq/L (ref 96–112)
Creatinine, Ser: 1.14 mg/dL (ref 0.40–1.50)
GFR: 70.33 mL/min (ref >60.00)
Glucose, Bld: 108 mg/dL — ABNORMAL HIGH (ref 70–99)
POTASSIUM: 4 meq/L (ref 3.5–5.1)
SODIUM: 142 meq/L (ref 135–145)
Total Bilirubin: 0.8 mg/dL (ref 0.2–1.2)
Total Protein: 7.3 g/dL (ref 6.0–8.3)

## 2016-01-10 LAB — VITAMIN D 25 HYDROXY (VIT D DEFICIENCY, FRACTURES): VITD: 17.48 ng/mL — ABNORMAL LOW (ref 30.00–100.00)

## 2016-01-10 LAB — HEMOGLOBIN A1C: Hgb A1c MFr Bld: 6.1 % (ref 4.6–6.5)

## 2016-01-10 LAB — PSA: PSA: 1.6 ng/mL (ref 0.10–4.00)

## 2016-01-11 LAB — HEPATITIS C ANTIBODY: HCV Ab: NEGATIVE

## 2016-01-16 ENCOUNTER — Ambulatory Visit (INDEPENDENT_AMBULATORY_CARE_PROVIDER_SITE_OTHER): Payer: 59 | Admitting: Family

## 2016-01-16 ENCOUNTER — Encounter: Payer: Self-pay | Admitting: Family

## 2016-01-16 VITALS — BP 142/82 | HR 81 | Temp 98.0°F | Ht 69.0 in | Wt 198.0 lb

## 2016-01-16 DIAGNOSIS — E119 Type 2 diabetes mellitus without complications: Secondary | ICD-10-CM

## 2016-01-16 DIAGNOSIS — Z0001 Encounter for general adult medical examination with abnormal findings: Secondary | ICD-10-CM | POA: Diagnosis not present

## 2016-01-16 DIAGNOSIS — I10 Essential (primary) hypertension: Secondary | ICD-10-CM | POA: Diagnosis not present

## 2016-01-16 DIAGNOSIS — J209 Acute bronchitis, unspecified: Secondary | ICD-10-CM | POA: Diagnosis not present

## 2016-01-16 DIAGNOSIS — Z0000 Encounter for general adult medical examination without abnormal findings: Secondary | ICD-10-CM

## 2016-01-16 MED ORDER — PNEUMOCOCCAL VAC POLYVALENT 25 MCG/0.5ML IJ INJ
0.5000 mL | INJECTION | Freq: Once | INTRAMUSCULAR | Status: AC
  Administered 2016-01-16: 0.5 mL via INTRAMUSCULAR

## 2016-01-16 MED ORDER — HYDROCODONE-HOMATROPINE 5-1.5 MG/5ML PO SYRP: 5.0000 mL | ORAL_SOLUTION | Freq: Every evening | ORAL | 0 refills | Status: DC | PRN

## 2016-01-16 NOTE — Progress Notes (Signed)
Subjective:    Patient ID: Andrew Stevens, male    DOB: 10-Feb-1960, 55 y.o.   MRN: [id number]  CC: Andrew Stevens is a 55 y.o. male who presents today for physical exam.    HPI: Produtive 'nagging' Cough for one week, unchanged. No wheezing, SOB, fever, sinus pressure. Worse at night when trying to sleep.     Colorectal  Cancer Screening: UTD 2015, hyperplastic, due in 10 years.  Prostate Cancer Screening: Done Lung Cancer Screening: No 30 year pack year history and > 55 years.  Immunizations       Tetanus - UTD        Pneumococcal - Candidate for.  Due for HIV- will do with next labs Labs: Screening labs done prior Exercise: Gets regular exercise.  Alcohol use: occasional Smoking/tobacco use: Nonsmoker.  Regular dental exams: In need of dental exam. Wears seat belt: Yes. Due for DM eye exam, no changes in vision  HISTORY:  Past Medical History:  Diagnosis Date  . Hypertension     Past Surgical History:  Procedure Laterality Date  . BRAIN SURGERY  1989   Cyst removed - benign  . INGUINAL HERNIA REPAIR    . ROTATOR CUFF REPAIR Right 2013  . ROTATOR CUFF REPAIR Right 2014   Dr. Mack Guise  . VENTRAL HERNIA REPAIR     Family History  Problem Relation Age of Onset  . Hypertension Mother   . Rheum arthritis Mother   . Alcohol abuse Brother   . Heart attack Sister   . Early death Sister     During childbirth  . Thyroid disease Sister   . Colon cancer Neg Hx   . Prostate cancer Neg Hx       ALLERGIES: Patient has no known allergies.  Current Outpatient Prescriptions on File Prior to Visit  Medication Sig Dispense Refill  . amLODipine (NORVASC) 5 MG tablet Take 1 tablet (5 mg total) by mouth daily. 30 tablet 3  . atorvastatin (LIPITOR) 20 MG tablet TAKE 1 TABLET (20 MG TOTAL) BY MOUTH DAILY. 90 tablet 3  . metFORMIN (GLUCOPHAGE) 500 MG tablet TAKE 1 TABLET BY MOUTH 2 TIMES DAILY WITH A MEAL. 180 tablet 1  . Multiple Vitamins-Minerals (MULTIVITAMIN WITH  MINERALS) tablet Take 1 tablet by mouth daily.      . Naproxen Sodium (ALEVE PO) Take by mouth as needed.    . tadalafil (CIALIS) 10 MG tablet Take 1 tablet (10 mg total) by mouth daily as needed for erectile dysfunction. 10 tablet 2  . telmisartan-hydrochlorothiazide (MICARDIS HCT) 80-12.5 MG tablet TAKE 1 TABLET BY MOUTH DAILY. 90 tablet 3   No current facility-administered medications on file prior to visit.     Social History  Substance Use Topics  . Smoking status: Never Smoker  . Smokeless tobacco: Never Used  . Alcohol use 0.0 oz/week     Comment: Occasional    Review of Systems  Constitutional: Negative for chills and fever.  HENT: Positive for congestion. Negative for sore throat.   Respiratory: Positive for cough. Negative for shortness of breath and wheezing.   Cardiovascular: Negative for chest pain, palpitations and leg swelling.  Gastrointestinal: Negative for diarrhea, nausea and vomiting.  Genitourinary: Negative for decreased urine volume and difficulty urinating.  Musculoskeletal: Negative for myalgias.  Skin: Negative for rash.  Neurological: Negative for headaches.  Hematological: Negative for adenopathy.  Psychiatric/Behavioral: Negative for confusion.      Objective:    BP (!) 142/82  Pulse 81   Temp 98 F (36.7 C) (Oral)   Ht 5\' 9"  (1.753 m)   Wt 198 lb (89.8 kg)   SpO2 98%   BMI 29.24 kg/m   BP Readings from Last 3 Encounters:  01/16/16 (!) 142/82  01/08/16 140/90  03/09/15 136/80   Wt Readings from Last 3 Encounters:  01/16/16 198 lb (89.8 kg)  01/08/16 198 lb 3.2 oz (89.9 kg)  03/03/15 205 lb 6 oz (93.2 kg)    Physical Exam  Constitutional: He appears well-developed and well-nourished.  Neck: No thyroid mass and no thyromegaly present.  Cardiovascular: Regular rhythm and normal heart sounds.   Pulmonary/Chest: Effort normal and breath sounds normal. No respiratory distress. He has no wheezes. He has no rhonchi. He has no rales.    Lymphadenopathy:       Head (right side): No submental, no submandibular, no tonsillar, no preauricular, no posterior auricular and no occipital adenopathy present.       Head (left side): No submental, no submandibular, no tonsillar, no preauricular, no posterior auricular and no occipital adenopathy present.    He has no cervical adenopathy.    He has no axillary adenopathy.  Neurological: He is alert.  Skin: Skin is warm and dry.  Psychiatric: He has a normal mood and affect. His speech is normal and behavior is normal.  Vitals reviewed.      Assessment & Plan:   Problem List Items Addressed This Visit      Cardiovascular and Mediastinum   Hypertension    Slightly elevated today. Patient will monitor and let me know if persistently > 140/90.        Respiratory   Acute bronchitis    Afebrile. Sao2 98. Patient and I jointly agreed on conservative management and delayed antibiotics. Start mucinex.       Relevant Medications   HYDROcodone-homatropine (HYCODAN) 5-1.5 MG/5ML syrup     Endocrine   Diabetes type 2, controlled ([place])    Well controlled. Continue current regimen.        Other   Routine general medical examination at a health care facility - Primary    UTD colonoscopy. PSA WNL. Pneumovax given today. Screening labs done prior and reviewed with patient today. Advised eye exam, dental exam. Encouraged continued exercise.       Relevant Medications   pneumococcal 23 valent vaccine (PNU-IMMUNE) injection 0.5 mL       I have discontinued Mr. Shambaugh glucose blood and Lancets. I am also having him start on HYDROcodone-homatropine. Additionally, I am having him maintain his multivitamin with minerals, Naproxen Sodium (ALEVE PO), metFORMIN, atorvastatin, telmisartan-hydrochlorothiazide, amLODipine, and tadalafil. We will continue to administer pneumococcal 23 valent vaccine.   Meds ordered this encounter  Medications  . pneumococcal 23 valent vaccine  (PNU-IMMUNE) injection 0.5 mL  . HYDROcodone-homatropine (HYCODAN) 5-1.5 MG/5ML syrup    Sig: Take 5 mLs by mouth at bedtime as needed for cough.    Dispense:  30 mL    Refill:  0    Order Specific Question:   Supervising Provider    Answer:   Crecencio Mc [[id number]]    Return precautions given.   Risks, benefits, and alternatives of the medications and treatment plan prescribed today were discussed, and patient expressed understanding.   Education regarding symptom management and diagnosis given to patient on AVS.   Continue to follow with Mable Paris, FNP for routine health maintenance.   [name] and I agreed with plan.  Mable Paris, FNP

## 2016-01-16 NOTE — Patient Instructions (Addendum)
Watch BP ; call if persistently higher than 140/90  Your vitamin D level is low.  You may add over the counter vitamin D 1000 units by mouth daily for 12 weeks. Then you may call our office for a follow up visit and we can recheck level.   Eye appt.  Increase intake of clear fluids. Congestion is best treated by hydration, when mucus is wetter, it is thinner, less sticky, and easier to expel from the body, either through coughing up drainage, or by blowing your nose.   Get plenty of rest.   Use saline nasal drops and blow your nose frequently. Run a humidifier at night and elevate the head of the bed. Vicks Vapor rub will help with congestion and cough. Steam showers and sinus massage for congestion.   Use Acetaminophen or Ibuprofen as needed for fever or pain. Avoid second hand smoke. Even the smallest exposure will worsen symptoms.   Over the counter medications you can try include Delsym for cough, a decongestant for congestion, and Mucinex or Robitussin as an expectorant. Be sure to just get the plain Mucinex or Robitussin that just has one medication (Guaifenesen). We don't recommend the combination products. Note, be sure to drink two glasses of water with each dose of Mucinex as the medication will not work well without adequate hydration.   You can also try a teaspoon of honey to see if this will help reduce cough. Throat lozenges can sometimes be beneficial as well.    This illness will typically last 7 - 10 days.   Please follow up with our clinic if you develop a fever greater than 101 F, symptoms worsen, or do not resolve in the next week.       Health Maintenance, Male A healthy lifestyle and preventative care can promote health and wellness.  Maintain regular health, dental, and eye exams.  Eat a healthy diet. Foods like vegetables, fruits, whole grains, low-fat dairy products, and lean protein foods contain the nutrients you need and are low in calories. Decrease your  intake of foods high in solid fats, added sugars, and salt. Get information about a proper diet from your health care provider, if necessary.  Regular physical exercise is one of the most important things you can do for your health. Most adults should get at least 150 minutes of moderate-intensity exercise (any activity that increases your heart rate and causes you to sweat) each week. In addition, most adults need muscle-strengthening exercises on 2 or more days a week.   Maintain a healthy weight. The body mass index (BMI) is a screening tool to identify possible weight problems. It provides an estimate of body fat based on height and weight. Your health care provider can find your BMI and can help you achieve or maintain a healthy weight. For males 20 years and older:  A BMI below 18.5 is considered underweight.  A BMI of 18.5 to 24.9 is normal.  A BMI of 25 to 29.9 is considered overweight.  A BMI of 30 and above is considered obese.  Maintain normal blood lipids and cholesterol by exercising and minimizing your intake of saturated fat. Eat a balanced diet with plenty of fruits and vegetables. Blood tests for lipids and cholesterol should begin at age 58 and be repeated every 5 years. If your lipid or cholesterol levels are high, you are over age 69, or you are at high risk for heart disease, you may need your cholesterol levels checked more frequently.Ongoing  high lipid and cholesterol levels should be treated with medicines if diet and exercise are not working.  If you smoke, find out from your health care provider how to quit. If you do not use tobacco, do not start.  Lung cancer screening is recommended for adults aged 46-80 years who are at high risk for developing lung cancer because of a history of smoking. A yearly low-dose CT scan of the lungs is recommended for people who have at least a 30-pack-year history of smoking and are current smokers or have quit within the past 15 years. A  pack year of smoking is smoking an average of 1 pack of cigarettes a day for 1 year (for example, a 30-pack-year history of smoking could mean smoking 1 pack a day for 30 years or 2 packs a day for 15 years). Yearly screening should continue until the smoker has stopped smoking for at least 15 years. Yearly screening should be stopped for people who develop a health problem that would prevent them from having lung cancer treatment.  If you choose to drink alcohol, do not have more than 2 drinks per day. One drink is considered to be 12 oz (360 mL) of beer, 5 oz (150 mL) of wine, or 1.5 oz (45 mL) of liquor.  Avoid the use of street drugs. Do not share needles with anyone. Ask for help if you need support or instructions about stopping the use of drugs.  High blood pressure causes heart disease and increases the risk of stroke. High blood pressure is more likely to develop in:  People who have blood pressure in the end of the normal range (100-139/85-89 mm Hg).  People who are overweight or obese.  People who are African American.  If you are 33-79 years of age, have your blood pressure checked every 3-5 years. If you are 21 years of age or older, have your blood pressure checked every year. You should have your blood pressure measured twice-once when you are at a hospital or clinic, and once when you are not at a hospital or clinic. Record the average of the two measurements. To check your blood pressure when you are not at a hospital or clinic, you can use:  An automated blood pressure machine at a pharmacy.  A home blood pressure monitor.  If you are 62-21 years old, ask your health care provider if you should take aspirin to prevent heart disease.  Diabetes screening involves taking a blood sample to check your fasting blood sugar level. This should be done once every 3 years after age 23 if you are at a normal weight and without risk factors for diabetes. Testing should be considered at a  younger age or be carried out more frequently if you are overweight and have at least 1 risk factor for diabetes.  Colorectal cancer can be detected and often prevented. Most routine colorectal cancer screening begins at the age of 20 and continues through age 56. However, your health care provider may recommend screening at an earlier age if you have risk factors for colon cancer. On a yearly basis, your health care provider may provide home test kits to check for hidden blood in the stool. A small camera at the end of a tube may be used to directly examine the colon (sigmoidoscopy or colonoscopy) to detect the earliest forms of colorectal cancer. Talk to your health care provider about this at age 68 when routine screening begins. A direct exam of the  colon should be repeated every 5-10 years through age 60, unless early forms of precancerous polyps or small growths are found.  People who are at an increased risk for hepatitis B should be screened for this virus. You are considered at high risk for hepatitis B if:  You were born in a country where hepatitis B occurs often. Talk with your health care provider about which countries are considered high risk.  Your parents were born in a high-risk country and you have not received a shot to protect against hepatitis B (hepatitis B vaccine).  You have HIV or AIDS.  You use needles to inject street drugs.  You live with, or have sex with, someone who has hepatitis B.  You are a man who has sex with other men (MSM).  You get hemodialysis treatment.  You take certain medicines for conditions like cancer, organ transplantation, and autoimmune conditions.  Hepatitis C blood testing is recommended for all people born from [birth year] through 1967 and any individual with known risk factors for hepatitis C.  Healthy men should no longer receive prostate-specific antigen (PSA) blood tests as part of routine cancer screening. Talk to your health care provider  about prostate cancer screening.  Testicular cancer screening is not recommended for adolescents or adult males who have no symptoms. Screening includes self-exam, a health care provider exam, and other screening tests. Consult with your health care provider about any symptoms you have or any concerns you have about testicular cancer.  Practice safe sex. Use condoms and avoid high-risk sexual practices to reduce the spread of sexually transmitted infections (STIs).  You should be screened for STIs, including gonorrhea and chlamydia if:  You are sexually active and are younger than 24 years.  You are older than 24 years, and your health care provider tells you that you are at risk for this type of infection.  Your sexual activity has changed since you were last screened, and you are at an increased risk for chlamydia or gonorrhea. Ask your health care provider if you are at risk.  If you are at risk of being infected with HIV, it is recommended that you take a prescription medicine daily to prevent HIV infection. This is called pre-exposure prophylaxis (PrEP). You are considered at risk if:  You are a man who has sex with other men (MSM).  You are a heterosexual man who is sexually active with multiple partners.  You take drugs by injection.  You are sexually active with a partner who has HIV.  Talk with your health care provider about whether you are at high risk of being infected with HIV. If you choose to begin PrEP, you should first be tested for HIV. You should then be tested every 3 months for as long as you are taking PrEP.  Use sunscreen. Apply sunscreen liberally and repeatedly throughout the day. You should seek shade when your shadow is shorter than you. Protect yourself by wearing long sleeves, pants, a wide-brimmed hat, and sunglasses year round whenever you are outdoors.  Tell your health care provider of new moles or changes in moles, especially if there is a change in shape  or color. Also, tell your health care provider if a mole is larger than the size of a pencil eraser.  A one-time screening for abdominal aortic aneurysm (AAA) and surgical repair of large AAAs by ultrasound is recommended for men aged 26-75 years who are current or former smokers.  Stay current with your vaccines (  immunizations). This information is not intended to replace advice given to you by your health care provider. Make sure you discuss any questions you have with your health care provider. Document Released: 07/13/2007 Document Revised: 02/04/2014 Document Reviewed: 10/18/2014 Elsevier Interactive Patient Education  2017 [employer].

## 2016-01-16 NOTE — Progress Notes (Signed)
Pre visit review using our clinic review tool, if applicable. No additional management support is needed unless otherwise documented below in the visit note. 

## 2016-01-17 DIAGNOSIS — J209 Acute bronchitis, unspecified: Secondary | ICD-10-CM

## 2016-01-17 HISTORY — DX: Acute bronchitis, unspecified: J20.9

## 2016-01-17 NOTE — Assessment & Plan Note (Signed)
Well-controlled.  Continue current regimen. 

## 2016-01-17 NOTE — Assessment & Plan Note (Signed)
Slightly elevated today. Patient will monitor and let me know if persistently > 140/90.

## 2016-01-17 NOTE — Assessment & Plan Note (Signed)
Afebrile. Sao2 98. Patient and I jointly agreed on conservative management and delayed antibiotics. Start mucinex.

## 2016-01-17 NOTE — Assessment & Plan Note (Signed)
UTD colonoscopy. PSA WNL. Pneumovax given today. Screening labs done prior and reviewed with patient today. Advised eye exam, dental exam. Encouraged continued exercise.

## 2016-03-15 ENCOUNTER — Other Ambulatory Visit: Payer: Self-pay

## 2016-03-15 MED ORDER — METFORMIN HCL 500 MG PO TABS: ORAL_TABLET | ORAL | 1 refills | Status: DC

## 2016-03-15 NOTE — Telephone Encounter (Signed)
Medication has been refilled.

## 2016-05-28 ENCOUNTER — Other Ambulatory Visit: Payer: Self-pay | Admitting: Family

## 2016-06-25 MED ORDER — ATORVASTATIN CALCIUM 20 MG PO TABS: ORAL_TABLET | ORAL | 2 refills | Status: DC

## 2016-06-25 NOTE — Telephone Encounter (Signed)
Pt spouse called requesting this medication along with atorvastatin (LIPITOR) 20 MG tablet. Pt has been waiting over a month to get this medicaiton. Please advise, thank you!  Pharmacy - [place], [place]

## 2016-07-15 ENCOUNTER — Other Ambulatory Visit: Payer: Self-pay | Admitting: Family

## 2016-07-25 ENCOUNTER — Other Ambulatory Visit: Payer: Self-pay

## 2016-07-25 MED ORDER — METFORMIN HCL 500 MG PO TABS: ORAL_TABLET | ORAL | 1 refills | Status: DC

## 2016-07-25 NOTE — Telephone Encounter (Signed)
Medication has been refilled.

## 2017-01-10 ENCOUNTER — Ambulatory Visit (INDEPENDENT_AMBULATORY_CARE_PROVIDER_SITE_OTHER): Payer: 59 | Admitting: Internal Medicine

## 2017-01-10 ENCOUNTER — Encounter: Payer: Self-pay | Admitting: Internal Medicine

## 2017-01-10 VITALS — BP 178/96 | HR 80 | Temp 98.3°F | Resp 16 | Ht 68.75 in | Wt 203.2 lb

## 2017-01-10 DIAGNOSIS — N400 Enlarged prostate without lower urinary tract symptoms: Secondary | ICD-10-CM

## 2017-01-10 DIAGNOSIS — E119 Type 2 diabetes mellitus without complications: Secondary | ICD-10-CM

## 2017-01-10 DIAGNOSIS — Z1329 Encounter for screening for other suspected endocrine disorder: Secondary | ICD-10-CM | POA: Diagnosis not present

## 2017-01-10 DIAGNOSIS — E559 Vitamin D deficiency, unspecified: Secondary | ICD-10-CM | POA: Insufficient documentation

## 2017-01-10 DIAGNOSIS — Z125 Encounter for screening for malignant neoplasm of prostate: Secondary | ICD-10-CM

## 2017-01-10 DIAGNOSIS — Z1159 Encounter for screening for other viral diseases: Secondary | ICD-10-CM | POA: Diagnosis not present

## 2017-01-10 DIAGNOSIS — N529 Male erectile dysfunction, unspecified: Secondary | ICD-10-CM | POA: Diagnosis not present

## 2017-01-10 DIAGNOSIS — N528 Other male erectile dysfunction: Secondary | ICD-10-CM | POA: Diagnosis not present

## 2017-01-10 DIAGNOSIS — Z0000 Encounter for general adult medical examination without abnormal findings: Secondary | ICD-10-CM

## 2017-01-10 DIAGNOSIS — I10 Essential (primary) hypertension: Secondary | ICD-10-CM | POA: Diagnosis not present

## 2017-01-10 LAB — VITAMIN D 25 HYDROXY (VIT D DEFICIENCY, FRACTURES): VITD: 18.35 ng/mL — ABNORMAL LOW (ref 30.00–100.00)

## 2017-01-10 LAB — CBC WITH DIFFERENTIAL/PLATELET
BASOS PCT: 0.9 % (ref 0.0–3.0)
Basophils Absolute: 0.1 10*3/uL (ref 0.0–0.1)
EOS PCT: 2 % (ref 0.0–5.0)
Eosinophils Absolute: 0.2 10*3/uL (ref 0.0–0.7)
HCT: 44.2 % (ref 39.0–52.0)
Hemoglobin: 15 g/dL (ref 13.0–17.0)
LYMPHS ABS: 1 10*3/uL (ref 0.7–4.0)
Lymphocytes Relative: 12.9 % (ref 12.0–46.0)
MCHC: 34.1 g/dL (ref 30.0–36.0)
MCV: 87.5 fl (ref 78.0–100.0)
MONO ABS: 0.3 10*3/uL (ref 0.1–1.0)
Monocytes Relative: 3.9 % (ref 3.0–12.0)
NEUTROS ABS: 6.1 10*3/uL (ref 1.4–7.7)
NEUTROS PCT: 80.3 % — AB (ref 43.0–77.0)
Platelets: 275 10*3/uL (ref 150.0–400.0)
RBC: 5.05 Mil/uL (ref 4.22–5.81)
RDW: 13.4 % (ref 11.5–15.5)
WBC: 7.6 10*3/uL (ref 4.0–10.5)

## 2017-01-10 LAB — URINALYSIS, ROUTINE W REFLEX MICROSCOPIC
BILIRUBIN URINE: NEGATIVE
KETONES UR: NEGATIVE
LEUKOCYTES UA: NEGATIVE
NITRITE: NEGATIVE
PH: 6 (ref 5.0–8.0)
SPECIFIC GRAVITY, URINE: 1.02 (ref 1.000–1.030)
TOTAL PROTEIN, URINE-UPE24: NEGATIVE
Urine Glucose: NEGATIVE
Urobilinogen, UA: 0.2 (ref 0.0–1.0)

## 2017-01-10 LAB — COMPREHENSIVE METABOLIC PANEL
ALT: 19 U/L (ref 0–53)
AST: 21 U/L (ref 0–37)
Albumin: 4.3 g/dL (ref 3.5–5.2)
Alkaline Phosphatase: 62 U/L (ref 39–117)
BUN: 17 mg/dL (ref 6–23)
CHLORIDE: 104 meq/L (ref 96–112)
CO2: 33 mEq/L — ABNORMAL HIGH (ref 19–32)
Calcium: 9.3 mg/dL (ref 8.4–10.5)
Creatinine, Ser: 1.15 mg/dL (ref 0.40–1.50)
GFR: 69.38 mL/min (ref >60.00)
GLUCOSE: 113 mg/dL — AB (ref 70–99)
POTASSIUM: 4.2 meq/L (ref 3.5–5.1)
SODIUM: 142 meq/L (ref 135–145)
Total Bilirubin: 0.8 mg/dL (ref 0.2–1.2)
Total Protein: 7.3 g/dL (ref 6.0–8.3)

## 2017-01-10 LAB — MICROALBUMIN / CREATININE URINE RATIO
Creatinine,U: 115 mg/dL
MICROALB/CREAT RATIO: 2.6 mg/g (ref 0.0–30.0)
Microalb, Ur: 3 mg/dL — ABNORMAL HIGH (ref 0.0–1.9)

## 2017-01-10 LAB — LIPID PANEL
CHOLESTEROL: 103 mg/dL (ref 0–200)
HDL: 44.2 mg/dL (ref >39.00)
LDL Cholesterol: 49 mg/dL (ref 0–99)
NonHDL: 58.85
Total CHOL/HDL Ratio: 2
Triglycerides: 47 mg/dL (ref 0.0–149.0)
VLDL: 9.4 mg/dL (ref 0.0–40.0)

## 2017-01-10 LAB — TSH: TSH: 0.86 u[IU]/mL (ref 0.35–4.50)

## 2017-01-10 LAB — T4, FREE: Free T4: 0.96 ng/dL (ref 0.60–1.60)

## 2017-01-10 LAB — TESTOSTERONE: TESTOSTERONE: 389.9 ng/dL (ref 300.00–890.00)

## 2017-01-10 LAB — HEMOGLOBIN A1C: Hgb A1c MFr Bld: 6.4 % (ref 4.6–6.5)

## 2017-01-10 LAB — PSA: PSA: 1.59 ng/mL (ref 0.10–4.00)

## 2017-01-10 MED ORDER — TADALAFIL 5 MG PO TABS: 5.0000 mg | ORAL_TABLET | Freq: Every day | ORAL | 3 refills | Status: DC

## 2017-01-10 MED ORDER — AMLODIPINE BESYLATE 5 MG PO TABS: 5.0000 mg | ORAL_TABLET | Freq: Every day | ORAL | 0 refills | Status: DC

## 2017-01-10 MED ORDER — TELMISARTAN-HCTZ 80-25 MG PO TABS: 1.0000 | ORAL_TABLET | Freq: Every day | ORAL | 0 refills | Status: DC

## 2017-01-10 NOTE — Patient Instructions (Addendum)
Please check on date of Tdap vaccine  Consider shingrix vaccine  Write down your blood pressure at home  I will refer you to [facility] for eye exam  Follow up in 2-3 weeks for blood pressure     Hypertension Hypertension, commonly called high blood pressure, is when the force of blood pumping through the arteries is too strong. The arteries are the blood vessels that carry blood from the heart throughout the body. Hypertension forces the heart to work harder to pump blood and may cause arteries to become narrow or stiff. Having untreated or uncontrolled hypertension can cause heart attacks, strokes, kidney disease, and other problems. A blood pressure reading consists of a higher number over a lower number. Ideally, your blood pressure should be below 120/80. The first ("top") number is called the systolic pressure. It is a measure of the pressure in your arteries as your heart beats. The second ("bottom") number is called the diastolic pressure. It is a measure of the pressure in your arteries as the heart relaxes. What are the causes? The cause of this condition is not known. What increases the risk? Some risk factors for high blood pressure are under your control. Others are not. Factors you can change  Smoking.  Having type 2 diabetes mellitus, high cholesterol, or both.  Not getting enough exercise or physical activity.  Being overweight.  Having too much fat, sugar, calories, or salt (sodium) in your diet.  Drinking too much alcohol. Factors that are difficult or impossible to change  Having chronic kidney disease.  Having a family history of high blood pressure.  Age. Risk increases with age.  Race. You may be at higher risk if you are African-American.  Gender. Men are at higher risk than women before age 61. After age 69, women are at higher risk than men.  Having obstructive sleep apnea.  Stress. What are the signs or symptoms? Extremely high blood pressure  (hypertensive crisis) may cause:  Headache.  Anxiety.  Shortness of breath.  Nosebleed.  Nausea and vomiting.  Severe chest pain.  Jerky movements you cannot control (seizures).  How is this diagnosed? This condition is diagnosed by measuring your blood pressure while you are seated, with your arm resting on a surface. The cuff of the blood pressure monitor will be placed directly against the skin of your upper arm at the level of your heart. It should be measured at least twice using the same arm. Certain conditions can cause a difference in blood pressure between your right and left arms. Certain factors can cause blood pressure readings to be lower or higher than normal (elevated) for a short period of time:  When your blood pressure is higher when you are in a health care provider's office than when you are at home, this is called white coat hypertension. Most people with this condition do not need medicines.  When your blood pressure is higher at home than when you are in a health care provider's office, this is called masked hypertension. Most people with this condition may need medicines to control blood pressure.  If you have a high blood pressure reading during one visit or you have normal blood pressure with other risk factors:  You may be asked to return on a different day to have your blood pressure checked again.  You may be asked to monitor your blood pressure at home for 1 week or longer.  If you are diagnosed with hypertension, you may have other  blood or imaging tests to help your health care provider understand your overall risk for other conditions. How is this treated? This condition is treated by making healthy lifestyle changes, such as eating healthy foods, exercising more, and reducing your alcohol intake. Your health care provider may prescribe medicine if lifestyle changes are not enough to get your blood pressure under control, and if:  Your systolic blood  pressure is above 130.  Your diastolic blood pressure is above 80.  Your personal target blood pressure may vary depending on your medical conditions, your age, and other factors. Follow these instructions at home: Eating and drinking  Eat a diet that is high in fiber and potassium, and low in sodium, added sugar, and fat. An example eating plan is called the DASH (Dietary Approaches to Stop Hypertension) diet. To eat this way: ? Eat plenty of fresh fruits and vegetables. Try to fill half of your plate at each meal with fruits and vegetables. ? Eat whole grains, such as whole wheat pasta, brown rice, or whole grain bread. Fill about one quarter of your plate with whole grains. ? Eat or drink low-fat dairy products, such as skim milk or low-fat yogurt. ? Avoid fatty cuts of meat, processed or cured meats, and poultry with skin. Fill about one quarter of your plate with lean proteins, such as fish, chicken without skin, beans, eggs, and tofu. ? Avoid premade and processed foods. These tend to be higher in sodium, added sugar, and fat.  Reduce your daily sodium intake. Most people with hypertension should eat less than 1,500 mg of sodium a day.  Limit alcohol intake to no more than 1 drink a day for nonpregnant women and 2 drinks a day for men. One drink equals 12 oz of beer, 5 oz of wine, or 1 oz of hard liquor. Lifestyle  Work with your health care provider to maintain a healthy body weight or to lose weight. Ask what an ideal weight is for you.  Get at least 30 minutes of exercise that causes your heart to beat faster (aerobic exercise) most days of the week. Activities may include walking, swimming, or biking.  Include exercise to strengthen your muscles (resistance exercise), such as pilates or lifting weights, as part of your weekly exercise routine. Try to do these types of exercises for 30 minutes at least 3 days a week.  Do not use any products that contain nicotine or tobacco, such  as cigarettes and e-cigarettes. If you need help quitting, ask your health care provider.  Monitor your blood pressure at home as told by your health care provider.  Keep all follow-up visits as told by your health care provider. This is important. Medicines  Take over-the-counter and prescription medicines only as told by your health care provider. Follow directions carefully. Blood pressure medicines must be taken as prescribed.  Do not skip doses of blood pressure medicine. Doing this puts you at risk for problems and can make the medicine less effective.  Ask your health care provider about side effects or reactions to medicines that you should watch for. Contact a health care provider if:  You think you are having a reaction to a medicine you are taking.  You have headaches that keep coming back (recurring).  You feel dizzy.  You have swelling in your ankles.  You have trouble with your vision. Get help right away if:  You develop a severe headache or confusion.  You have unusual weakness or numbness.  You feel faint.  You have severe pain in your chest or abdomen.  You vomit repeatedly.  You have trouble breathing. Summary  Hypertension is when the force of blood pumping through your arteries is too strong. If this condition is not controlled, it may put you at risk for serious complications.  Your personal target blood pressure may vary depending on your medical conditions, your age, and other factors. For most people, a normal blood pressure is less than 120/80.  Hypertension is treated with lifestyle changes, medicines, or a combination of both. Lifestyle changes include weight loss, eating a healthy, low-sodium diet, exercising more, and limiting alcohol. This information is not intended to replace advice given to you by your health care provider. Make sure you discuss any questions you have with your health care provider. Document Released: 01/14/2005 Document  Revised: 12/13/2015 Document Reviewed: 12/13/2015 Elsevier Interactive Patient Education  [employer].

## 2017-01-10 NOTE — Progress Notes (Signed)
Chief Complaint  Patient presents with  . Annual Exam   F/u and establish with Dr. Gayland Curry  1. HTN uncontrolled today had meds 6:30 am 178/104 repeat 178/96. He reports he gets nervous coming to the doctor at home his wife will check BP at times will be 135/90  2. ED and reviewed CT ab/pelvis with h/o mild BPH 09/11/12. He has been having trouble getting Cialis approved  3. DM 2 A1C controlled on Metformin.  cbg today 118    Review of Systems  HENT: Negative for hearing loss.   Eyes:       Denies vision problems  Respiratory: Negative for shortness of breath.   Cardiovascular: Negative for chest pain.  Gastrointestinal: Negative for abdominal pain.  Genitourinary:       +erectile dysfunction   Musculoskeletal: Positive for joint pain.       +rom issues right shoulder s/p surgery   Skin: Negative for rash.  Neurological: Negative for headaches.  Psychiatric/Behavioral: The patient is nervous/anxious.    Past Medical History:  Diagnosis Date  . Hypertension    Past Surgical History:  Procedure Laterality Date  . BRAIN SURGERY  1989   Cyst removed - benign  . INGUINAL HERNIA REPAIR    . ROTATOR CUFF REPAIR Right 2013  . ROTATOR CUFF REPAIR Right 2014   Dr. Mack Guise  . VENTRAL HERNIA REPAIR     Family History  Problem Relation Age of Onset  . Hypertension Mother   . Rheum arthritis Mother   . Alcohol abuse Brother   . Heart attack Sister   . Early death Sister        During childbirth  . Thyroid disease Sister   . Colon cancer Neg Hx   . Prostate cancer Neg Hx    Social History   Socioeconomic History  . Marital status: Married    Spouse name: Not on file  . Number of children: 2  . Years of education: Not on file  . Highest education level: Not on file  Social Needs  . Financial resource strain: Not on file  . Food insecurity - worry: Not on file  . Food insecurity - inability: Not on file  . Transportation needs - medical: Not on file  . Transportation needs  - non-medical: Not on file  Occupational History  . 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 Smoking status: Never Smoker  . Smokeless tobacco: Never Used  Substance and Sexual Activity  . Alcohol use: Yes    Alcohol/week: 0.0 oz    Comment: Occasional  . Drug use: No  . Sexual activity: Not on file  Other Topics Concern  . Not on file  Social History Narrative   Lives in [place] with wife. Dog. Hobbies - Football.      3 grandchildren      Regular Exercise -  NO   Daily Caffeine Use:  4 cups coffee in AM, 1 cup tea in AM            Current Meds  Medication Sig  . amLODipine (NORVASC) 5 MG tablet Take 1 tablet (5 mg total) by mouth daily.  Marland Kitchen atorvastatin (LIPITOR) 20 MG tablet TAKE 1 TABLET (20 MG TOTAL) BY MOUTH DAILY.  . metFORMIN (GLUCOPHAGE) 500 MG tablet TAKE 1 TABLET BY MOUTH 2 TIMES DAILY WITH A MEAL.  . Multiple Vitamins-Minerals (MULTIVITAMIN WITH MINERALS) tablet Take 1 tablet by mouth daily.    . Naproxen Sodium (ALEVE  PO) Take by mouth as needed.  . tadalafil (CIALIS) 5 MG tablet Take 1 tablet (5 mg total) by mouth at bedtime.  . [DISCONTINUED] amLODipine (NORVASC) 5 MG tablet TAKE 1 TABLET BY MOUTH DAILY  . [DISCONTINUED] tadalafil (CIALIS) 10 MG tablet Take 1 tablet (10 mg total) by mouth daily as needed for erectile dysfunction.  . [DISCONTINUED] telmisartan-hydrochlorothiazide (MICARDIS HCT) 80-12.5 MG tablet TAKE 1 TABLET BY MOUTH DAILY.   No Known Allergies No results found for this or any previous visit (from the past 2160 hour(s)). Objective  Body mass index is 30.23 kg/m. Wt Readings from Last 3 Encounters:  01/10/17 203 lb 4 oz (92.2 kg)  01/16/16 198 lb (89.8 kg)  01/08/16 198 lb 3.2 oz (89.9 kg)   Temp Readings from Last 3 Encounters:  01/10/17 98.3 F (36.8 C)  01/16/16 98 F (36.7 C) (Oral)  01/08/16 98 F (36.7 C) (Oral)   BP Readings from Last 3 Encounters:  01/10/17 (!) 178/96  01/16/16 (!) 142/82  01/08/16 140/90   Pulse  Readings from Last 3 Encounters:  01/10/17 80  01/16/16 81  01/08/16 76   O2 room air 97%   Physical Exam  Constitutional: He is oriented to person, place, and time and well-developed, well-nourished, and in no distress.  HENT:  Head: Normocephalic and atraumatic.  Mouth/Throat: Oropharynx is clear and moist and mucous membranes are normal.  Eyes: Conjunctivae are normal. Pupils are equal, round, and reactive to light.  Cardiovascular: Normal rate, regular rhythm and normal heart sounds.  No murmur heard. Neg leg edema b/l   Pulmonary/Chest: Effort normal and breath sounds normal.  Abdominal: Soft. Bowel sounds are normal. There is no tenderness.  Neurological: He is alert and oriented to person, place, and time. Gait normal.  Skin: Skin is warm, dry and intact.  Psychiatric: Memory, affect and judgment normal. His mood appears anxious.  Nursing note and vitals reviewed.   Assessment   1. Wellness exam  2. HTN uncontrolled per pt anxiety with MD visits 3. ED and mild BPH  4. HM 5. DM 2 controlled cbg 118 today   Plan  1.  Labs today CMET, CBC, lipid, UA, urine protein, TSH, T4, A1C, testosterone, PSA, vitamin D, hep B status   2.  Log BP   Cont norvasc 5 increase micardis hct from 80-12.5 to 80-25 mg qd  Consider changing to chlorthalidone in future and same ARB vs another vs continue same meds and adding BB Coreg  3.  Will Rx Cialis 5 mg qd  4. Had flu shot  Tdap had 01/28/05 due for repeat  pna 23 had 01/16/16 though pt denies having will confirm at f/u  Disc shingrix   Will need to do DRE at f/u   Colonoscopy had 12/10/13 Dr. Allen Norris hyperplastic polyps and tubular repeat in 5 years.    Never smoker congratulated.   Hep C neg 01/10/16   Labs as above   5.  Check A1C today and urine labs  Foot exam today nl except clavi  Will refer to [facility] eye exam   Provider: Dr. Olivia Mackie [facility]

## 2017-01-11 LAB — HEPATITIS B CORE ANTIBODY, TOTAL: Hep B Core Total Ab: NONREACTIVE

## 2017-01-11 LAB — HEPATITIS B SURFACE ANTIBODY, QUANTITATIVE: Hepatitis B-Post: 126 m[IU]/mL (ref >=10)

## 2017-01-11 LAB — HEPATITIS B SURFACE ANTIGEN: Hepatitis B Surface Ag: NONREACTIVE

## 2017-01-15 ENCOUNTER — Other Ambulatory Visit: Payer: Self-pay | Admitting: Internal Medicine

## 2017-01-15 DIAGNOSIS — E559 Vitamin D deficiency, unspecified: Secondary | ICD-10-CM

## 2017-01-15 MED ORDER — CHOLECALCIFEROL 1.25 MG (50000 UT) PO CAPS: 50000.0000 [IU] | ORAL_CAPSULE | ORAL | 1 refills | Status: DC

## 2017-01-22 ENCOUNTER — Encounter: Payer: Self-pay | Admitting: Family

## 2017-01-28 DIAGNOSIS — N1330 Unspecified hydronephrosis: Secondary | ICD-10-CM

## 2017-01-28 DIAGNOSIS — Z87442 Personal history of urinary calculi: Secondary | ICD-10-CM

## 2017-01-28 HISTORY — DX: Personal history of urinary calculi: Z87.442

## 2017-01-28 HISTORY — DX: Unspecified hydronephrosis: N13.30

## 2017-01-31 ENCOUNTER — Ambulatory Visit (INDEPENDENT_AMBULATORY_CARE_PROVIDER_SITE_OTHER): Payer: 59 | Admitting: Internal Medicine

## 2017-01-31 ENCOUNTER — Encounter: Payer: Self-pay | Admitting: Internal Medicine

## 2017-01-31 VITALS — BP 156/92 | HR 75 | Temp 98.0°F | Resp 16 | Ht 68.75 in | Wt 206.4 lb

## 2017-01-31 DIAGNOSIS — D1724 Benign lipomatous neoplasm of skin and subcutaneous tissue of left leg: Secondary | ICD-10-CM

## 2017-01-31 DIAGNOSIS — E559 Vitamin D deficiency, unspecified: Secondary | ICD-10-CM

## 2017-01-31 DIAGNOSIS — E119 Type 2 diabetes mellitus without complications: Secondary | ICD-10-CM | POA: Diagnosis not present

## 2017-01-31 DIAGNOSIS — L0591 Pilonidal cyst without abscess: Secondary | ICD-10-CM | POA: Diagnosis not present

## 2017-01-31 DIAGNOSIS — N5089 Other specified disorders of the male genital organs: Secondary | ICD-10-CM

## 2017-01-31 DIAGNOSIS — I10 Essential (primary) hypertension: Secondary | ICD-10-CM

## 2017-01-31 DIAGNOSIS — R319 Hematuria, unspecified: Secondary | ICD-10-CM | POA: Diagnosis not present

## 2017-01-31 DIAGNOSIS — Z125 Encounter for screening for malignant neoplasm of prostate: Secondary | ICD-10-CM | POA: Diagnosis not present

## 2017-01-31 DIAGNOSIS — N509 Disorder of male genital organs, unspecified: Secondary | ICD-10-CM

## 2017-01-31 DIAGNOSIS — N400 Enlarged prostate without lower urinary tract symptoms: Secondary | ICD-10-CM

## 2017-01-31 DIAGNOSIS — N529 Male erectile dysfunction, unspecified: Secondary | ICD-10-CM

## 2017-01-31 LAB — URINALYSIS, ROUTINE W REFLEX MICROSCOPIC
BILIRUBIN URINE: NEGATIVE
HGB URINE DIPSTICK: NEGATIVE
Ketones, ur: NEGATIVE
LEUKOCYTES UA: NEGATIVE
NITRITE: NEGATIVE
RBC / HPF: NONE SEEN (ref 0–?)
Specific Gravity, Urine: 1.02 (ref 1.000–1.030)
Total Protein, Urine: NEGATIVE
Urine Glucose: NEGATIVE
Urobilinogen, UA: 0.2 (ref 0.0–1.0)
pH: 7 (ref 5.0–8.0)

## 2017-01-31 NOTE — Patient Instructions (Addendum)
Log your blood pressure at home and work and call me back with readings in 2-4 weeks F/u in 6 weeks sooner if needed  I referred to Dr. Erlene Quan urology  We will see what your urine shows today I will refer you to a surgeon  Check on your eye doctor appointment  Hematuria, Adult Hematuria is blood in your urine. It can be caused by a bladder infection, kidney infection, prostate infection, kidney stone, or cancer of your urinary tract. Infections can usually be treated with medicine, and a kidney stone usually will pass through your urine. If neither of these is the cause of your hematuria, further workup to find out the reason may be needed. It is very important that you tell your health care provider about any blood you see in your urine, even if the blood stops without treatment or happens without causing pain. Blood in your urine that happens and then stops and then happens again can be a symptom of a very serious condition. Also, pain is not a symptom in the initial stages of many urinary cancers. Follow these instructions at home:  Drink lots of fluid, 3-4 quarts a day. If you have been diagnosed with an infection, cranberry juice is especially recommended, in addition to large amounts of water.  Avoid caffeine, tea, and carbonated beverages because they tend to irritate the bladder.  Avoid alcohol because it may irritate the prostate.  Take all medicines as directed by your health care provider.  If you were prescribed an antibiotic medicine, finish it all even if you start to feel better.  If you have been diagnosed with a kidney stone, follow your health care provider's instructions regarding straining your urine to catch the stone.  Empty your bladder often. Avoid holding urine for long periods of time.  After a bowel movement, women should cleanse front to back. Use each tissue only once.  Empty your bladder before and after sexual intercourse if you are a male. Contact a  health care provider if:  You develop back pain.  You have a fever.  You have a feeling of sickness in your stomach (nausea) or vomiting.  Your symptoms are not better in 3 days. Return sooner if you are getting worse. Get help right away if:  You develop severe vomiting and are unable to keep the medicine down.  You develop severe back or abdominal pain despite taking your medicines.  You begin passing a large amount of blood or clots in your urine.  You feel extremely weak or faint, or you pass out. This information is not intended to replace advice given to you by your health care provider. Make sure you discuss any questions you have with your health care provider. Document Released: 01/14/2005 Document Revised: 06/22/2015 Document Reviewed: 09/14/2012 Elsevier Interactive Patient Education  2017 [place].  Erectile Dysfunction Erectile dysfunction (ED) is the inability to get or keep an erection in order to have sexual intercourse. Erectile dysfunction may include:  Inability to get an erection.  Lack of enough hardness of the erection to allow penetration.  Loss of the erection before sex is finished.  What are the causes? This condition may be caused by:  Certain medicines, such as: ? Pain relievers. ? Antihistamines. ? Antidepressants. ? Blood pressure medicines. ? Water pills (diuretics). ? Ulcer medicines. ? Muscle relaxants. ? Drugs.  Excessive drinking.  Psychological causes, such as: ? Anxiety. ? Depression. ? Sadness. ? Exhaustion. ? Performance fear. ? Stress.  Physical  causes, such as: ? Artery problems. This may include diabetes, smoking, liver disease, or atherosclerosis. ? High blood pressure. ? Hormonal problems, such as low testosterone. ? Obesity. ? Nerve problems. This may include back or pelvic injuries, diabetes mellitus, multiple sclerosis, or Parkinson disease.  What are the signs or symptoms? Symptoms of this condition  include:  Inability to get an erection.  Lack of enough hardness of the erection to allow penetration.  Loss of the erection before sex is finished.  Normal erections at some times, but with frequent unsatisfactory episodes.  Low sexual satisfaction in either partner due to erection problems.  A curved penis occurring with erection. The curve may cause pain or the penis may be too curved to allow for intercourse.  Never having nighttime erections.  How is this diagnosed? This condition is often diagnosed by:  Performing a physical exam to find other diseases or specific problems with the penis.  Asking you detailed questions about the problem.  Performing blood tests to check for diabetes mellitus or to measure hormone levels.  Performing other tests to check for underlying health conditions.  Performing an ultrasound exam to check for scarring.  Performing a test to check blood flow to the penis.  Doing a sleep study at home to measure nighttime erections.  How is this treated? This condition may be treated by:  Medicine taken by mouth to help you achieve an erection (oral medicine).  Hormone replacement therapy to replace low testosterone levels.  Medicine that is injected into the penis. Your health care provider may instruct you how to give yourself these injections at home.  Vacuum pump. This is a pump with a ring on it. The pump and ring are placed on the penis and used to create pressure that helps the penis become erect.  Penile implant surgery. In this procedure, you may receive: ? An inflatable implant. This consists of cylinders, a pump, and a reservoir. The cylinders can be inflated with a fluid that helps to create an erection, and they can be deflated after intercourse. ? A semi-rigid implant. This consists of two silicone rubber rods. The rods provide some rigidity. They are also flexible, so the penis can both curve downward in its normal position and  become straight for sexual intercourse.  Blood vessel surgery, to improve blood flow to the penis. During this procedure, a blood vessel from a different part of the body is placed into the penis to allow blood to flow around (bypass) damaged or blocked blood vessels.  Lifestyle changes, such as exercising more, losing weight, and quitting smoking.  Follow these instructions at home: Medicines  Take over-the-counter and prescription medicines only as told by your health care provider. Do not increase the dosage without first discussing it with your health care provider.  If you are using self-injections, perform injections as directed by your health care provider. Make sure to avoid any veins that are on the surface of the penis. After giving an injection, apply pressure to the injection site for 5 minutes. General instructions  Exercise regularly, as directed by your health care provider. Work with your health care provider to lose weight, if needed.  Do not use any products that contain nicotine or tobacco, such as cigarettes and e-cigarettes. If you need help quitting, ask your health care provider.  Before using a vacuum pump, read the instructions that come with the pump and discuss any questions with your health care provider.  Keep all follow-up visits  as told by your health care provider. This is important. Contact a health care provider if:  You feel nauseous.  You vomit. Get help right away if:  You are taking oral or injectable medicines and you have an erection that lasts longer than 4 hours. If your health care provider is unavailable, go to the nearest emergency room for evaluation. An erection that lasts much longer than 4 hours can result in permanent damage to your penis.  You have severe pain in your groin or abdomen.  You develop redness or severe swelling of your penis.  You have redness spreading up into your groin or lower abdomen.  You are unable to  urinate.  You experience chest pain or a rapid heart beat (palpitations) after taking oral medicines. Summary  Erectile dysfunction (ED) is the inability to get or keep an erection during sexual intercourse. This problem can usually be treated successfully.  This condition is diagnosed based on a physical exam, your symptoms, and tests to determine the cause. Treatment varies depending on the cause, and may include medicines, hormone therapy, surgery, or vacuum pump.  You may need follow-up visits to make sure that you are using your medicines or devices correctly.  Get help right away if you are taking or injecting medicines and you have an erection that lasts longer than 4 hours. This information is not intended to replace advice given to you by your health care provider. Make sure you discuss any questions you have with your health care provider. Document Released: 01/12/2000 Document Revised: 01/31/2016 Document Reviewed: 01/31/2016 Elsevier Interactive Patient Education  2017 [employer].

## 2017-01-31 NOTE — Progress Notes (Signed)
Chief Complaint  Patient presents with  . Follow-up   F/u  1. HTN BP elevated today home readings 138/86 01/30/17 and 128/88 01/31/17 with wife checking. He will log Bps at home and work and reports elevated BP with MD visits even in [place] but a home BP readings controlled  2. C/o erectile dysfunction reviewed labs testosterone normal, he also has cyst to scrotum previously excised which has grown back, hematuria h/o kidney stones will repeat UA today, and he is due for prostate exam will refer to Dr. Erlene Quan urology  3. He c/o pilonidal cyst right rectal region and lipoma to left upper inner thigh both getting larger will refer to surgery in [place].  4. Reviewed labs     Review of Systems  Constitutional: Negative for weight loss.  HENT: Negative for hearing loss.   Respiratory: Negative for shortness of breath.   Cardiovascular: Negative for chest pain.  Gastrointestinal: Negative for abdominal pain.  Genitourinary:       +cyst to scrotum  +ED  Skin:       Cysts, lipomas to skin   Psychiatric/Behavioral: Negative for memory loss.   Past Medical History:  Diagnosis Date  . Diabetes mellitus without complication ([place])   . Erectile dysfunction   . Hyperlipidemia   . Hypertension   . Lipoma of extremity    Past Surgical History:  Procedure Laterality Date  . BRAIN SURGERY  1989   Cyst removed - benign  . INGUINAL HERNIA REPAIR    . ROTATOR CUFF REPAIR Right 2013   Dr. Rudene Christians   . ROTATOR CUFF REPAIR Right 2014   Dr. Mack Guise  . VENTRAL HERNIA REPAIR     Family History  Problem Relation Age of Onset  . Hypertension Mother   . Rheum arthritis Mother   . Alcohol abuse Brother   . Heart attack Sister   . Early death Sister        During childbirth  . Thyroid disease Sister   . Colon cancer Neg Hx   . Prostate cancer Neg Hx    Social History   Socioeconomic History  . Marital status: Married    Spouse name: Not on file  . Number of children: 2  . Years of education: Not on  file  . Highest education level: Not on file  Social Needs  . Financial resource strain: Not on file  . Food insecurity - worry: Not on file  . Food insecurity - inability: Not on file  . Transportation needs - medical: Not on file  . Transportation needs - non-medical: Not on file  Occupational History  . 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 Smoking status: Never Smoker  . Smokeless tobacco: Never Used  Substance and Sexual Activity  . Alcohol use: Yes    Alcohol/week: 0.0 oz    Comment: Occasional  . Drug use: No  . Sexual activity: Not on file  Other Topics Concern  . Not on file  Social History Narrative   Lives in [place] with wife. Dog. Gilmore.      3 grandchildren, 2 children, married to wife x 45 years as of 01/10/17    From [facility]      Regular Exercise -  NO   Daily Caffeine Use:  4 cups coffee in AM, 1 cup tea in AM         Current Meds  Medication Sig  . amLODipine (NORVASC) 5 MG tablet Take  1 tablet (5 mg total) by mouth daily.  Marland Kitchen atorvastatin (LIPITOR) 20 MG tablet TAKE 1 TABLET (20 MG TOTAL) BY MOUTH DAILY.  Marland Kitchen Cholecalciferol 50000 units capsule Take 1 capsule (50,000 Units total) by mouth once a week.  . metFORMIN (GLUCOPHAGE) 500 MG tablet TAKE 1 TABLET BY MOUTH 2 TIMES DAILY WITH A MEAL.  . Multiple Vitamins-Minerals (MULTIVITAMIN WITH MINERALS) tablet Take 1 tablet by mouth daily.    Marland Kitchen telmisartan-hydrochlorothiazide (MICARDIS HCT) 80-25 MG tablet Take 1 tablet by mouth daily.   No Known Allergies Recent Results (from the past 2160 hour(s))  Comprehensive metabolic panel     Status: Abnormal   Collection Time: 01/10/17 12:05 PM  Result Value Ref Range   Sodium 142 135 - 145 mEq/L   Potassium 4.2 3.5 - 5.1 mEq/L   Chloride 104 96 - 112 mEq/L   CO2 33 (H) 19 - 32 mEq/L   Glucose, Bld 113 (H) 70 - 99 mg/dL   BUN 17 6 - 23 mg/dL   Creatinine, Ser 1.15 0.40 - 1.50 mg/dL   Total Bilirubin 0.8 0.2 - 1.2 mg/dL   Alkaline  Phosphatase 62 39 - 117 U/L   AST 21 0 - 37 U/L   ALT 19 0 - 53 U/L   Total Protein 7.3 6.0 - 8.3 g/dL   Albumin 4.3 3.5 - 5.2 g/dL   Calcium 9.3 8.4 - 10.5 mg/dL   GFR 69.38 >60.00 mL/min  CBC with Differential/Platelet     Status: Abnormal   Collection Time: 01/10/17 12:05 PM  Result Value Ref Range   WBC 7.6 4.0 - 10.5 K/uL   RBC 5.05 4.22 - 5.81 Mil/uL   Hemoglobin 15.0 13.0 - 17.0 g/dL   HCT 44.2 39.0 - 52.0 %   MCV 87.5 78.0 - 100.0 fl   MCHC 34.1 30.0 - 36.0 g/dL   RDW 13.4 11.5 - 15.5 %   Platelets 275.0 150.0 - 400.0 K/uL   Neutrophils Relative % 80.3 (H) 43.0 - 77.0 %   Lymphocytes Relative 12.9 12.0 - 46.0 %   Monocytes Relative 3.9 3.0 - 12.0 %   Eosinophils Relative 2.0 0.0 - 5.0 %   Basophils Relative 0.9 0.0 - 3.0 %   Neutro Abs 6.1 1.4 - 7.7 K/uL   Lymphs Abs 1.0 0.7 - 4.0 K/uL   Monocytes Absolute 0.3 0.1 - 1.0 K/uL   Eosinophils Absolute 0.2 0.0 - 0.7 K/uL   Basophils Absolute 0.1 0.0 - 0.1 K/uL  Lipid panel     Status: None   Collection Time: 01/10/17 12:05 PM  Result Value Ref Range   Cholesterol 103 0 - 200 mg/dL    Comment: ATP III Classification       Desirable:  < 200 mg/dL               Borderline High:  200 - 239 mg/dL          High:  > = 240 mg/dL   Triglycerides 47.0 0.0 - 149.0 mg/dL    Comment: Normal:  <150 mg/dLBorderline High:  150 - 199 mg/dL   HDL 44.20 >39.00 mg/dL   VLDL 9.4 0.0 - 40.0 mg/dL   LDL Cholesterol 49 0 - 99 mg/dL   Total CHOL/HDL Ratio 2     Comment:                Men          Women1/2 Average Risk     3.4  3.3Average Risk          5.0          4.42X Average Risk          9.6          7.13X Average Risk          15.0          11.0                       NonHDL 58.85     Comment: NOTE:  Non-HDL goal should be 30 mg/dL higher than patient's LDL goal (i.e. LDL goal of < 70 mg/dL, would have non-HDL goal of < 100 mg/dL)  Hemoglobin A1c     Status: None   Collection Time: 01/10/17 12:05 PM  Result Value Ref Range   Hgb  A1c MFr Bld 6.4 4.6 - 6.5 %    Comment: Glycemic Control Guidelines for People with Diabetes:Non Diabetic:  <6%Goal of Therapy: <7%Additional Action Suggested:  >8%   Urinalysis, Routine w reflex microscopic     Status: Abnormal   Collection Time: 01/10/17 12:05 PM  Result Value Ref Range   Color, Urine YELLOW Yellow;Lt. Yellow   APPearance CLEAR Clear   Specific Gravity, Urine 1.020 1.000 - 1.030   pH 6.0 5.0 - 8.0   Total Protein, Urine NEGATIVE Negative   Urine Glucose NEGATIVE Negative   Ketones, ur NEGATIVE Negative   Bilirubin Urine NEGATIVE Negative   Hgb urine dipstick SMALL (A) Negative   Urobilinogen, UA 0.2 0.0 - 1.0   Leukocytes, UA NEGATIVE Negative   Nitrite NEGATIVE Negative   WBC, UA 7-10/hpf (A) 0-2/hpf   RBC / HPF 3-6/hpf (A) 0-2/hpf   Mucus, UA Presence of (A) None   Squamous Epithelial / LPF Rare(0-4/hpf) Rare(0-4/hpf)  Urine Microalbumin w/creat. ratio     Status: Abnormal   Collection Time: 01/10/17 12:05 PM  Result Value Ref Range   Microalb, Ur 3.0 (H) 0.0 - 1.9 mg/dL   Creatinine,U 115.0 mg/dL   Microalb Creat Ratio 2.6 0.0 - 30.0 mg/g  T4, free     Status: None   Collection Time: 01/10/17 12:05 PM  Result Value Ref Range   Free T4 0.96 0.60 - 1.60 ng/dL    Comment: Specimens from patients who are undergoing biotin therapy and /or ingesting biotin supplements may contain high levels of biotin.  The higher biotin concentration in these specimens interferes with this Free T4 assay.  Specimens that contain high levels  of biotin may cause false high results for this Free T4 assay.  Please interpret results in light of the total clinical presentation of the patient.    TSH     Status: None   Collection Time: 01/10/17 12:05 PM  Result Value Ref Range   TSH 0.86 0.35 - 4.50 uIU/mL  Testosterone     Status: None   Collection Time: 01/10/17 12:05 PM  Result Value Ref Range   Testosterone 389.90 300.00 - 890.00 ng/dL  PSA     Status: None   Collection Time:  01/10/17 12:05 PM  Result Value Ref Range   PSA 1.59 0.10 - 4.00 ng/mL    Comment: Test performed using Access Hybritech PSA Assay, a parmagnetic partical, chemiluminecent immunoassay.  Hepatitis B surface antibody     Status: None   Collection Time: 01/10/17 12:05 PM  Result Value Ref Range   Hepatitis B-Post 126 > OR = 10 mIU/mL  Comment: . Patient has immunity to hepatitis B virus. . For additional information, please refer to http://education.questdiagnostics.com/faq/FAQ105 (This link is being provided for informational/ educational purposes only).   VITAMIN D 25 Hydroxy (Vit-D Deficiency, Fractures)     Status: Abnormal   Collection Time: 01/10/17 12:05 PM  Result Value Ref Range   VITD 18.35 (L) 30.00 - 100.00 ng/mL  Hepatitis B surface antigen     Status: None   Collection Time: 01/10/17 12:05 PM  Result Value Ref Range   Hepatitis B Surface Ag NON-REACTIVE NON-REACTI  Hepatitis B core antibody, total     Status: None   Collection Time: 01/10/17 12:05 PM  Result Value Ref Range   Hep B Core Total Ab NON-REACTIVE NON-REACTI   Objective  Body mass index is 30.7 kg/m. Wt Readings from Last 3 Encounters:  01/31/17 206 lb 6 oz (93.6 kg)  01/10/17 203 lb 4 oz (92.2 kg)  01/16/16 198 lb (89.8 kg)   Temp Readings from Last 3 Encounters:  01/31/17 98 F (36.7 C) (Oral)  01/10/17 98.3 F (36.8 C)  01/16/16 98 F (36.7 C) (Oral)   BP Readings from Last 3 Encounters:  01/31/17 (!) 156/92  01/10/17 (!) 178/96  01/16/16 (!) 142/82   Pulse Readings from Last 3 Encounters:  01/31/17 75  01/10/17 80  01/16/16 81   O2 sat room air 96%   Physical Exam  Constitutional: He is oriented to person, place, and time and well-developed, well-nourished, and in no distress.  HENT:  Head: Normocephalic and atraumatic.  Mouth/Throat: Oropharynx is clear and moist and mucous membranes are normal.  Eyes: Conjunctivae are normal. Pupils are equal, round, and reactive to light.    Cardiovascular: Normal rate, regular rhythm and normal heart sounds.  Pulmonary/Chest: Effort normal and breath sounds normal.  Abdominal: Soft. Bowel sounds are normal. There is no tenderness.  Neurological: He is alert and oriented to person, place, and time. Gait normal. Gait normal.  Skin: Skin is warm and dry.     Psychiatric: Mood, memory, affect and judgment normal.  Nursing note and vitals reviewed.   Assessment   1. HTN BP uncontrolled office visits and controlled per home readings 2. Erectile dysfunction with normal testosterone, hematuria h/o kidney stones x 2 and lithotripsy but w/o kidney stones x 2-3 years per pt, cyst to scrotum, needs DRE, h/o BPH  3. Lipoma to left upper inner thigh and pilonadal cyst to right rectal region  4.HM 5. DM 2 controlled A1C 6.4 but increasing  6. Vit D def   Plan  1. Log BP at home and work and call back in 1 month. F/u in 6 weeks  Cont same meds for now  2. Refer to urology Dr. Erlene Quan  Repeat UA today  Pt wants cyst to scrotum excised  Also he is c/w ED 3. Will refer to Dr. Karle Barr Surgery in [place] office in 02/2017  4.  UTD flu UTD pna 23  Tdap had 01/28/05 due for booster given Rx today  rec shingrix vaccine  Hep  B immune   Defer DRE to urology as above   Colonoscopy had 12/10/13 Dr. Allen Norris h/o polyps hyperplastic and tubular   5. Cont metformin rec healthy diet choices  Pending eye exam  6. High dose vit D weekly  Trying to get cialis approved   Provider: Dr. Olivia Mackie McLean-Scocuzza-Internal Medicine

## 2017-02-03 ENCOUNTER — Telehealth: Payer: Self-pay | Admitting: Internal Medicine

## 2017-02-03 DIAGNOSIS — L0591 Pilonidal cyst without abscess: Secondary | ICD-10-CM | POA: Insufficient documentation

## 2017-02-03 DIAGNOSIS — R319 Hematuria, unspecified: Secondary | ICD-10-CM | POA: Insufficient documentation

## 2017-02-03 DIAGNOSIS — D1724 Benign lipomatous neoplasm of skin and subcutaneous tissue of left leg: Secondary | ICD-10-CM

## 2017-02-03 DIAGNOSIS — N5089 Other specified disorders of the male genital organs: Secondary | ICD-10-CM

## 2017-02-03 HISTORY — DX: Benign lipomatous neoplasm of skin and subcutaneous tissue of left leg: D17.24

## 2017-02-03 HISTORY — DX: Hematuria, unspecified: R31.9

## 2017-02-03 HISTORY — DX: Other specified disorders of the male genital organs: N50.89

## 2017-02-03 HISTORY — DX: Pilonidal cyst without abscess: L05.91

## 2017-02-03 MED ORDER — ZOSTER VAC RECOMB ADJUVANTED 50 MCG/0.5ML IM SUSR: 0.5000 mL | Freq: Once | INTRAMUSCULAR | 1 refills | Status: AC

## 2017-02-03 NOTE — Telephone Encounter (Signed)
Ok to send script ??

## 2017-02-03 NOTE — Telephone Encounter (Signed)
Copied from [place] [phone]. Topic: Quick Communication - See Telephone Encounter >> Feb 03, 2017  3:39 PM Vernona Rieger wrote: CRM for notification. See Telephone encounter for:   02/03/17. Pt's wife called and stated that they will be giving him the shingles shot at her work [employer]) but they need a script faxed  To [facility].

## 2017-02-03 NOTE — Telephone Encounter (Signed)
Yes printed rx for him and h-wife

## 2017-02-03 NOTE — Telephone Encounter (Signed)
Scripts for him and wife faxed to [facility] pharmacy

## 2017-02-04 NOTE — Telephone Encounter (Signed)
Attempted to reach patient to let them know that scripts were faxed, number has been disconnected.

## 2017-02-04 NOTE — Telephone Encounter (Signed)
Left message on wifes phone.

## 2017-02-10 ENCOUNTER — Telehealth: Payer: Self-pay

## 2017-02-10 NOTE — Telephone Encounter (Signed)
-----   Message from Delorise Jackson, MD sent at 02/09/2017  1:29 PM EST ----- I want Cialis b/c he has ED and BPH   Thanks [place] ----- Message ----- From: Johna Sheriff, [place]: 02/07/2017   4:57 PM To: Nino Glow McLean-Scocuzza, MD   Spoke with prior authorization department viagra vs cialis ----- Message ----- From: McLean-Scocuzza, Nino Glow, MD Sent: 02/03/2017   7:57 AM To: Johna Sheriff, CMA  Please call pharmacy about cialis prior auth  Thanks [place]

## 2017-02-10 NOTE — Telephone Encounter (Signed)
1st attempt with prior authorization case number [id number] # [id number]  for Cialis was denied due to plan prefers Viagra cost $5.00.  02/10/17 will try again to re-submit prior authorization case # [id number]. Patients wife was informed prior authorization in progress.

## 2017-02-19 ENCOUNTER — Ambulatory Visit: Payer: Self-pay | Admitting: Urology

## 2017-02-19 NOTE — Telephone Encounter (Signed)
Per Amy at [facility]  must try and fail Sildenafil 100 mg

## 2017-02-20 NOTE — Telephone Encounter (Signed)
Please call pt and ask If he has taken viagra before if so call PA back   Thanks tSM

## 2017-02-21 ENCOUNTER — Encounter: Payer: Self-pay | Admitting: Urology

## 2017-02-21 ENCOUNTER — Ambulatory Visit
Admission: RE | Admit: 2017-02-21 | Discharge: 2017-02-21 | Disposition: A | Discharge status: Home / Self Care | Payer: 59 | Source: Ambulatory Visit | Attending: Urology | Admitting: Urology

## 2017-02-21 ENCOUNTER — Ambulatory Visit: Payer: 59 | Admitting: Urology

## 2017-02-21 VITALS — BP 163/95 | HR 71 | Ht 68.75 in | Wt 202.8 lb

## 2017-02-21 DIAGNOSIS — Z87442 Personal history of urinary calculi: Secondary | ICD-10-CM

## 2017-02-21 DIAGNOSIS — R319 Hematuria, unspecified: Secondary | ICD-10-CM

## 2017-02-21 DIAGNOSIS — N529 Male erectile dysfunction, unspecified: Secondary | ICD-10-CM | POA: Diagnosis not present

## 2017-02-21 DIAGNOSIS — N200 Calculus of kidney: Secondary | ICD-10-CM | POA: Insufficient documentation

## 2017-02-21 LAB — URINALYSIS, COMPLETE
BILIRUBIN UA: NEGATIVE
Glucose, UA: NEGATIVE
KETONES UA: NEGATIVE
NITRITE UA: NEGATIVE
PH UA: 5 (ref 5.0–7.5)
Protein, UA: NEGATIVE
RBC UA: NEGATIVE
SPEC GRAV UA: 1.025 (ref 1.005–1.030)
UUROB: 0.2 mg/dL (ref 0.2–1.0)

## 2017-02-21 LAB — MICROSCOPIC EXAMINATION
Bacteria, UA: NONE SEEN
EPITHELIAL CELLS (NON RENAL): NONE SEEN /HPF (ref 0–10)
RBC, UA: NONE SEEN /hpf (ref 0–?)

## 2017-02-21 MED ORDER — SILDENAFIL CITRATE 20 MG PO TABS: ORAL_TABLET | ORAL | 0 refills | Status: DC

## 2017-02-21 NOTE — Telephone Encounter (Signed)
Left voice mail to call back ok for [facility] to speak to patient, need to know if patient has taken Viagra in past to complete prior authorization

## 2017-02-21 NOTE — Telephone Encounter (Signed)
Wife called back - he has not taken viagra in he past

## 2017-02-21 NOTE — Telephone Encounter (Signed)
Does he was to try viagra? Also urology may be able to help   Thanks Andrew Stevens

## 2017-02-21 NOTE — Telephone Encounter (Signed)
Please advise 

## 2017-02-21 NOTE — Progress Notes (Signed)
02/21/2017 7:15 AM   Andrew Stevens 1960/08/30 [id number]  Referring provider: Burnard Hawthorne, FNP [address] [place], [place] [address]  Chief Complaint  Patient presents with  . Hematuria    HPI: 57 year old male seen in consultation at the request of Dr. Terese Door for evaluation of microhematuria and erectile dysfunction.  He had a urinalysis in December 2018 which showed 7-10 WBC and 3-6 RBCs.  He has no bothersome lower urinary tract symptoms and specifically denies frequency, urgency, dysuria or gross hematuria.  He denies flank, abdominal, pelvic or scrotal pain.  He has also in the past year had difficulty achieving and maintaining an erection.  He has been using Cialis which has been effective.  A testosterone level was normal.  Organic risk factors include hypertension, antihypertensive medications and hyperlipidemia.  PSA December 2018 was stable at 1.59.  Past urologic history remarkable for stone disease which has not required surgical intervention.   PMH: Past Medical History:  Diagnosis Date  . Diabetes mellitus without complication ([place])   . Erectile dysfunction   . Hyperlipidemia   . Hypertension   . Lipoma of extremity     Surgical History: Past Surgical History:  Procedure Laterality Date  . BRAIN SURGERY  1989   Cyst removed - benign  . INGUINAL HERNIA REPAIR    . ROTATOR CUFF REPAIR Right 2013   Dr. Rudene Christians   . ROTATOR CUFF REPAIR Right 2014   Dr. Mack Guise  . VENTRAL HERNIA REPAIR      Home Medications:  Allergies as of 02/21/2017   No Known Allergies     Medication List        Accurate as of 02/21/17 11:59 PM. Always use your most recent med list.          amLODipine 5 MG tablet Commonly known as:  NORVASC Take 1 tablet (5 mg total) by mouth daily.   atorvastatin 20 MG tablet Commonly known as:  LIPITOR TAKE 1 TABLET (20 MG TOTAL) BY MOUTH DAILY.   Cholecalciferol 50000 units capsule Take 1 capsule  (50,000 Units total) by mouth once a week.   metFORMIN 500 MG tablet Commonly known as:  GLUCOPHAGE TAKE 1 TABLET BY MOUTH 2 TIMES DAILY WITH A MEAL.   multivitamin with minerals tablet Take 1 tablet by mouth daily.   sildenafil 20 MG tablet Commonly known as:  REVATIO 1-5 tabs 1 hour prior to intercourse   tadalafil 5 MG tablet Commonly known as:  CIALIS Take 1 tablet (5 mg total) by mouth at bedtime.   telmisartan-hydrochlorothiazide 80-25 MG tablet Commonly known as:  MICARDIS HCT Take 1 tablet by mouth daily.       Allergies: No Known Allergies  Family History: Family History  Problem Relation Age of Onset  . Hypertension Mother   . Rheum arthritis Mother   . Alcohol abuse Brother   . Heart attack Sister   . Early death Sister        During childbirth  . Thyroid disease Sister   . Colon cancer Neg Hx   . Prostate cancer Neg Hx     Social History:  reports that  has never smoked. he has never used smokeless tobacco. He reports that he drinks alcohol. He reports that he does not use drugs.  ROS: UROLOGY Frequent Urination?: No Hard to postpone urination?: No Burning/pain with urination?: No Get up at night to urinate?: No Leakage of urine?: No Urine stream starts and stops?: No Trouble  starting stream?: No Do you have to strain to urinate?: No Blood in urine?: Yes Urinary tract infection?: No Sexually transmitted disease?: No Injury to kidneys or bladder?: No Painful intercourse?: No Weak stream?: No Erection problems?: Yes Penile pain?: No  Gastrointestinal Nausea?: No Vomiting?: No Indigestion/heartburn?: No Diarrhea?: No Constipation?: No  Constitutional Fever: No Night sweats?: No Weight loss?: No Fatigue?: No  Skin Skin rash/lesions?: No Itching?: No  Eyes Blurred vision?: No Double vision?: No  Ears/Nose/Throat Sore throat?: No Sinus problems?: No  Hematologic/Lymphatic Swollen glands?: No Easy bruising?:  No  Cardiovascular Leg swelling?: No Chest pain?: No  Respiratory Cough?: No Shortness of breath?: No  Endocrine Excessive thirst?: No  Musculoskeletal Back pain?: No Joint pain?: No  Neurological Headaches?: No Dizziness?: No  Psychologic Depression?: No Anxiety?: No  Physical Exam: BP (!) 163/95 (BP Location: Left Arm, Patient Position: Sitting, Cuff Size: Large)   Pulse 71   Ht 5' 8.75" (1.746 m)   Wt 202 lb 12.8 oz (92 kg)   BMI 30.17 kg/m   Constitutional:  Alert and oriented, No acute distress. HEENT: [place] AT, moist mucus membranes.  Trachea midline, no masses. Cardiovascular: No clubbing, cyanosis, or edema. Respiratory: Normal respiratory effort, no increased work of breathing. GI: Abdomen is soft, nontender, nondistended, no abdominal masses GU: No CVA tenderness.  Prostate 35 g, smooth without nodules. Skin: No rashes, bruises or suspicious lesions. Lymph: No cervical or inguinal adenopathy. Neurologic: Grossly intact, no focal deficits, moving all 4 extremities. Psychiatric: Normal mood and affect.  Laboratory Data: Lab Results  Component Value Date   WBC 7.6 01/10/2017   HGB 15.0 01/10/2017   HCT 44.2 01/10/2017   MCV 87.5 01/10/2017   PLT 275.0 01/10/2017    Lab Results  Component Value Date   CREATININE 1.15 01/10/2017    Lab Results  Component Value Date   TESTOSTERONE 389.90 01/10/2017    Lab Results  Component Value Date   HGBA1C 6.4 01/10/2017    Urinalysis Lab Results  Component Value Date   SPECGRAV 1.025 02/21/2017   PHUR 5.0 02/21/2017   COLORU Yellow 02/21/2017   APPEARANCEUR Clear 02/21/2017   LEUKOCYTESUR Trace (A) 02/21/2017   PROTEINUR Negative 02/21/2017   GLUCOSEU Negative 02/21/2017   KETONESU Negative 02/21/2017   RBCU Negative 02/21/2017   BILIRUBINUR Negative 02/21/2017   UUROB 0.2 02/21/2017   NITRITE Negative 02/21/2017    Lab Results  Component Value Date   LABMICR See below: 02/21/2017   WBCUA  6-10 (A) 02/21/2017   RBCUA None seen 02/21/2017   LABEPIT None seen 02/21/2017   MUCUS Presence of (A) 01/10/2017   BACTERIA None seen 02/21/2017    Assessment & Plan:  1. Hematuria, unspecified type Recent urinalysis have primarily shown pyuria.  His urinalysis today showed no microhematuria or blood on dipstick however there were 6-10 WBCs on microscopy.  He is asymptomatic.  A urine culture was ordered and with his history of stone disease have recommended a KUB and renal ultrasound. - Urinalysis, Complete  2.  Erectile dysfunction He was using Cialis 5 mg as needed with good results however he is awaiting prior authorization.  I sent an Rx of sildenafil 20 mg to try.  3.  Personal history of stone disease KUB/renal ultrasound as above.    [name], [place] [address] [place], [place] [address] [phone]

## 2017-02-24 DIAGNOSIS — Z87442 Personal history of urinary calculi: Secondary | ICD-10-CM

## 2017-02-24 HISTORY — DX: Personal history of urinary calculi: Z87.442

## 2017-02-24 NOTE — Telephone Encounter (Signed)
Left voice mail to call back ok for P[facility] to speak to patient, see below message  

## 2017-02-25 LAB — CULTURE, URINE COMPREHENSIVE

## 2017-02-25 NOTE — Telephone Encounter (Signed)
Left voice mail to call back ok for PEC to speak to patient 

## 2017-02-25 NOTE — Telephone Encounter (Signed)
Spoke with patients wife and she states urology recommended Viagra they sent script for Viagra to [facility] and it was expensive , however he got script .  Wife was wondering could you send another script for future refills into [facility].  Thanks.

## 2017-02-25 NOTE — Telephone Encounter (Signed)
Another option for viagra would be [facility] pharmacy in [place] that delivers but the dose is lower and you get more pills to equal the dose you need  They have lower prices on this medication   Does  He need refills now to [facility] ?  [place]

## 2017-02-27 ENCOUNTER — Other Ambulatory Visit: Payer: Self-pay | Admitting: Internal Medicine

## 2017-02-27 DIAGNOSIS — N529 Male erectile dysfunction, unspecified: Secondary | ICD-10-CM

## 2017-02-27 MED ORDER — SILDENAFIL CITRATE 20 MG PO TABS: ORAL_TABLET | ORAL | 11 refills | Status: DC

## 2017-02-27 NOTE — Telephone Encounter (Signed)
Spoke to patients wife she stated they would rather stick with [facility] pharmacy.  You can go ahead and send in to [facility] and will wait until insurance will go through for refill

## 2017-02-28 ENCOUNTER — Telehealth: Payer: Self-pay

## 2017-02-28 ENCOUNTER — Ambulatory Visit: Payer: 59

## 2017-02-28 NOTE — Telephone Encounter (Signed)
Letter sent.

## 2017-02-28 NOTE — Telephone Encounter (Signed)
-----   Message from [name], MD sent at 02/27/2017 12:17 PM EST ----- Urine culture was negative for infection

## 2017-03-14 ENCOUNTER — Ambulatory Visit: Payer: 59 | Admitting: Internal Medicine

## 2017-03-14 ENCOUNTER — Encounter: Payer: Self-pay | Admitting: Urology

## 2017-03-14 ENCOUNTER — Encounter: Payer: Self-pay | Admitting: Internal Medicine

## 2017-03-14 ENCOUNTER — Ambulatory Visit: Payer: 59 | Admitting: Urology

## 2017-03-14 VITALS — BP 131/78 | HR 72 | Wt 201.6 lb

## 2017-03-14 VITALS — BP 142/84 | HR 71 | Temp 98.0°F | Ht 69.5 in | Wt 201.8 lb

## 2017-03-14 DIAGNOSIS — E119 Type 2 diabetes mellitus without complications: Secondary | ICD-10-CM

## 2017-03-14 DIAGNOSIS — I10 Essential (primary) hypertension: Secondary | ICD-10-CM

## 2017-03-14 DIAGNOSIS — Z87442 Personal history of urinary calculi: Secondary | ICD-10-CM

## 2017-03-14 DIAGNOSIS — L0591 Pilonidal cyst without abscess: Secondary | ICD-10-CM

## 2017-03-14 DIAGNOSIS — Z23 Encounter for immunization: Secondary | ICD-10-CM | POA: Diagnosis not present

## 2017-03-14 DIAGNOSIS — N529 Male erectile dysfunction, unspecified: Secondary | ICD-10-CM | POA: Diagnosis not present

## 2017-03-14 DIAGNOSIS — N200 Calculus of kidney: Secondary | ICD-10-CM

## 2017-03-14 DIAGNOSIS — H524 Presbyopia: Secondary | ICD-10-CM | POA: Diagnosis not present

## 2017-03-14 LAB — HM DIABETES EYE EXAM

## 2017-03-14 NOTE — H&P (View-Only) (Signed)
03/14/2017 3:23 PM   Andrew Stevens 1960-03-12 [id number]  Referring provider: McLean-Scocuzza, Nino Glow, MD [place], [place] [address]  Chief Complaint  Patient presents with  . Follow-up    Pt states that he had xray but was unable to afford u/s     HPI: 57 year old male seen on 02/21/2017 for erectile dysfunction and pyuria/microhematuria with a history of stone disease.  A KUB and renal ultrasound was recommended.  He had his KUB performed however did not have his ultrasound done because of cost.  The KUB is remarkable for a 18 mm calculus in the vicinity of the right proximal ureter and additional right renal calcifications measuring 12 and 7 mm.  There are 10 and 11 mm calculi overlying the left renal outline.  He is asymptomatic and denies flank or abdominal pain.   PMH: Past Medical History:  Diagnosis Date  . Diabetes mellitus without complication ([place])   . Erectile dysfunction   . Hyperlipidemia   . Hypertension   . Lipoma of extremity     Surgical History: Past Surgical History:  Procedure Laterality Date  . BRAIN SURGERY  1989   Cyst removed - benign  . INGUINAL HERNIA REPAIR    . ROTATOR CUFF REPAIR Right 2013   Dr. Rudene Christians   . ROTATOR CUFF REPAIR Right 2014   Dr. Mack Guise  . VENTRAL HERNIA REPAIR      Home Medications:  Allergies as of 03/14/2017   No Known Allergies     Medication List        Accurate as of 03/14/17  3:23 PM. Always use your most recent med list.          amLODipine 5 MG tablet Commonly known as:  NORVASC Take 1 tablet (5 mg total) by mouth daily.   atorvastatin 20 MG tablet Commonly known as:  LIPITOR TAKE 1 TABLET (20 MG TOTAL) BY MOUTH DAILY.   Cholecalciferol 50000 units capsule Take 1 capsule (50,000 Units total) by mouth once a week.   metFORMIN 500 MG tablet Commonly known as:  GLUCOPHAGE TAKE 1 TABLET BY MOUTH 2 TIMES DAILY WITH A MEAL.   multivitamin with minerals tablet Take 1 tablet by  mouth daily.   sildenafil 20 MG tablet Commonly known as:  REVATIO 1-5 tabs 1 hour prior to intercourse   tadalafil 5 MG tablet Commonly known as:  CIALIS Take 1 tablet (5 mg total) by mouth at bedtime.   telmisartan-hydrochlorothiazide 80-25 MG tablet Commonly known as:  MICARDIS HCT Take 1 tablet by mouth daily.       Allergies: No Known Allergies  Family History: Family History  Problem Relation Age of Onset  . Hypertension Mother   . Rheum arthritis Mother   . Alcohol abuse Brother   . Heart attack Sister   . Early death Sister        During childbirth  . Thyroid disease Sister   . Colon cancer Neg Hx   . Prostate cancer Neg Hx     Social History:  reports that  has never smoked. he has never used smokeless tobacco. He reports that he drinks alcohol. He reports that he does not use drugs.  ROS: UROLOGY Frequent Urination?: No Hard to postpone urination?: No Burning/pain with urination?: No Get up at night to urinate?: No Leakage of urine?: No Urine stream starts and stops?: No Trouble starting stream?: No Do you have to strain to urinate?: No Blood in urine?: No Urinary tract  infection?: No Sexually transmitted disease?: No Injury to kidneys or bladder?: No Painful intercourse?: No Weak stream?: No Erection problems?: No Penile pain?: No  Gastrointestinal Nausea?: No Vomiting?: No Indigestion/heartburn?: No Diarrhea?: No Constipation?: No  Constitutional Fever: No Night sweats?: No Weight loss?: No Fatigue?: No  Skin Skin rash/lesions?: No Itching?: No  Eyes Blurred vision?: No Double vision?: No  Ears/Nose/Throat Sore throat?: No Sinus problems?: No  Hematologic/Lymphatic Swollen glands?: No Easy bruising?: No  Cardiovascular Leg swelling?: No Chest pain?: No  Respiratory Cough?: No Shortness of breath?: No  Endocrine Excessive thirst?: No  Musculoskeletal Back pain?: No Joint pain?: No  Neurological Headaches?:  No Dizziness?: No  Psychologic Depression?: No Anxiety?: No  Physical Exam: BP 131/78 (BP Location: Left Arm, Patient Position: Sitting, Cuff Size: Large)   Pulse 72   Wt 201 lb 9.6 oz (91.4 kg)   SpO2 99%   BMI 29.99 kg/m   Constitutional:  Alert and oriented, No acute distress. HEENT: [place] AT, moist mucus membranes.  Trachea midline, no masses. Cardiovascular: No clubbing, cyanosis, or edema. Respiratory: Normal respiratory effort, no increased work of breathing. GI: Abdomen is soft, nontender, nondistended, no abdominal masses GU: No CVA tenderness. Skin: No rashes, bruises or suspicious lesions. Lymph: No cervical or inguinal adenopathy. Neurologic: Grossly intact, no focal deficits, moving all 4 extremities. Psychiatric: Normal mood and affect.  Laboratory Data: Lab Results  Component Value Date   WBC 7.6 01/10/2017   HGB 15.0 01/10/2017   HCT 44.2 01/10/2017   MCV 87.5 01/10/2017   PLT 275.0 01/10/2017    Lab Results  Component Value Date   CREATININE 1.15 01/10/2017    Lab Results  Component Value Date   TESTOSTERONE 389.90 01/10/2017    Lab Results  Component Value Date   HGBA1C 6.4 01/10/2017    Urinalysis Lab Results  Component Value Date   SPECGRAV 1.025 02/21/2017   PHUR 5.0 02/21/2017   COLORU Yellow 02/21/2017   APPEARANCEUR Clear 02/21/2017   LEUKOCYTESUR Trace (A) 02/21/2017   PROTEINUR Negative 02/21/2017   GLUCOSEU Negative 02/21/2017   KETONESU Negative 02/21/2017   RBCU Negative 02/21/2017   BILIRUBINUR Negative 02/21/2017   UUROB 0.2 02/21/2017   NITRITE Negative 02/21/2017    Lab Results  Component Value Date   LABMICR See below: 02/21/2017   WBCUA 6-10 (A) 02/21/2017   RBCUA None seen 02/21/2017   LABEPIT None seen 02/21/2017   MUCUS Presence of (A) 01/10/2017   BACTERIA None seen 02/21/2017    Pertinent Imaging: Images reviewed Results for orders placed during the hospital encounter of 02/21/17  Abdomen 1 view (KUB)    Narrative CLINICAL DATA:  Hematuria  EXAM: ABDOMEN - 1 VIEW  COMPARISON:  09/11/2009 unenhanced CT abdomen/pelvis  FINDINGS: There are multiple clustered stones in mid to lower right kidney, largest 12 mm and 7 mm. There is a 19 x 10 mm stone overlying the expected location of the right ureteropelvic junction. There are multiple clustered stones in mid to lower left kidney, largest 10 mm and 11 mm. No definite urolithiasis in the pelvis. No dilated small bowel loops. Mild stool throughout the large bowel. No evidence of pneumatosis or pneumoperitoneum. Mild-to-moderate lumbar spondylosis.  IMPRESSION: Bilateral nephrolithiasis. A 19 x 10 mm stone overlies the expected location of the right UPJ, cannot exclude an obstructing right urinary tract stone. Correlate with unenhanced CT abdomen/pelvis as clinically warranted.   Electronically Signed   By: Ilona Sorrel M.D.   On: 02/22/2017 17:21  Assessment & Plan:   57 year old male with bilateral nephrolithiasis with significant stone burden.  Treatment is recommended for these calculi.  For the right side he was informed the procedure with a high success rate would be PCNL.  Ureteroscopy was discussed however would most likely need to be staged.  He works in the [place] at [employer] and would prefer to have his surgery done in [place].  He is scheduled for pilonidal cyst surgery in March.  He will have his renal ultrasound performed and if no significant hydronephrosis would like to defer his kidney stone surgery until after his scheduled pending surgery.    [name], [place] [address] [place], [place] [address] [phone]

## 2017-03-14 NOTE — Progress Notes (Signed)
03/14/2017 3:23 PM   [name] 10/06/1960 [id number]  Referring provider: McLean-Scocuzza, Nino Glow, MD [place], [place] [id number]  Chief Complaint  Patient presents with  . Follow-up    Pt states that he had xray but was unable to afford u/s     HPI: 57 year old male seen on 02/21/2017 for erectile dysfunction and pyuria/microhematuria with a history of stone disease.  A KUB and renal ultrasound was recommended.  He had his KUB performed however did not have his ultrasound done because of cost.  The KUB is remarkable for a 18 mm calculus in the vicinity of the right proximal ureter and additional right renal calcifications measuring 12 and 7 mm.  There are 10 and 11 mm calculi overlying the left renal outline.  He is asymptomatic and denies flank or abdominal pain.   PMH: Past Medical History:  Diagnosis Date  . Diabetes mellitus without complication ([place])   . Erectile dysfunction   . Hyperlipidemia   . Hypertension   . Lipoma of extremity     Surgical History: Past Surgical History:  Procedure Laterality Date  . BRAIN SURGERY  1989   Cyst removed - benign  . INGUINAL HERNIA REPAIR    . ROTATOR CUFF REPAIR Right 2013   Dr. Rudene Christians   . ROTATOR CUFF REPAIR Right 2014   Dr. Mack Guise  . VENTRAL HERNIA REPAIR      Home Medications:  Allergies as of 03/14/2017   No Known Allergies     Medication List        Accurate as of 03/14/17  3:23 PM. Always use your most recent med list.          amLODipine 5 MG tablet Commonly known as:  NORVASC Take 1 tablet (5 mg total) by mouth daily.   atorvastatin 20 MG tablet Commonly known as:  LIPITOR TAKE 1 TABLET (20 MG TOTAL) BY MOUTH DAILY.   Cholecalciferol 50000 units capsule Take 1 capsule (50,000 Units total) by mouth once a week.   metFORMIN 500 MG tablet Commonly known as:  GLUCOPHAGE TAKE 1 TABLET BY MOUTH 2 TIMES DAILY WITH A MEAL.   multivitamin with minerals tablet Take 1 tablet by  mouth daily.   sildenafil 20 MG tablet Commonly known as:  REVATIO 1-5 tabs 1 hour prior to intercourse   tadalafil 5 MG tablet Commonly known as:  CIALIS Take 1 tablet (5 mg total) by mouth at bedtime.   telmisartan-hydrochlorothiazide 80-25 MG tablet Commonly known as:  MICARDIS HCT Take 1 tablet by mouth daily.       Allergies: No Known Allergies  Family History: Family History  Problem Relation Age of Onset  . Hypertension Mother   . Rheum arthritis Mother   . Alcohol abuse Brother   . Heart attack Sister   . Early death Sister        During childbirth  . Thyroid disease Sister   . Colon cancer Neg Hx   . Prostate cancer Neg Hx     Social History:  reports that  has never smoked. he has never used smokeless tobacco. He reports that he drinks alcohol. He reports that he does not use drugs.  ROS: UROLOGY Frequent Urination?: No Hard to postpone urination?: No Burning/pain with urination?: No Get up at night to urinate?: No Leakage of urine?: No Urine stream starts and stops?: No Trouble starting stream?: No Do you have to strain to urinate?: No Blood in urine?: No Urinary tract  infection?: No Sexually transmitted disease?: No Injury to kidneys or bladder?: No Painful intercourse?: No Weak stream?: No Erection problems?: No Penile pain?: No  Gastrointestinal Nausea?: No Vomiting?: No Indigestion/heartburn?: No Diarrhea?: No Constipation?: No  Constitutional Fever: No Night sweats?: No Weight loss?: No Fatigue?: No  Skin Skin rash/lesions?: No Itching?: No  Eyes Blurred vision?: No Double vision?: No  Ears/Nose/Throat Sore throat?: No Sinus problems?: No  Hematologic/Lymphatic Swollen glands?: No Easy bruising?: No  Cardiovascular Leg swelling?: No Chest pain?: No  Respiratory Cough?: No Shortness of breath?: No  Endocrine Excessive thirst?: No  Musculoskeletal Back pain?: No Joint pain?: No  Neurological Headaches?:  No Dizziness?: No  Psychologic Depression?: No Anxiety?: No  Physical Exam: BP 131/78 (BP Location: Left Arm, Patient Position: Sitting, Cuff Size: Large)   Pulse 72   Wt 201 lb 9.6 oz (91.4 kg)   SpO2 99%   BMI 29.99 kg/m   Constitutional:  Alert and oriented, No acute distress. HEENT: [place] AT, moist mucus membranes.  Trachea midline, no masses. Cardiovascular: No clubbing, cyanosis, or edema. Respiratory: Normal respiratory effort, no increased work of breathing. GI: Abdomen is soft, nontender, nondistended, no abdominal masses GU: No CVA tenderness. Skin: No rashes, bruises or suspicious lesions. Lymph: No cervical or inguinal adenopathy. Neurologic: Grossly intact, no focal deficits, moving all 4 extremities. Psychiatric: Normal mood and affect.  Laboratory Data: Lab Results  Component Value Date   WBC 7.6 01/10/2017   HGB 15.0 01/10/2017   HCT 44.2 01/10/2017   MCV 87.5 01/10/2017   PLT 275.0 01/10/2017    Lab Results  Component Value Date   CREATININE 1.15 01/10/2017    Lab Results  Component Value Date   TESTOSTERONE 389.90 01/10/2017    Lab Results  Component Value Date   HGBA1C 6.4 01/10/2017    Urinalysis Lab Results  Component Value Date   SPECGRAV 1.025 02/21/2017   PHUR 5.0 02/21/2017   COLORU Yellow 02/21/2017   APPEARANCEUR Clear 02/21/2017   LEUKOCYTESUR Trace (A) 02/21/2017   PROTEINUR Negative 02/21/2017   GLUCOSEU Negative 02/21/2017   KETONESU Negative 02/21/2017   RBCU Negative 02/21/2017   BILIRUBINUR Negative 02/21/2017   UUROB 0.2 02/21/2017   NITRITE Negative 02/21/2017    Lab Results  Component Value Date   LABMICR See below: 02/21/2017   WBCUA 6-10 (A) 02/21/2017   RBCUA None seen 02/21/2017   LABEPIT None seen 02/21/2017   MUCUS Presence of (A) 01/10/2017   BACTERIA None seen 02/21/2017    Pertinent Imaging: Images reviewed Results for orders placed during the hospital encounter of 02/21/17  Abdomen 1 view (KUB)    Narrative CLINICAL DATA:  Hematuria  EXAM: ABDOMEN - 1 VIEW  COMPARISON:  09/11/2009 unenhanced CT abdomen/pelvis  FINDINGS: There are multiple clustered stones in mid to lower right kidney, largest 12 mm and 7 mm. There is a 19 x 10 mm stone overlying the expected location of the right ureteropelvic junction. There are multiple clustered stones in mid to lower left kidney, largest 10 mm and 11 mm. No definite urolithiasis in the pelvis. No dilated small bowel loops. Mild stool throughout the large bowel. No evidence of pneumatosis or pneumoperitoneum. Mild-to-moderate lumbar spondylosis.  IMPRESSION: Bilateral nephrolithiasis. A 19 x 10 mm stone overlies the expected location of the right UPJ, cannot exclude an obstructing right urinary tract stone. Correlate with unenhanced CT abdomen/pelvis as clinically warranted.   Electronically Signed   By: Ilona Sorrel M.D.   On: 02/22/2017 17:21  Assessment & Plan:   57 year old male with bilateral nephrolithiasis with significant stone burden.  Treatment is recommended for these calculi.  For the right side he was informed the procedure with a high success rate would be PCNL.  Ureteroscopy was discussed however would most likely need to be staged.  He works in the [place] at [employer] and would prefer to have his surgery done in [place].  He is scheduled for pilonidal cyst surgery in March.  He will have his renal ultrasound performed and if no significant hydronephrosis would like to defer his kidney stone surgery until after his scheduled pending surgery.    [name], [place] [address] [place], [place] [address] [phone]

## 2017-03-14 NOTE — Progress Notes (Signed)
Chief Complaint  Patient presents with  . Follow-up   Follow up  1. HTN logging BP at home 112-165/78/95 mostly controlled wife checking he does admit getting nervous at MD appts and BP slightly elevated today on Norvasc 5 mg qd, micardis 80-25 mg qd.   2. Kidneys stones reviewed KUB 01/2017 +multiple >1 cm kidney stones b/l f/u Dr. [name] who wants him to sch [place] and laser surgery  3. Rectal masses and lipoma appt with Dr. Hassell Done in [place] 04/04/17 CCS 4. ED Sildenafil 20 mg is working ok for now  5. DM 2 A1C 6.4 had eye appt today will need to get report     Review of Systems  Constitutional: Negative for weight loss.  Respiratory: Negative for shortness of breath.   Cardiovascular: Negative for chest pain.  Gastrointestinal: Negative for abdominal pain.  Genitourinary:       No pain from kidney stones   Skin:       +skin lesions   Neurological: Negative for headaches.  Psychiatric/Behavioral: The patient is nervous/anxious.    Past Medical History:  Diagnosis Date  . Anxiety   . Diabetes mellitus without complication ([place])   . Erectile dysfunction   . Hyperlipidemia   . Hypertension   . Kidney stones   . Lipoma of extremity    Past Surgical History:  Procedure Laterality Date  . BRAIN SURGERY  1989   Cyst removed - benign  . INGUINAL HERNIA REPAIR    . ROTATOR CUFF REPAIR Right 2013   Dr. Rudene Christians   . ROTATOR CUFF REPAIR Right 2014   Dr. Mack Guise  . VENTRAL HERNIA REPAIR     Family History  Problem Relation Age of Onset  . Hypertension Mother   . Rheum arthritis Mother   . Alcohol abuse Brother   . Heart attack Sister   . Early death Sister        During childbirth  . Thyroid disease Sister   . Colon cancer Neg Hx   . Prostate cancer Neg Hx    Social History   Socioeconomic History  . Marital status: Married    Spouse name: Not on file  . Number of children: 2  . Years of education: Not on file  . Highest education level: Not on file  Social Needs  .  Financial resource strain: Not on file  . Food insecurity - worry: Not on file  . Food insecurity - inability: Not on file  . Transportation needs - medical: Not on file  . Transportation needs - non-medical: Not on file  Occupational History  . 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 Smoking status: Never Smoker  . Smokeless tobacco: Never Used  Substance and Sexual Activity  . Alcohol use: Yes    Alcohol/week: 0.0 oz    Comment: Occasional  . Drug use: No  . Sexual activity: Yes    Comment: wife   Other Topics Concern  . Not on file  Social History Narrative   Lives in [place] with wife. Dog. Kennedy.      3 grandchildren, 2 children, married to wife x 45 years as of 01/10/17    From [facility]      Regular Exercise -  NO   Daily Caffeine Use:  4 cups coffee in AM, 1 cup tea in AM         Current Meds  Medication Sig  . amLODipine (NORVASC) 5 MG tablet Take  1 tablet (5 mg total) by mouth daily.  Marland Kitchen atorvastatin (LIPITOR) 20 MG tablet TAKE 1 TABLET (20 MG TOTAL) BY MOUTH DAILY.  Marland Kitchen Cholecalciferol 50000 units capsule Take 1 capsule (50,000 Units total) by mouth once a week.  . metFORMIN (GLUCOPHAGE) 500 MG tablet TAKE 1 TABLET BY MOUTH 2 TIMES DAILY WITH A MEAL.  . sildenafil (REVATIO) 20 MG tablet 1-5 tabs 1 hour prior to intercourse  . telmisartan-hydrochlorothiazide (MICARDIS HCT) 80-25 MG tablet Take 1 tablet by mouth daily.   No Known Allergies Recent Results (from the past 2160 hour(s))  Comprehensive metabolic panel     Status: Abnormal   Collection Time: 01/10/17 12:05 PM  Result Value Ref Range   Sodium 142 135 - 145 mEq/L   Potassium 4.2 3.5 - 5.1 mEq/L   Chloride 104 96 - 112 mEq/L   CO2 33 (H) 19 - 32 mEq/L   Glucose, Bld 113 (H) 70 - 99 mg/dL   BUN 17 6 - 23 mg/dL   Creatinine, Ser 1.15 0.40 - 1.50 mg/dL   Total Bilirubin 0.8 0.2 - 1.2 mg/dL   Alkaline Phosphatase 62 39 - 117 U/L   AST 21 0 - 37 U/L   ALT 19 0 - 53 U/L   Total  Protein 7.3 6.0 - 8.3 g/dL   Albumin 4.3 3.5 - 5.2 g/dL   Calcium 9.3 8.4 - 10.5 mg/dL   GFR 69.38 >60.00 mL/min  CBC with Differential/Platelet     Status: Abnormal   Collection Time: 01/10/17 12:05 PM  Result Value Ref Range   WBC 7.6 4.0 - 10.5 K/uL   RBC 5.05 4.22 - 5.81 Mil/uL   Hemoglobin 15.0 13.0 - 17.0 g/dL   HCT 44.2 39.0 - 52.0 %   MCV 87.5 78.0 - 100.0 fl   MCHC 34.1 30.0 - 36.0 g/dL   RDW 13.4 11.5 - 15.5 %   Platelets 275.0 150.0 - 400.0 K/uL   Neutrophils Relative % 80.3 (H) 43.0 - 77.0 %   Lymphocytes Relative 12.9 12.0 - 46.0 %   Monocytes Relative 3.9 3.0 - 12.0 %   Eosinophils Relative 2.0 0.0 - 5.0 %   Basophils Relative 0.9 0.0 - 3.0 %   Neutro Abs 6.1 1.4 - 7.7 K/uL   Lymphs Abs 1.0 0.7 - 4.0 K/uL   Monocytes Absolute 0.3 0.1 - 1.0 K/uL   Eosinophils Absolute 0.2 0.0 - 0.7 K/uL   Basophils Absolute 0.1 0.0 - 0.1 K/uL  Lipid panel     Status: None   Collection Time: 01/10/17 12:05 PM  Result Value Ref Range   Cholesterol 103 0 - 200 mg/dL    Comment: ATP III Classification       Desirable:  < 200 mg/dL               Borderline High:  200 - 239 mg/dL          High:  > = 240 mg/dL   Triglycerides 47.0 0.0 - 149.0 mg/dL    Comment: Normal:  <150 mg/dLBorderline High:  150 - 199 mg/dL   HDL 44.20 >39.00 mg/dL   VLDL 9.4 0.0 - 40.0 mg/dL   LDL Cholesterol 49 0 - 99 mg/dL   Total CHOL/HDL Ratio 2     Comment:                Men          Women1/2 Average Risk     3.4  3.3Average Risk          5.0          4.42X Average Risk          9.6          7.13X Average Risk          15.0          11.0                       NonHDL 58.85     Comment: NOTE:  Non-HDL goal should be 30 mg/dL higher than patient's LDL goal (i.e. LDL goal of < 70 mg/dL, would have non-HDL goal of < 100 mg/dL)  Hemoglobin A1c     Status: None   Collection Time: 01/10/17 12:05 PM  Result Value Ref Range   Hgb A1c MFr Bld 6.4 4.6 - 6.5 %    Comment: Glycemic Control Guidelines for People  with Diabetes:Non Diabetic:  <6%Goal of Therapy: <7%Additional Action Suggested:  >8%   Urinalysis, Routine w reflex microscopic     Status: Abnormal   Collection Time: 01/10/17 12:05 PM  Result Value Ref Range   Color, Urine YELLOW Yellow;Lt. Yellow   APPearance CLEAR Clear   Specific Gravity, Urine 1.020 1.000 - 1.030   pH 6.0 5.0 - 8.0   Total Protein, Urine NEGATIVE Negative   Urine Glucose NEGATIVE Negative   Ketones, ur NEGATIVE Negative   Bilirubin Urine NEGATIVE Negative   Hgb urine dipstick SMALL (A) Negative   Urobilinogen, UA 0.2 0.0 - 1.0   Leukocytes, UA NEGATIVE Negative   Nitrite NEGATIVE Negative   WBC, UA 7-10/hpf (A) 0-2/hpf   RBC / HPF 3-6/hpf (A) 0-2/hpf   Mucus, UA Presence of (A) None   Squamous Epithelial / LPF Rare(0-4/hpf) Rare(0-4/hpf)  Urine Microalbumin w/creat. ratio     Status: Abnormal   Collection Time: 01/10/17 12:05 PM  Result Value Ref Range   Microalb, Ur 3.0 (H) 0.0 - 1.9 mg/dL   Creatinine,U 115.0 mg/dL   Microalb Creat Ratio 2.6 0.0 - 30.0 mg/g  T4, free     Status: None   Collection Time: 01/10/17 12:05 PM  Result Value Ref Range   Free T4 0.96 0.60 - 1.60 ng/dL    Comment: Specimens from patients who are undergoing biotin therapy and /or ingesting biotin supplements may contain high levels of biotin.  The higher biotin concentration in these specimens interferes with this Free T4 assay.  Specimens that contain high levels  of biotin may cause false high results for this Free T4 assay.  Please interpret results in light of the total clinical presentation of the patient.    TSH     Status: None   Collection Time: 01/10/17 12:05 PM  Result Value Ref Range   TSH 0.86 0.35 - 4.50 uIU/mL  Testosterone     Status: None   Collection Time: 01/10/17 12:05 PM  Result Value Ref Range   Testosterone 389.90 300.00 - 890.00 ng/dL  PSA     Status: None   Collection Time: 01/10/17 12:05 PM  Result Value Ref Range   PSA 1.59 0.10 - 4.00 ng/mL     Comment: Test performed using Access Hybritech PSA Assay, a parmagnetic partical, chemiluminecent immunoassay.  Hepatitis B surface antibody     Status: None   Collection Time: 01/10/17 12:05 PM  Result Value Ref Range   Hepatitis B-Post 126 > OR = 10 mIU/mL  Comment: . Patient has immunity to hepatitis B virus. . For additional information, please refer to http://education.questdiagnostics.com/faq/FAQ105 (This link is being provided for informational/ educational purposes only).   VITAMIN D 25 Hydroxy (Vit-D Deficiency, Fractures)     Status: Abnormal   Collection Time: 01/10/17 12:05 PM  Result Value Ref Range   VITD 18.35 (L) 30.00 - 100.00 ng/mL  Hepatitis B surface antigen     Status: None   Collection Time: 01/10/17 12:05 PM  Result Value Ref Range   Hepatitis B Surface Ag NON-REACTIVE NON-REACTI  Hepatitis B core antibody, total     Status: None   Collection Time: 01/10/17 12:05 PM  Result Value Ref Range   Hep B Core Total Ab NON-REACTIVE NON-REACTI  Urinalysis, Routine w reflex microscopic     Status: None   Collection Time: 01/31/17 11:10 AM  Result Value Ref Range   Color, Urine YELLOW Yellow;Lt. Yellow   APPearance CLEAR Clear   Specific Gravity, Urine 1.020 1.000 - 1.030   pH 7.0 5.0 - 8.0   Total Protein, Urine NEGATIVE Negative   Urine Glucose NEGATIVE Negative   Ketones, ur NEGATIVE Negative   Bilirubin Urine NEGATIVE Negative   Hgb urine dipstick NEGATIVE Negative   Urobilinogen, UA 0.2 0.0 - 1.0   Leukocytes, UA NEGATIVE Negative   Nitrite NEGATIVE Negative   WBC, UA 0-2/hpf 0-2/hpf   RBC / HPF none seen 0-2/hpf   Squamous Epithelial / LPF Rare(0-4/hpf) Rare(0-4/hpf)  Urinalysis, Complete     Status: Abnormal   Collection Time: 02/21/17  3:02 PM  Result Value Ref Range   Specific Gravity, UA 1.025 1.005 - 1.030   pH, UA 5.0 5.0 - 7.5   Color, UA Yellow Yellow   Appearance Ur Clear Clear   Leukocytes, UA Trace (A) Negative   Protein, UA Negative  Negative/Trace   Glucose, UA Negative Negative   Ketones, UA Negative Negative   RBC, UA Negative Negative   Bilirubin, UA Negative Negative   Urobilinogen, Ur 0.2 0.2 - 1.0 mg/dL   Nitrite, UA Negative Negative   Microscopic Examination See below:   Microscopic Examination     Status: Abnormal   Collection Time: 02/21/17  3:02 PM  Result Value Ref Range   WBC, UA 6-10 (A) 0 - 5 /hpf   RBC, UA None seen 0 - 2 /hpf   Epithelial Cells (non renal) None seen 0 - 10 /hpf   Bacteria, UA None seen None seen/Few  CULTURE, URINE COMPREHENSIVE     Status: None   Collection Time: 02/21/17  4:00 PM  Result Value Ref Range   Urine Culture, Comprehensive Final report    Organism ID, Bacteria Comment     Comment: No growth in 36 - 48 hours.   Objective  Body mass index is 29.37 kg/m. Wt Readings from Last 3 Encounters:  03/14/17 201 lb 12.8 oz (91.5 kg)  03/14/17 201 lb 9.6 oz (91.4 kg)  02/21/17 202 lb 12.8 oz (92 kg)   Temp Readings from Last 3 Encounters:  03/14/17 98 F (36.7 C) (Oral)  01/31/17 98 F (36.7 C) (Oral)  01/10/17 98.3 F (36.8 C)   BP Readings from Last 3 Encounters:  03/14/17 (!) 142/84  03/14/17 131/78  02/21/17 (!) 163/95   Pulse Readings from Last 3 Encounters:  03/14/17 71  03/14/17 72  02/21/17 71   O2 sat room air 97%  Physical Exam  Constitutional: He is oriented to person, place, and time and well-developed,  well-nourished, and in no distress.  HENT:  Head: Normocephalic and atraumatic.  Eyes: Conjunctivae are normal. Pupils are equal, round, and reactive to light.  Cardiovascular: Normal rate, regular rhythm and normal heart sounds.  Pulmonary/Chest: Effort normal and breath sounds normal.  Neurological: He is alert and oriented to person, place, and time. Gait normal.  Skin: Skin is warm and dry.  Psychiatric: Mood, affect and judgment normal.  Nursing note and vitals reviewed.   Assessment   1. HTN  2.kidney stones  3. Rectal mass,  lipomas, scrotal cyst  4. ED 5. DM 2  6. HM Plan  1. Cont norvasc 5 mg qd for now with Micardis 80 mg-hctz 25 mg qd  Due to #2 consider change hctz to chlorthalidone in future  2. F/u Dr. [name]  Given diet list for kidney stones.  3. F/u Dr. Marla Roe Sug in [place] 04/04/17  4. Sildenafil 20 mg prn  5. Cont metformin  Get records recent eye exam  Will do foot exam in future  6.  Had flu shot  Tdap given today  pna 23 UTD  shingrix ready to pick up at pharmacy and will sch RN visit here   UTD colonoscopy UTD PSA.   Provider: Dr. Olivia Mackie McLean-Scocuzza-Internal Medicine

## 2017-03-14 NOTE — Progress Notes (Signed)
Pre visit review using our clinic review tool, if applicable. No additional management support is needed unless otherwise documented below in the visit note. 

## 2017-03-14 NOTE — Patient Instructions (Addendum)
Please f/u in 3-4 months   Kidney Stones Kidney stones (urolithiasis) are solid, rock-like deposits that form inside of the organs that make urine (kidneys). A kidney stone may form in a kidney and move into the bladder, where it can cause intense pain and block the flow of urine. Kidney stones are created when high levels of certain minerals are found in the urine. They are usually passed through urination, but in some cases, medical treatment may be needed to remove them. What are the causes? Kidney stones may be caused by:  A condition in which certain glands produce too much parathyroid hormone (primary hyperparathyroidism), which causes too much calcium buildup in the blood.  Buildup of uric acid crystals in the bladder (hyperuricosuria). Uric acid is a chemical that the body produces when you eat certain foods. It usually exits the body in the urine.  Narrowing (stricture) of one or both of the tubes that drain urine from the kidneys to the bladder (ureters).  A kidney blockage that is present at birth (congenital obstruction).  Past surgery on the kidney or the ureters, such as gastric bypass surgery.  What increases the risk? The following factors make you more likely to develop kidney stones:  Having had a kidney stone in the past.  Having a family history of kidney stones.  Not drinking enough water.  Eating a diet that is high in protein, salt (sodium), or sugar.  Being overweight or obese.  What are the signs or symptoms? Symptoms of a kidney stone may include:  Nausea.  Vomiting.  Blood in the urine (hematuria).  Pain in the side of the abdomen, right below the ribs (flank pain). Pain usually spreads (radiates) to the groin.  Needing to urinate frequently or urgently.  How is this diagnosed? This condition may be diagnosed based on:  Your medical history.  A physical exam.  Blood tests.  Urine tests.  CT scan.  Abdominal X-ray.  A procedure to  examine the inside of the bladder (cystoscopy).  How is this treated? Treatment for kidney stones depends on the size, location, and makeup of the stones. Treatment may involve:  Analyzing your urine before and after you pass the stone through urination.  Being monitored at the hospital until you pass the stone through urination.  Increasing your fluid intake and decreasing the amount of calcium and protein in your diet.  A procedure to break up kidney stones in the bladder using: ? A focused beam of light (laser therapy). ? Shock waves (extracorporeal shock wave lithotripsy).  Surgery to remove kidney stones. This may be needed if you have severe pain or have stones that block your urinary tract.  Follow these instructions at home: Eating and drinking   Drink enough fluid to keep your urine clear or pale yellow. This will help you to pass the kidney stone.  If directed, change your diet. This may include: ? Limiting how much sodium you eat. ? Eating more fruits and vegetables. ? Limiting how much meat, poultry, fish, and eggs you eat.  Follow instructions from your health care provider about eating or drinking restrictions. General instructions  Collect urine samples as told by your health care provider. You may need to collect a urine sample: ? 24 hours after you pass the stone. ? 8-12 weeks after passing the kidney stone, and every 6-12 months after that.  Strain your urine every time you urinate, for as long as directed. Use the strainer that your health care  provider recommends.  Do not throw out the kidney stone after passing it. Keep the stone so it can be tested by your health care provider. Testing the makeup of your kidney stone may help prevent you from getting kidney stones in the future.  Take over-the-counter and prescription medicines only as told by your health care provider.  Keep all follow-up visits as told by your health care provider. This is important.  You may need follow-up X-rays or ultrasounds to make sure that your stone has passed. How is this prevented? To prevent another kidney stone:  Drink enough fluid to keep your urine clear or pale yellow. This is the best way to prevent kidney stones.  Eat a healthy diet and follow recommendations from your health care provider about foods to avoid. You may be instructed to eat a low-protein diet. Recommendations vary depending on the type of kidney stone that you have.  Maintain a healthy weight.  Contact a health care provider if:  You have pain that gets worse or does not get better with medicine. Get help right away if:  You have a fever or chills.  You develop severe pain.  You develop new abdominal pain.  You faint.  You are unable to urinate. This information is not intended to replace advice given to you by your health care provider. Make sure you discuss any questions you have with your health care provider. Document Released: 01/14/2005 Document Revised: 08/04/2015 Document Reviewed: 06/30/2015 Elsevier Interactive Patient Education  [employer].

## 2017-03-17 ENCOUNTER — Encounter: Payer: Self-pay | Admitting: Urology

## 2017-03-17 DIAGNOSIS — N200 Calculus of kidney: Secondary | ICD-10-CM | POA: Insufficient documentation

## 2017-03-21 ENCOUNTER — Ambulatory Visit
Admission: RE | Admit: 2017-03-21 | Discharge: 2017-03-21 | Disposition: A | Discharge status: Home / Self Care | Payer: 59 | Source: Ambulatory Visit | Attending: Urology | Admitting: Urology

## 2017-03-21 DIAGNOSIS — R319 Hematuria, unspecified: Secondary | ICD-10-CM | POA: Diagnosis not present

## 2017-03-21 DIAGNOSIS — N132 Hydronephrosis with renal and ureteral calculous obstruction: Secondary | ICD-10-CM | POA: Diagnosis not present

## 2017-03-26 ENCOUNTER — Other Ambulatory Visit: Payer: Self-pay | Admitting: Internal Medicine

## 2017-03-26 NOTE — Progress Notes (Signed)
[place] [facility] 03/14/17   No DM retinopathy  TMS

## 2017-03-27 ENCOUNTER — Telehealth: Payer: Self-pay

## 2017-03-27 ENCOUNTER — Other Ambulatory Visit: Payer: Self-pay | Admitting: Radiology

## 2017-03-27 DIAGNOSIS — N200 Calculus of kidney: Secondary | ICD-10-CM

## 2017-03-27 NOTE — Telephone Encounter (Signed)
Pt has been made aware. 

## 2017-03-27 NOTE — Telephone Encounter (Signed)
-----   Message from [name], MD sent at 03/23/2017  8:54 AM EST ----- Renal ultrasound does show evidence of right kidney blockage.  He works in the [employer] and wanted to have his surgery set up in [place].  Please schedule appointment with Dr. Pilar Jarvis.

## 2017-03-28 ENCOUNTER — Ambulatory Visit: Payer: 59

## 2017-04-04 ENCOUNTER — Other Ambulatory Visit: Payer: Self-pay

## 2017-04-04 ENCOUNTER — Encounter
Admission: RE | Admit: 2017-04-04 | Discharge: 2017-04-04 | Disposition: A | Discharge status: Home / Self Care | Payer: 59 | Source: Ambulatory Visit | Attending: Urology | Admitting: Urology

## 2017-04-04 DIAGNOSIS — Z01818 Encounter for other preprocedural examination: Secondary | ICD-10-CM | POA: Diagnosis not present

## 2017-04-04 DIAGNOSIS — E785 Hyperlipidemia, unspecified: Secondary | ICD-10-CM | POA: Insufficient documentation

## 2017-04-04 DIAGNOSIS — I10 Essential (primary) hypertension: Secondary | ICD-10-CM | POA: Diagnosis not present

## 2017-04-04 DIAGNOSIS — E119 Type 2 diabetes mellitus without complications: Secondary | ICD-10-CM | POA: Insufficient documentation

## 2017-04-04 DIAGNOSIS — D179 Benign lipomatous neoplasm, unspecified: Secondary | ICD-10-CM | POA: Diagnosis not present

## 2017-04-04 HISTORY — DX: Personal history of urinary calculi: Z87.442

## 2017-04-04 NOTE — Patient Instructions (Signed)
Your procedure is scheduled on: Friday 04/11/17 Report to [facility]. To find out your arrival time please call [phone] between 1PM - 3PM on Thursday 04/10/17.  Remember: Instructions that are not followed completely may result in serious medical risk, up to and including death, or upon the discretion of your surgeon and anesthesiologist your surgery may need to be rescheduled.     _X__ 1. Do not eat food after midnight the night before your procedure.                 No gum chewing or hard candies. You may drink clear liquids up to 2 hours                 before you are scheduled to arrive for your surgery- DO not drink clear                 liquids within 2 hours of the start of your surgery.                 Clear Liquids include:  water, apple juice without pulp, clear carbohydrate                 drink such as Clearfast or Gatorade, Black Coffee or Tea (Do not add                 anything to coffee or tea).  __X__2.  On the morning of surgery brush your teeth with toothpaste and water, you                 may rinse your mouth with mouthwash if you wish.  Do not swallow any              toothpaste of mouthwash.     _X__ 3.  No Alcohol for 24 hours before or after surgery.   _X__ 4.  Do Not Smoke or use e-cigarettes For 24 Hours Prior to Your Surgery.                 Do not use any chewable tobacco products for at least 6 hours prior to                 surgery.  ____  5.  Bring all medications with you on the day of surgery if instructed.   __X__  6.  Notify your doctor if there is any change in your medical condition      (cold, fever, infections).     Do not wear jewelry, make-up, hairpins, clips or nail polish. Do not wear lotions, powders, or perfumes.  Do not shave 48 hours prior to surgery. Men may shave face and neck. Do not bring valuables to the hospital.    [facility] is not responsible for any belongings or  valuables.  Contacts, dentures/partials or body piercings may not be worn into surgery. Bring a case for your contacts, glasses or hearing aids, a denture cup will be supplied. Leave your suitcase in the car. After surgery it may be brought to your room. For patients admitted to the hospital, discharge time is determined by your treatment team.   Patients discharged the day of surgery will not be allowed to drive home.   Please read over the following fact sheets that you were given:   MRSA Information  __X__ Take these medicines the morning of surgery with A SIP OF WATER:  1. AMLODIPINE  2. ATORVASTATIN  3.   4.  5.  6.  ____ Fleet Enema (as directed)   ____ Use CHG Soap/SAGE wipes as directed  ____ Use inhalers on the day of surgery  __X__ Stop metformin/Janumet/Farxiga 2 days prior to surgery  LAST DOSE Tuesday EVENING  ____ Take 1/2 of usual insulin dose the night before surgery. No insulin the morning          of surgery.   ____ Stop Blood Thinners Coumadin/Plavix/Xarelto/Pleta/Pradaxa/Eliquis/Effient/Aspirin  on   Or contact your Surgeon, Cardiologist or Medical Doctor regarding  ability to stop your blood thinners  __X__ Stop Anti-inflammatories 7 days before surgery such as Advil, Ibuprofen, Motrin,  BC or Goodies Powder, Naprosyn, Naproxen, Aleve, Aspirin    __X__ Stopall herbal supplements, fish oil or vitamin E until after surgery.    ____ Bring C-Pap to the hospital.

## 2017-04-05 LAB — URINE CULTURE: CULTURE: NO GROWTH

## 2017-04-10 MED ORDER — CEFAZOLIN SODIUM-DEXTROSE 2-4 GM/100ML-% IV SOLN
2.0000 g | INTRAVENOUS | Status: AC
  Administered 2017-04-11: 2 g via INTRAVENOUS

## 2017-04-11 ENCOUNTER — Ambulatory Visit
Admission: RE | Admit: 2017-04-11 | Discharge: 2017-04-11 | Disposition: A | Discharge status: Home / Self Care | Payer: 59 | Source: Ambulatory Visit | Attending: Urology | Admitting: Urology

## 2017-04-11 ENCOUNTER — Ambulatory Visit: Payer: 59 | Admitting: Anesthesiology

## 2017-04-11 ENCOUNTER — Encounter
Admission: RE | Disposition: A | Discharge status: Home / Self Care | Payer: Self-pay | Source: Ambulatory Visit | Attending: Urology

## 2017-04-11 DIAGNOSIS — N200 Calculus of kidney: Secondary | ICD-10-CM | POA: Diagnosis not present

## 2017-04-11 DIAGNOSIS — I10 Essential (primary) hypertension: Secondary | ICD-10-CM | POA: Insufficient documentation

## 2017-04-11 DIAGNOSIS — Z7984 Long term (current) use of oral hypoglycemic drugs: Secondary | ICD-10-CM | POA: Insufficient documentation

## 2017-04-11 DIAGNOSIS — N529 Male erectile dysfunction, unspecified: Secondary | ICD-10-CM | POA: Diagnosis not present

## 2017-04-11 DIAGNOSIS — E119 Type 2 diabetes mellitus without complications: Secondary | ICD-10-CM | POA: Diagnosis not present

## 2017-04-11 DIAGNOSIS — Z8249 Family history of ischemic heart disease and other diseases of the circulatory system: Secondary | ICD-10-CM | POA: Insufficient documentation

## 2017-04-11 DIAGNOSIS — E785 Hyperlipidemia, unspecified: Secondary | ICD-10-CM | POA: Insufficient documentation

## 2017-04-11 DIAGNOSIS — Z79899 Other long term (current) drug therapy: Secondary | ICD-10-CM | POA: Insufficient documentation

## 2017-04-11 DIAGNOSIS — F419 Anxiety disorder, unspecified: Secondary | ICD-10-CM | POA: Diagnosis not present

## 2017-04-11 DIAGNOSIS — N201 Calculus of ureter: Secondary | ICD-10-CM | POA: Diagnosis not present

## 2017-04-11 DIAGNOSIS — N132 Hydronephrosis with renal and ureteral calculous obstruction: Secondary | ICD-10-CM | POA: Diagnosis not present

## 2017-04-11 HISTORY — PX: CYSTOSCOPY/URETEROSCOPY/HOLMIUM LASER/STENT PLACEMENT: SHX6546

## 2017-04-11 HISTORY — DX: Failed or difficult intubation, initial encounter: T88.4XXA

## 2017-04-11 LAB — GLUCOSE, CAPILLARY
GLUCOSE-CAPILLARY: 159 mg/dL — AB (ref 65–99)
Glucose-Capillary: 158 mg/dL — ABNORMAL HIGH (ref 65–99)

## 2017-04-11 SURGERY — CYSTOSCOPY/URETEROSCOPY/HOLMIUM LASER/STENT PLACEMENT
Anesthesia: General | Site: Ureter | Laterality: Right | Wound class: Clean Contaminated

## 2017-04-11 MED ORDER — PROPOFOL 10 MG/ML IV BOLUS
INTRAVENOUS | Status: AC
  Filled 2017-04-11: qty 20

## 2017-04-11 MED ORDER — MIDAZOLAM HCL 2 MG/2ML IJ SOLN
INTRAMUSCULAR | Status: DC | PRN
  Administered 2017-04-11: 2 mg via INTRAVENOUS

## 2017-04-11 MED ORDER — OXYCODONE HCL 5 MG PO TABS: 5.0000 mg | ORAL_TABLET | Freq: Once | ORAL | Status: DC | PRN

## 2017-04-11 MED ORDER — HYDROCODONE-ACETAMINOPHEN 5-325 MG PO TABS: 1.0000 | ORAL_TABLET | ORAL | 0 refills | Status: DC | PRN

## 2017-04-11 MED ORDER — FAMOTIDINE 20 MG PO TABS
20.0000 mg | ORAL_TABLET | Freq: Once | ORAL | Status: AC
  Administered 2017-04-11: 20 mg via ORAL

## 2017-04-11 MED ORDER — SODIUM CHLORIDE 0.9 % IV SOLN
INTRAVENOUS | Status: DC
  Administered 2017-04-11 (×2): via INTRAVENOUS

## 2017-04-11 MED ORDER — DEXMEDETOMIDINE HCL 200 MCG/2ML IV SOLN
INTRAVENOUS | Status: DC | PRN
  Administered 2017-04-11: 12 ug via INTRAVENOUS

## 2017-04-11 MED ORDER — FENTANYL CITRATE (PF) 100 MCG/2ML IJ SOLN
INTRAMUSCULAR | Status: AC
  Filled 2017-04-11: qty 2

## 2017-04-11 MED ORDER — FENTANYL CITRATE (PF) 100 MCG/2ML IJ SOLN
INTRAMUSCULAR | Status: DC | PRN
  Administered 2017-04-11: 100 ug via INTRAVENOUS
  Administered 2017-04-11: 50 ug via INTRAVENOUS
  Administered 2017-04-11: 100 ug via INTRAVENOUS
  Administered 2017-04-11 (×2): 50 ug via INTRAVENOUS

## 2017-04-11 MED ORDER — PHENYLEPHRINE HCL 10 MG/ML IJ SOLN
INTRAMUSCULAR | Status: DC | PRN
  Administered 2017-04-11: 40 ug via INTRAVENOUS
  Administered 2017-04-11: 80 ug via INTRAVENOUS

## 2017-04-11 MED ORDER — DEXAMETHASONE SODIUM PHOSPHATE 10 MG/ML IJ SOLN
INTRAMUSCULAR | Status: DC | PRN
  Administered 2017-04-11: 10 mg via INTRAVENOUS

## 2017-04-11 MED ORDER — OXYBUTYNIN CHLORIDE 5 MG PO TABS: ORAL_TABLET | ORAL | 0 refills | Status: DC

## 2017-04-11 MED ORDER — DEXAMETHASONE SODIUM PHOSPHATE 10 MG/ML IJ SOLN
INTRAMUSCULAR | Status: AC
  Filled 2017-04-11: qty 1

## 2017-04-11 MED ORDER — MIDAZOLAM HCL 2 MG/2ML IJ SOLN
INTRAMUSCULAR | Status: AC
  Filled 2017-04-11: qty 2

## 2017-04-11 MED ORDER — SUGAMMADEX SODIUM 200 MG/2ML IV SOLN
INTRAVENOUS | Status: DC | PRN
  Administered 2017-04-11: 180.6 mg via INTRAVENOUS

## 2017-04-11 MED ORDER — ROCURONIUM BROMIDE 50 MG/5ML IV SOLN
INTRAVENOUS | Status: AC
  Filled 2017-04-11: qty 1

## 2017-04-11 MED ORDER — FENTANYL CITRATE (PF) 100 MCG/2ML IJ SOLN: 25.0000 ug | INTRAMUSCULAR | Status: DC | PRN

## 2017-04-11 MED ORDER — OXYCODONE HCL 5 MG/5ML PO SOLN: 5.0000 mg | Freq: Once | ORAL | Status: DC | PRN

## 2017-04-11 MED ORDER — ROCURONIUM BROMIDE 100 MG/10ML IV SOLN
INTRAVENOUS | Status: DC | PRN
  Administered 2017-04-11: 40 mg via INTRAVENOUS
  Administered 2017-04-11 (×2): 10 mg via INTRAVENOUS

## 2017-04-11 MED ORDER — SUCCINYLCHOLINE CHLORIDE 20 MG/ML IJ SOLN
INTRAMUSCULAR | Status: AC
  Filled 2017-04-11: qty 1

## 2017-04-11 MED ORDER — LIDOCAINE HCL (PF) 2 % IJ SOLN
INTRAMUSCULAR | Status: AC
  Filled 2017-04-11: qty 10

## 2017-04-11 MED ORDER — PROPOFOL 10 MG/ML IV BOLUS
INTRAVENOUS | Status: DC | PRN
  Administered 2017-04-11: 30 mg via INTRAVENOUS
  Administered 2017-04-11: 200 mg via INTRAVENOUS
  Administered 2017-04-11: 50 mg via INTRAVENOUS

## 2017-04-11 MED ORDER — FAMOTIDINE 20 MG PO TABS
ORAL_TABLET | ORAL | Status: AC
  Administered 2017-04-11: 20 mg via ORAL
  Filled 2017-04-11: qty 1

## 2017-04-11 MED ORDER — LIDOCAINE 2% (20 MG/ML) 5 ML SYRINGE
INTRAMUSCULAR | Status: DC | PRN
  Administered 2017-04-11: 100 mg via INTRAVENOUS

## 2017-04-11 MED ORDER — SUGAMMADEX SODIUM 200 MG/2ML IV SOLN
INTRAVENOUS | Status: AC
  Filled 2017-04-11: qty 2

## 2017-04-11 MED ORDER — ONDANSETRON HCL 4 MG/2ML IJ SOLN
INTRAMUSCULAR | Status: AC
  Filled 2017-04-11: qty 2

## 2017-04-11 MED ORDER — CEFAZOLIN SODIUM-DEXTROSE 2-4 GM/100ML-% IV SOLN
INTRAVENOUS | Status: AC
  Filled 2017-04-11: qty 100

## 2017-04-11 MED ORDER — TAMSULOSIN HCL 0.4 MG PO CAPS: 0.4000 mg | ORAL_CAPSULE | Freq: Every day | ORAL | 0 refills | Status: DC

## 2017-04-11 MED ORDER — ONDANSETRON HCL 4 MG/2ML IJ SOLN
INTRAMUSCULAR | Status: DC | PRN
  Administered 2017-04-11: 4 mg via INTRAVENOUS

## 2017-04-11 MED ORDER — FENTANYL CITRATE (PF) 250 MCG/5ML IJ SOLN
INTRAMUSCULAR | Status: AC
  Filled 2017-04-11: qty 5

## 2017-04-11 MED ORDER — IOTHALAMATE MEGLUMINE 43 % IV SOLN
INTRAVENOUS | Status: DC | PRN
  Administered 2017-04-11: 40 mL via URETHRAL

## 2017-04-11 SURGICAL SUPPLY — 30 items
BAG DRAIN CYSTO-URO LG1000N (MISCELLANEOUS) ×2 IMPLANT
BASKET ZERO TIP 1.9FR (BASKET) IMPLANT
BRUSH SCRUB EZ 1% IODOPHOR (MISCELLANEOUS) ×2 IMPLANT
CATH URETL 5X70 OPEN END (CATHETERS) ×2 IMPLANT
CNTNR SPEC 2.5X3XGRAD LEK (MISCELLANEOUS)
CONRAY 43 FOR UROLOGY 50M (MISCELLANEOUS) ×2 IMPLANT
CONT SPEC 4OZ STER OR WHT (MISCELLANEOUS)
CONTAINER SPEC 2.5X3XGRAD LEK (MISCELLANEOUS) IMPLANT
DRAPE UTILITY 15X26 TOWEL STRL (DRAPES) ×2 IMPLANT
FIBER LASER LITHO 273 (Laser) ×2 IMPLANT
GLIDEWIRE STR 0.035 150CM 3CM (WIRE) ×4 IMPLANT
GLOVE BIO SURGEON STRL SZ8 (GLOVE) ×2 IMPLANT
GOWN STRL REUS W/ TWL LRG LVL3 (GOWN DISPOSABLE) ×2 IMPLANT
GOWN STRL REUS W/TWL LRG LVL3 (GOWN DISPOSABLE) ×2
GUIDEWIRE GREEN .038 145CM (MISCELLANEOUS) IMPLANT
INFUSOR MANOMETER BAG 3000ML (MISCELLANEOUS) ×2 IMPLANT
INTRODUCER DILATOR DOUBLE (INTRODUCER) ×2 IMPLANT
KIT TURNOVER CYSTO (KITS) ×2 IMPLANT
PACK CYSTO AR (MISCELLANEOUS) ×2 IMPLANT
SENSORWIRE 0.038 NOT ANGLED (WIRE) ×4
SET CYSTO W/LG BORE CLAMP LF (SET/KITS/TRAYS/PACK) ×2 IMPLANT
SHEATH URETERAL 12FR 45CM (SHEATH) ×2 IMPLANT
SHEATH URETERAL 12FRX35CM (MISCELLANEOUS) IMPLANT
SOL .9 NS 3000ML IRR  AL (IV SOLUTION) ×1
SOL .9 NS 3000ML IRR UROMATIC (IV SOLUTION) ×1 IMPLANT
STENT URET 6FRX24 CONTOUR (STENTS) ×2 IMPLANT
STENT URET 6FRX26 CONTOUR (STENTS) IMPLANT
SURGILUBE 2OZ TUBE FLIPTOP (MISCELLANEOUS) ×2 IMPLANT
WATER STERILE IRR 1000ML POUR (IV SOLUTION) ×2 IMPLANT
WIRE SENSOR 0.038 NOT ANGLED (WIRE) ×2 IMPLANT

## 2017-04-11 NOTE — Discharge Instructions (Signed)

## 2017-04-11 NOTE — Transfer of Care (Signed)
Immediate Anesthesia Transfer of Care Note  Patient: Andrew Stevens  Procedure(s) Performed: CYSTOSCOPY/URETEROSCOPY/HOLMIUM LASER/STENT PLACEMENT (Right Ureter)  Patient Location: PACU  Anesthesia Type:General  Level of Consciousness: awake, alert  and oriented  Airway & Oxygen Therapy: Patient Spontanous Breathing and Patient connected to face mask oxygen  Post-op Assessment: Report given to RN and Post -op Vital signs reviewed and stable  Post vital signs: Reviewed and stable  Last Vitals:  Vitals:   04/11/17 0609  BP: (!) 164/100  Pulse: 91  Resp: 16  Temp: (!) 36.4 C  SpO2: 100%    Last Pain:  Vitals:   04/11/17 0609  TempSrc: Temporal         Complications: No apparent anesthesia complications

## 2017-04-11 NOTE — Anesthesia Preprocedure Evaluation (Signed)
Anesthesia Evaluation  Patient identified by MRN, date of birth, ID band Patient awake    Reviewed: Allergy & Precautions, H&P , NPO status , Patient's Chart, lab work & pertinent test results  History of Anesthesia Complications Negative for: history of anesthetic complications  Airway Mallampati: III  TM Distance: <3 FB Neck ROM: full    Dental  (+) Chipped   Pulmonary neg pulmonary ROS, neg shortness of breath,           Cardiovascular Exercise Tolerance: Good hypertension, (-) angina(-) Past MI and (-) DOE      Neuro/Psych PSYCHIATRIC DISORDERS Anxiety negative neurological ROS     GI/Hepatic negative GI ROS, Neg liver ROS, neg GERD  ,  Endo/Other  diabetes, Type 2  Renal/GU Renal disease     Musculoskeletal   Abdominal   Peds  Hematology negative hematology ROS (+)   Anesthesia Other Findings Past Medical History: No date: Anxiety No date: Diabetes mellitus without complication (HCC) No date: Erectile dysfunction No date: History of kidney stones No date: Hyperlipidemia No date: Hypertension No date: Kidney stones No date: Lipoma of extremity  Past Surgical History: 1989: BRAIN SURGERY     Comment:  Cyst removed - benign No date: INGUINAL HERNIA REPAIR 2013: ROTATOR CUFF REPAIR; Right     Comment:  Dr. Rudene Christians  2014: ROTATOR CUFF REPAIR; Right     Comment:  Dr. Mack Guise No date: [place]     Reproductive/Obstetrics negative OB ROS                             Anesthesia Physical Anesthesia Plan  ASA: III  Anesthesia Plan: General ETT   Post-op Pain Management:    Induction: Intravenous  PONV Risk Score and Plan: Ondansetron, Dexamethasone and Midazolam  Airway Management Planned: Oral ETT  Additional Equipment:   Intra-op Plan:   Post-operative Plan: Extubation in OR  Informed Consent: I have reviewed the patients History and Physical, chart,  labs and discussed the procedure including the risks, benefits and alternatives for the proposed anesthesia with the patient or authorized representative who has indicated his/her understanding and acceptance.   Dental Advisory Given  Plan Discussed with: Anesthesiologist, CRNA and Surgeon  Anesthesia Plan Comments: (Patient consented for risks of anesthesia including but not limited to:  - adverse reactions to medications - damage to teeth, lips or other oral mucosa - sore throat or hoarseness - Damage to heart, brain, lungs or loss of life  Patient voiced understanding.)        Anesthesia Quick Evaluation

## 2017-04-11 NOTE — Progress Notes (Signed)
Dr. Amie Critchley called about elevated BP. Stated elevated prior to and pt ok to go home. Sharon Stapel E 11:23 AM 04/11/2017

## 2017-04-11 NOTE — Interval H&P Note (Signed)
History and Physical Interval Note:  04/11/2017 7:14 AM  Andrew Stevens  has presented today for surgery, with the diagnosis of right nephrolithiasis  The various methods of treatment have been discussed with the patient and family. After consideration of risks, benefits and other options for treatment, the patient has consented to  Procedure(s): CYSTOSCOPY/URETEROSCOPY/HOLMIUM LASER/STENT PLACEMENT (Right) as a surgical intervention .  The patient's history has been reviewed, patient examined, no change in status, stable for surgery.  I have reviewed the patient's chart and labs.  Questions were answered to the patient's satisfaction.     [place]

## 2017-04-11 NOTE — Anesthesia Postprocedure Evaluation (Signed)
Anesthesia Post Note  Patient: GAYNOR FERRERAS  Procedure(s) Performed: CYSTOSCOPY/URETEROSCOPY/HOLMIUM LASER/STENT PLACEMENT (Right Ureter)  Patient location during evaluation: PACU Anesthesia Type: General Level of consciousness: awake and alert Pain management: pain level controlled Vital Signs Assessment: post-procedure vital signs reviewed and stable Respiratory status: spontaneous breathing, nonlabored ventilation, respiratory function stable and patient connected to nasal cannula oxygen Cardiovascular status: blood pressure returned to baseline and stable Postop Assessment: no apparent nausea or vomiting Anesthetic complications: no     Last Vitals:  Vitals:   04/11/17 1130 04/11/17 1214  BP: (!) 144/86 (!) 158/91  Pulse: 70 65  Resp: 16 16  Temp: 36.7 C   SpO2: 99%     Last Pain:  Vitals:   04/11/17 1130  TempSrc: Oral  PainSc: 1                  Precious Haws [place] Mcglynn

## 2017-04-11 NOTE — Op Note (Signed)
Preoperative diagnosis: Right proximal ureteral calculus  Postoperative diagnosis: Right proximal ureteral calculus  Procedure:  1. Cystoscopy 2. Right ureteroscopy 3. Ureteroscopic laser lithotripsy 4. Right ureteral stent placement  5. Right retrograde pyelography with interpretation  Surgeon: Nicki Reaper C. Stoioff, M.D.  Anesthesia: General  Complications: None  Intraoperative findings:  1.  Right retrograde pyelography demonstrated large calcific density within the right proximal ureter consistent with the patient's known calculus.  No contrast was seen proximal to the stone or in the collecting system due to impaction  2.  Right retrograde pyelography post procedure showed severe right hydronephrosis and no evidence of contrast extravasation  EBL: Minimal  Specimens: 1. None   Indication: Andrew Stevens is a 57 y.o. year old patient with bilateral nephrolithiasis.  He has a 10 x 19 mm right proximal ureteral calculus with severe hydronephrosis in addition to right renal calculi.  He declined PCNL and desired staged ureteroscopy.  After reviewing the management options for treatment, the patient elected to proceed with the above surgical procedure(s). We have discussed the potential benefits and risks of the procedure, side effects of the proposed treatment, the likelihood of the patient achieving the goals of the procedure, and any potential problems that might occur during the procedure or recuperation. Informed consent has been obtained.  Description of procedure:  The patient was taken to the operating room and general anesthesia was induced.  The patient was placed in the dorsal lithotomy position, prepped and draped in the usual sterile fashion, and preoperative antibiotics were administered. A preoperative time-out was performed.   A 22 French cystoscope was lubricated and passed under direct vision.  The urethra was normal in caliber without stricture.  The prostate  demonstrated mild lateral lobe enlargement and mild bladder neck elevation.  Panendoscopy was performed and the bladder mucosa showed no erythema, solid or papillary lesions.  Attention was directed to the right ureteral orifice and a 0.038 Sensor wire was then advanced up the right ureter under fluoroscopic guidance.  The wire was unable to be negotiated passed the stone.  A 5 French open ended ureteral catheter was placed over the guidewire and the wire was unable to be advanced.  Placement of a 1.610 hydrophilic guidewire were also unsuccessful.  The open-ended ureteral catheter was removed.  A 6 Fr semirigid ureteroscope was then advanced into the ureter next to the guidewire and the calculus was identified in the proximal ureter.  Multiple attempts at passing a wire through the ureteroscope beyond the stone were also unsuccessful.  Right retrograde pyelogram was performed through the ureteroscope with findings as described above.  There was excellent vision of the stone and it was elected to begin fragmentation in hopes of eventually advancing a wire into the renal pelvis.  Fragmentation was then commenced with a 273 micron holmium laser fiber on a setting of 0.2 J and frequency of 40 hz.  The ureteroscope was withdrawn slightly and was unable to be advanced back to the level of the stone.  A hydrophilic guidewire was able to be advanced beyond the stone and the semirigid ureteroscope was removed.  A ureteral access sheath was advanced over the sensor wire without difficulty.  A flexible ureteroscope was then placed through the sheath however could also not be advanced to the level of the stone.  The ureteroscope and access sheath were removed.  The semirigid ureteroscope was repassed and the wire was able to be followed back to the level of stone.  Fragmentation was  again started and enough of the stone was fragmented to see beyond the ureter.  The ureteroscope was able to be advanced into the renal  pelvis.  Additional fragmentation was performed until approximately 75% of the stone was dusted.  He also has greater than 10 mm renal calculi and it was elected at this point to place a ureteral stent.  Retrograde pyelogram was performed with findings as described above.  The sensor wire was removed and readvanced through the ureteroscope into the renal pelvis.  The ureteroscope was removed.  The backloaded on the cystoscope and a 6 French/24 cm double-J ureteral stent without tether was placed without difficulty.  The bladder was then emptied and the cystoscope was removed. The patient appeared to tolerate the procedure well and without complications.  After anesthetic reversal the patient was transported to the PACU in stable condition.     [name], MD.

## 2017-04-11 NOTE — Anesthesia Procedure Notes (Signed)
Procedure Name: Intubation Date/Time: 04/11/2017 7:47 AM Performed by: Marsh Dolly, CRNA Pre-anesthesia Checklist: Patient identified, Patient being monitored, Timeout performed, Emergency Drugs available and Suction available Patient Re-evaluated:Patient Re-evaluated prior to induction Oxygen Delivery Method: Circle system utilized Preoxygenation: Pre-oxygenation with 100% oxygen Induction Type: IV induction Ventilation: Mask ventilation without difficulty Laryngoscope Size: McGraph and 4 Grade View: Grade I Tube type: Oral Tube size: 7.5 mm Number of attempts: 1 Airway Equipment and Method: Stylet Placement Confirmation: ETT inserted through vocal cords under direct vision,  positive ETCO2 and breath sounds checked- equal and bilateral Secured at: 21 cm Tube secured with: Tape Dental Injury: Teeth and Oropharynx as per pre-operative assessment  Difficulty Due To: Difficulty was anticipated

## 2017-04-11 NOTE — Anesthesia Post-op Follow-up Note (Signed)
Anesthesia QCDR form completed.        

## 2017-04-17 ENCOUNTER — Encounter (HOSPITAL_BASED_OUTPATIENT_CLINIC_OR_DEPARTMENT_OTHER): Payer: Self-pay | Admitting: *Deleted

## 2017-04-21 ENCOUNTER — Ambulatory Visit: Payer: Self-pay | Admitting: Surgery

## 2017-04-22 ENCOUNTER — Encounter (HOSPITAL_BASED_OUTPATIENT_CLINIC_OR_DEPARTMENT_OTHER): Payer: Self-pay | Admitting: *Deleted

## 2017-04-22 ENCOUNTER — Ambulatory Visit (HOSPITAL_BASED_OUTPATIENT_CLINIC_OR_DEPARTMENT_OTHER)
Admission: RE | Admit: 2017-04-22 | Discharge: 2017-04-22 | Disposition: A | Discharge status: Home / Self Care | Payer: 59 | Source: Ambulatory Visit | Attending: Surgery | Admitting: Surgery

## 2017-04-22 ENCOUNTER — Ambulatory Visit (HOSPITAL_BASED_OUTPATIENT_CLINIC_OR_DEPARTMENT_OTHER): Payer: 59 | Admitting: Anesthesiology

## 2017-04-22 ENCOUNTER — Encounter (HOSPITAL_BASED_OUTPATIENT_CLINIC_OR_DEPARTMENT_OTHER)
Admission: RE | Disposition: A | Discharge status: Home / Self Care | Payer: Self-pay | Source: Ambulatory Visit | Attending: Surgery

## 2017-04-22 DIAGNOSIS — L720 Epidermal cyst: Secondary | ICD-10-CM | POA: Insufficient documentation

## 2017-04-22 DIAGNOSIS — I10 Essential (primary) hypertension: Secondary | ICD-10-CM | POA: Insufficient documentation

## 2017-04-22 DIAGNOSIS — Z7984 Long term (current) use of oral hypoglycemic drugs: Secondary | ICD-10-CM | POA: Insufficient documentation

## 2017-04-22 DIAGNOSIS — Z79899 Other long term (current) drug therapy: Secondary | ICD-10-CM | POA: Insufficient documentation

## 2017-04-22 DIAGNOSIS — E785 Hyperlipidemia, unspecified: Secondary | ICD-10-CM | POA: Diagnosis not present

## 2017-04-22 DIAGNOSIS — E119 Type 2 diabetes mellitus without complications: Secondary | ICD-10-CM | POA: Insufficient documentation

## 2017-04-22 DIAGNOSIS — L723 Sebaceous cyst: Secondary | ICD-10-CM | POA: Diagnosis not present

## 2017-04-22 DIAGNOSIS — K6289 Other specified diseases of anus and rectum: Secondary | ICD-10-CM | POA: Diagnosis not present

## 2017-04-22 DIAGNOSIS — R229 Localized swelling, mass and lump, unspecified: Secondary | ICD-10-CM | POA: Diagnosis present

## 2017-04-22 HISTORY — PX: MASS EXCISION: SHX2000

## 2017-04-22 LAB — POCT I-STAT, CHEM 8
BUN: 23 mg/dL — AB (ref 6–20)
CREATININE: 1.1 mg/dL (ref 0.61–1.24)
Calcium, Ion: 1.12 mmol/L — ABNORMAL LOW (ref 1.15–1.40)
Chloride: 101 mmol/L (ref 101–111)
GLUCOSE: 131 mg/dL — AB (ref 65–99)
HEMATOCRIT: 42 % (ref 39.0–52.0)
Hemoglobin: 14.3 g/dL (ref 13.0–17.0)
Potassium: 3.6 mmol/L (ref 3.5–5.1)
Sodium: 140 mmol/L (ref 135–145)
TCO2: 29 mmol/L (ref 22–32)

## 2017-04-22 LAB — GLUCOSE, CAPILLARY: Glucose-Capillary: 106 mg/dL — ABNORMAL HIGH (ref 65–99)

## 2017-04-22 SURGERY — EXCISION MASS
Anesthesia: General | Site: Perineum

## 2017-04-22 MED ORDER — BUPIVACAINE-EPINEPHRINE (PF) 0.5% -1:200000 IJ SOLN
INTRAMUSCULAR | Status: AC
  Filled 2017-04-22: qty 30

## 2017-04-22 MED ORDER — CHLORHEXIDINE GLUCONATE CLOTH 2 % EX PADS: 6.0000 | MEDICATED_PAD | Freq: Once | CUTANEOUS | Status: DC

## 2017-04-22 MED ORDER — FENTANYL CITRATE (PF) 100 MCG/2ML IJ SOLN
INTRAMUSCULAR | Status: AC
  Filled 2017-04-22: qty 2

## 2017-04-22 MED ORDER — HEPARIN SODIUM (PORCINE) 5000 UNIT/ML IJ SOLN
INTRAMUSCULAR | Status: AC
  Filled 2017-04-22: qty 1

## 2017-04-22 MED ORDER — MIDAZOLAM HCL 2 MG/2ML IJ SOLN
1.0000 mg | INTRAMUSCULAR | Status: DC | PRN
  Administered 2017-04-22: 2 mg via INTRAVENOUS

## 2017-04-22 MED ORDER — FENTANYL CITRATE (PF) 100 MCG/2ML IJ SOLN: 25.0000 ug | INTRAMUSCULAR | Status: DC | PRN

## 2017-04-22 MED ORDER — PROMETHAZINE HCL 25 MG/ML IJ SOLN: 6.2500 mg | INTRAMUSCULAR | Status: DC | PRN

## 2017-04-22 MED ORDER — PROPOFOL 10 MG/ML IV BOLUS
INTRAVENOUS | Status: AC
  Filled 2017-04-22: qty 20

## 2017-04-22 MED ORDER — CEFAZOLIN SODIUM-DEXTROSE 2-4 GM/100ML-% IV SOLN
2.0000 g | INTRAVENOUS | Status: AC
  Administered 2017-04-22: 2 g via INTRAVENOUS

## 2017-04-22 MED ORDER — MIDAZOLAM HCL 2 MG/2ML IJ SOLN
INTRAMUSCULAR | Status: AC
  Filled 2017-04-22: qty 2

## 2017-04-22 MED ORDER — CEFAZOLIN SODIUM-DEXTROSE 2-4 GM/100ML-% IV SOLN
INTRAVENOUS | Status: AC
  Filled 2017-04-22: qty 100

## 2017-04-22 MED ORDER — ONDANSETRON HCL 4 MG/2ML IJ SOLN
INTRAMUSCULAR | Status: DC | PRN
  Administered 2017-04-22: 4 mg via INTRAVENOUS

## 2017-04-22 MED ORDER — DEXAMETHASONE SODIUM PHOSPHATE 4 MG/ML IJ SOLN
INTRAMUSCULAR | Status: DC | PRN
  Administered 2017-04-22: 4 mg via INTRAVENOUS

## 2017-04-22 MED ORDER — LIDOCAINE HCL (CARDIAC) 20 MG/ML IV SOLN
INTRAVENOUS | Status: DC | PRN
  Administered 2017-04-22: 80 mg via INTRAVENOUS

## 2017-04-22 MED ORDER — HEPARIN SODIUM (PORCINE) 5000 UNIT/ML IJ SOLN: 5000.0000 [IU] | Freq: Once | INTRAMUSCULAR | Status: DC

## 2017-04-22 MED ORDER — FENTANYL CITRATE (PF) 100 MCG/2ML IJ SOLN
50.0000 ug | INTRAMUSCULAR | Status: AC | PRN
  Administered 2017-04-22 (×2): 25 ug via INTRAVENOUS
  Administered 2017-04-22: 100 ug via INTRAVENOUS

## 2017-04-22 MED ORDER — HYDROCODONE-ACETAMINOPHEN 5-325 MG PO TABS: 1.0000 | ORAL_TABLET | Freq: Four times a day (QID) | ORAL | 0 refills | Status: DC | PRN

## 2017-04-22 MED ORDER — SCOPOLAMINE 1 MG/3DAYS TD PT72: 1.0000 | MEDICATED_PATCH | Freq: Once | TRANSDERMAL | Status: DC | PRN

## 2017-04-22 MED ORDER — PROPOFOL 10 MG/ML IV BOLUS
INTRAVENOUS | Status: DC | PRN
  Administered 2017-04-22: 200 mg via INTRAVENOUS

## 2017-04-22 MED ORDER — LACTATED RINGERS IV SOLN
INTRAVENOUS | Status: DC
  Administered 2017-04-22: 11:00:00 via INTRAVENOUS

## 2017-04-22 SURGICAL SUPPLY — 41 items
BLADE SURG 15 STRL LF DISP TIS (BLADE) ×2 IMPLANT
BLADE SURG 15 STRL SS (BLADE) ×2
BRIEF STRETCH FOR OB PAD XXL (UNDERPADS AND DIAPERS) ×4 IMPLANT
CANISTER SUCT 1200ML W/VALVE (MISCELLANEOUS) ×4 IMPLANT
DECANTER SPIKE VIAL GLASS SM (MISCELLANEOUS) IMPLANT
DERMABOND ADVANCED (GAUZE/BANDAGES/DRESSINGS) ×2
DERMABOND ADVANCED .7 DNX12 (GAUZE/BANDAGES/DRESSINGS) ×2 IMPLANT
DRSG PAD ABDOMINAL 8X10 ST (GAUZE/BANDAGES/DRESSINGS) IMPLANT
ELECT REM PT RETURN 9FT ADLT (ELECTROSURGICAL) ×4
ELECTRODE REM PT RTRN 9FT ADLT (ELECTROSURGICAL) ×2 IMPLANT
GAUZE PETROLATUM 1 X8 (GAUZE/BANDAGES/DRESSINGS) IMPLANT
GAUZE SPONGE 4X4 12PLY STRL LF (GAUZE/BANDAGES/DRESSINGS) ×8 IMPLANT
GLOVE BIO SURGEON STRL SZ8 (GLOVE) ×4 IMPLANT
GLOVE BIOGEL PI IND STRL 7.0 (GLOVE) ×2 IMPLANT
GLOVE BIOGEL PI IND STRL 7.5 (GLOVE) ×2 IMPLANT
GLOVE BIOGEL PI INDICATOR 7.0 (GLOVE) ×2
GLOVE BIOGEL PI INDICATOR 7.5 (GLOVE) ×2
GLOVE NEODERM STRL 7.5 LF PF (GLOVE) ×2 IMPLANT
GLOVE SURG NEODERM 7.5  LF PF (GLOVE) ×2
GLOVE SURG SS PI 7.0 STRL IVOR (GLOVE) ×4 IMPLANT
GOWN STRL REUS W/ TWL LRG LVL3 (GOWN DISPOSABLE) ×2 IMPLANT
GOWN STRL REUS W/ TWL XL LVL3 (GOWN DISPOSABLE) ×4 IMPLANT
GOWN STRL REUS W/TWL LRG LVL3 (GOWN DISPOSABLE) ×2
GOWN STRL REUS W/TWL XL LVL3 (GOWN DISPOSABLE) ×4
NEEDLE HYPO 25X1 1.5 SAFETY (NEEDLE) ×4 IMPLANT
PACK BASIN DAY SURGERY FS (CUSTOM PROCEDURE TRAY) ×4 IMPLANT
PACK LITHOTOMY IV (CUSTOM PROCEDURE TRAY) ×4 IMPLANT
PENCIL BUTTON HOLSTER BLD 10FT (ELECTRODE) ×4 IMPLANT
SURGILUBE 2OZ TUBE FLIPTOP (MISCELLANEOUS) IMPLANT
SUT CHROMIC 2 0 SH (SUTURE) IMPLANT
SUT CHROMIC 3 0 SH 27 (SUTURE) IMPLANT
SUT MNCRL AB 4-0 PS2 18 (SUTURE) ×4 IMPLANT
SUT VIC AB 4-0 SH 18 (SUTURE) ×4 IMPLANT
SYR CONTROL 10ML LL (SYRINGE) ×4 IMPLANT
TOWEL OR 17X24 6PK STRL BLUE (TOWEL DISPOSABLE) ×8 IMPLANT
TRAY DSU PREP LF (CUSTOM PROCEDURE TRAY) ×4 IMPLANT
TRAY PROCTOSCOPIC FIBER OPTIC (SET/KITS/TRAYS/PACK) IMPLANT
TUBE CONNECTING 20'X1/4 (TUBING) ×1
TUBE CONNECTING 20X1/4 (TUBING) ×3 IMPLANT
UNDERPAD 30X30 (UNDERPADS AND DIAPERS) ×4 IMPLANT
YANKAUER SUCT BULB TIP NO VENT (SUCTIONS) ×4 IMPLANT

## 2017-04-22 NOTE — Op Note (Signed)
Andrew Stevens  Oct 15, 1960 April 22, 2016   PCP:  McLean-Scocuzza, Nino Glow, MD   Surgeon: Kaylyn Lim, MD, FACS  Asst:  none  Anes:  general  Preop Dx: Three masses of the perineum:  Left inner thigh (3 cm), right perirectal (3 cm) ; and scrotum (1 cm) Postop Dx: Probable large sebaceous cyst of the perineum and scrotum  Procedure: Excision and primary closure of three perineal masses Location Surgery: CDS #2 Complications: none  EBL:   minimal cc  Drains: none  Description of Procedure:  The patient was taken to OR 2 .  After anesthesia was administered and the patient was prepped a timeout was performed.  The patient was in the dorsal lithotomy and his perineum was prepped with [employer] and draped.  The three areas had been marked preop.  The inner thigh was first excised by excising an ellipse of skin and then excising the large mass entoto without entering it.  The resultant wound was cauterized and closed with 4-0 vicryl and a 4-0 monocryl.  The right perirectal lesion was excised similarly with an ellipse of skin and then removing the mass without entering it.  The wound was closed with 4-0 vicryl.  The scrotal lesion was excised by incising the lump and dissecting it from the surrounding tissue.  It was closed with 4-0 vicryl.  All wounds were cleaned and Dermabond applied.    The patient tolerated the procedure well and was taken to the PACU in stable condition.     Matt B. Hassell Done, [place], [facility], [place]

## 2017-04-22 NOTE — Discharge Instructions (Signed)
May shower tomorrow;  Use hair dryer to keep area clean and dry.   Post Anesthesia Home Care Instructions  Activity: Get plenty of rest for the remainder of the day. A responsible individual must stay with you for 24 hours following the procedure.  For the next 24 hours, DO NOT: -Drive a car -Paediatric nurse -Drink alcoholic beverages -Take any medication unless instructed by your physician -Make any legal decisions or sign important papers.  Meals: Start with liquid foods such as gelatin or soup. Progress to regular foods as tolerated. Avoid greasy, spicy, heavy foods. If nausea and/or vomiting occur, drink only clear liquids until the nausea and/or vomiting subsides. Call your physician if vomiting continues.  Special Instructions/Symptoms: Your throat may feel dry or sore from the anesthesia or the breathing tube placed in your throat during surgery. If this causes discomfort, gargle with warm salt water. The discomfort should disappear within 24 hours.  If you had a scopolamine patch placed behind your ear for the management of post- operative nausea and/or vomiting:  1. The medication in the patch is effective for 72 hours, after which it should be removed.  Wrap patch in a tissue and discard in the trash. Wash hands thoroughly with soap and water. 2. You may remove the patch earlier than 72 hours if you experience unpleasant side effects which may include dry mouth, dizziness or visual disturbances. 3. Avoid touching the patch. Wash your hands with soap and water after contact with the patch.

## 2017-04-22 NOTE — Anesthesia Procedure Notes (Signed)
Procedure Name: LMA Insertion Date/Time: 04/22/2017 1:32 PM Performed by: Maryella Shivers, CRNA Pre-anesthesia Checklist: Patient identified, Emergency Drugs available, Suction available and Patient being monitored Patient Re-evaluated:Patient Re-evaluated prior to induction Oxygen Delivery Method: Circle system utilized Preoxygenation: Pre-oxygenation with 100% oxygen Induction Type: IV induction Ventilation: Mask ventilation without difficulty LMA: LMA inserted LMA Size: 5.0 Number of attempts: 1 Airway Equipment and Method: Bite block Placement Confirmation: positive ETCO2 Tube secured with: Tape Dental Injury: Teeth and Oropharynx as per pre-operative assessment

## 2017-04-22 NOTE — Anesthesia Preprocedure Evaluation (Addendum)
Anesthesia Evaluation  Patient identified by MRN, date of birth, ID band Patient awake    Reviewed: Allergy & Precautions, NPO status , Patient's Chart, lab work & pertinent test results  History of Anesthesia Complications (+) DIFFICULT AIRWAY and history of anesthetic complications  Airway Mallampati: II  TM Distance: >3 FB Neck ROM: Full    Dental  (+) Teeth Intact, Dental Advisory Given, Chipped, Caps   Pulmonary neg pulmonary ROS,    Pulmonary exam normal breath sounds clear to auscultation       Cardiovascular hypertension, Pt. on medications Normal cardiovascular exam Rhythm:Regular Rate:Normal     Neuro/Psych PSYCHIATRIC DISORDERS Anxiety negative neurological ROS     GI/Hepatic negative GI ROS, Neg liver ROS,   Endo/Other  diabetes, Type 2, Oral Hypoglycemic Agents  Renal/GU negative Renal ROS     Musculoskeletal negative musculoskeletal ROS (+)   Abdominal   Peds  Hematology negative hematology ROS (+)   Anesthesia Other Findings Day of surgery medications reviewed with the patient.  Reproductive/Obstetrics                            Anesthesia Physical Anesthesia Plan  ASA: II  Anesthesia Plan: General   Post-op Pain Management:    Induction: Intravenous  PONV Risk Score and Plan: 2 and Dexamethasone, Ondansetron and Midazolam  Airway Management Planned: LMA  Additional Equipment:   Intra-op Plan:   Post-operative Plan: Extubation in OR  Informed Consent: I have reviewed the patients History and Physical, chart, labs and discussed the procedure including the risks, benefits and alternatives for the proposed anesthesia with the patient or authorized representative who has indicated his/her understanding and acceptance.   Dental advisory given  Plan Discussed with: CRNA  Anesthesia Plan Comments:         Anesthesia Quick Evaluation

## 2017-04-22 NOTE — Anesthesia Postprocedure Evaluation (Signed)
Anesthesia Post Note  Patient: MASIYAH ENGEN  Procedure(s) Performed: EXCISION OF 3  MASSES OF PERIRECTAL, INNER THIGH AREAS (Bilateral Perineum)     Patient location during evaluation: PACU Anesthesia Type: General Level of consciousness: awake and alert, oriented, patient cooperative and awake Pain management: pain level controlled Vital Signs Assessment: post-procedure vital signs reviewed and stable Respiratory status: spontaneous breathing, nonlabored ventilation and respiratory function stable Cardiovascular status: blood pressure returned to baseline and stable Postop Assessment: no apparent nausea or vomiting Anesthetic complications: no    Last Vitals:  Vitals:   04/22/17 1445 04/22/17 1500  BP: 112/74 132/86  Pulse: 70 76  Resp: 19 20  Temp:    SpO2: 100% 100%    Last Pain:  Vitals:   04/22/17 1500  TempSrc:   PainSc: 0-No pain                 Catalina Gravel

## 2017-04-22 NOTE — H&P (Signed)
Chief Complaint:  Multiple lipomata  History of Present Illness:  Andrew Stevens is an 57 y.o. male with three lipomas of the perineum.    Past Medical History:  Diagnosis Date  . Anxiety   . Diabetes mellitus without complication ([place])   . Difficult intubation   . Erectile dysfunction   . History of kidney stones   . Hyperlipidemia   . Hypertension   . Kidney stones   . Lipoma of extremity     Past Surgical History:  Procedure Laterality Date  . BRAIN SURGERY  1989   Cyst removed - benign  . CYSTOSCOPY/URETEROSCOPY/HOLMIUM LASER/STENT PLACEMENT Right 04/11/2017   Procedure: CYSTOSCOPY/URETEROSCOPY/HOLMIUM LASER/STENT PLACEMENT;  Surgeon: [name], MD;  Location: [facility] ORS;  Service: Urology;  Laterality: Right;  . INGUINAL HERNIA REPAIR    . ROTATOR CUFF REPAIR Right 2013   Dr. Rudene Christians   . ROTATOR CUFF REPAIR Right 2014   Dr. Mack Guise  . VENTRAL HERNIA REPAIR      Current Facility-Administered Medications  Medication Dose Route Frequency Provider Last Rate Last Dose  . ceFAZolin (ANCEF) IVPB 2g/100 mL premix  2 g Intravenous On Call to OR Johnathan Hausen, MD      . Chlorhexidine Gluconate Cloth 2 % PADS 6 each  6 each Topical Once Johnathan Hausen, MD       And  . Chlorhexidine Gluconate Cloth 2 % PADS 6 each  6 each Topical Once Johnathan Hausen, MD      . fentaNYL (SUBLIMAZE) injection 50-100 mcg  50-100 mcg Intravenous PRN Janeece Riggers, MD      . heparin injection 5,000 Units  5,000 Units Subcutaneous Once Johnathan Hausen, MD      . lactated ringers infusion   Intravenous Continuous Janeece Riggers, MD 10 mL/hr at 04/22/17 1102    . midazolam (VERSED) injection 1-2 mg  1-2 mg Intravenous PRN Janeece Riggers, MD      . scopolamine (TRANSDERM-SCOP) 1 MG/3DAYS 1.5 mg  1 patch Transdermal Once PRN Janeece Riggers, MD       Patient has no known allergies. Family History  Problem Relation Age of Onset  . Hypertension Mother   . Rheum arthritis Mother   . Alcohol abuse  Brother   . Heart attack Sister   . Early death Sister        During childbirth  . Thyroid disease Sister   . Colon cancer Neg Hx   . Prostate cancer Neg Hx    Social History:   reports that he has never smoked. He has never used smokeless tobacco. He reports that he drinks alcohol. He reports that he does not use drugs.   REVIEW OF SYSTEMS : Negative except for see problem lists  Physical Exam:   Blood pressure 139/88, temperature 98.1 F (36.7 C), temperature source Oral, resp. rate 16, height 5' 9.5" (1.765 m), weight 88.4 kg (194 lb 12.8 oz), SpO2 100 %. Body mass index is 28.35 kg/m.  Gen:  WDWN WM NAD  Neurological: Alert and oriented to person, place, and time. Motor and sensory function is grossly intact  Head: Normocephalic and atraumatic.  Eyes: Conjunctivae are normal. Pupils are equal, round, and reactive to light. No scleral icterus.  Neck: Normal range of motion. Neck supple. No tracheal deviation or thyromegaly present.  Cardiovascular:  SR without murmurs or gallops.  No carotid bruits Breast:  Not examined Respiratory: Effort normal.  No respiratory distress. No chest wall tenderness. Breath sounds normal.  No wheezes,  rales or rhonchi.  Abdomen:  nontender GU:  Right buttock lipoma; left medial thigh lipoma and knot on the side of his scrotum Musculoskeletal: Normal range of motion. Extremities are nontender. No cyanosis, edema or clubbing noted Lymphadenopathy: No cervical, preauricular, postauricular or axillary adenopathy is present Skin: Skin is warm and dry. No rash noted. No diaphoresis. No erythema. No pallor. Pscyh: Normal mood and affect. Behavior is normal. Judgment and thought content normal.   LABORATORY RESULTS: Results for orders placed or performed during the hospital encounter of 04/22/17 (from the past 48 hour(s))  I-STAT, chem 8     Status: Abnormal   Collection Time: 04/22/17 11:03 AM  Result Value Ref Range   Sodium 140 135 - 145 mmol/L    Potassium 3.6 3.5 - 5.1 mmol/L   Chloride 101 101 - 111 mmol/L   BUN 23 (H) 6 - 20 mg/dL   Creatinine, Ser 1.10 0.61 - 1.24 mg/dL   Glucose, Bld 131 (H) 65 - 99 mg/dL   Calcium, Ion 1.12 (L) 1.15 - 1.40 mmol/L   TCO2 29 22 - 32 mmol/L   Hemoglobin 14.3 13.0 - 17.0 g/dL   HCT 42.0 39.0 - 52.0 %     RADIOLOGY RESULTS: No results found.  Problem List: Patient Active Problem List   Diagnosis Date Noted  . Nephrolithiasis 03/17/2017  . Personal history of kidney stones 02/24/2017  . Hematuria 02/03/2017  . Mass of testicle 02/03/2017  . Pilonidal cyst 02/03/2017  . Lipoma of left lower extremity 02/03/2017  . Benign prostatic hyperplasia without lower urinary tract symptoms 01/10/2017  . Vitamin D deficiency 01/10/2017  . Acute bronchitis 01/17/2016  . Groin pain 01/11/2014  . Diabetes type 2, controlled ([place]) 09/23/2013  . Hyperlipidemia 09/23/2013  . Routine general medical examination at a health care facility 05/20/2013  . Screening for colon cancer 05/20/2013  . Erectile dysfunction 06/10/2011  . Hypertension 01/04/2011    Assessment & Plan: Three masses of the perineum to be excised.     Matt B. Hassell Done, MD, [facility] [phone] beeper [phone]  04/22/2017 1:02 PM

## 2017-04-22 NOTE — Transfer of Care (Signed)
Immediate Anesthesia Transfer of Care Note  Patient: Andrew Stevens  Procedure(s) Performed: EXCISION OF 3  MASSES OF PERIRECTAL, INNER THIGH AREAS (Bilateral Perineum)  Patient Location: PACU  Anesthesia Type:General  Level of Consciousness: sedated  Airway & Oxygen Therapy: Patient Spontanous Breathing and Patient connected to face mask oxygen  Post-op Assessment: Report given to RN and Post -op Vital signs reviewed and stable  Post vital signs: Reviewed and stable  Last Vitals:  Vitals Value Taken Time  BP 97/56 04/22/2017  2:32 PM  Temp    Pulse 61 04/22/2017  2:35 PM  Resp 9 04/22/2017  2:35 PM  SpO2 99 % 04/22/2017  2:35 PM  Vitals shown include unvalidated device data.  Last Pain:  Vitals:   04/22/17 1036  TempSrc: Oral  PainSc: 0-No pain         Complications: No apparent anesthesia complications

## 2017-04-23 ENCOUNTER — Encounter (HOSPITAL_BASED_OUTPATIENT_CLINIC_OR_DEPARTMENT_OTHER): Payer: Self-pay | Admitting: Surgery

## 2017-04-23 ENCOUNTER — Telehealth: Payer: Self-pay | Admitting: Urology

## 2017-04-23 NOTE — Telephone Encounter (Signed)
ERROR

## 2017-04-25 ENCOUNTER — Other Ambulatory Visit: Payer: Self-pay | Admitting: Family

## 2017-04-28 ENCOUNTER — Telehealth: Payer: Self-pay | Admitting: Internal Medicine

## 2017-04-28 ENCOUNTER — Telehealth: Payer: Self-pay

## 2017-04-28 NOTE — Telephone Encounter (Signed)
Copied from [place] [id number]. Topic: Bill or Statement - Patient/Guarantor Inquiry >> Apr 28, 2017 12:34 PM Synthia Innocent wrote: Patient states she was not billed for a physical, when the dr performed on, would like dx code changed.Please advise DOS 01/31/17

## 2017-04-28 NOTE — Telephone Encounter (Signed)
Please advise 

## 2017-04-28 NOTE — Telephone Encounter (Signed)
I sent it to someone else. My apologies.

## 2017-04-28 NOTE — Telephone Encounter (Signed)
Nothing is attached to message.

## 2017-04-28 NOTE — Telephone Encounter (Signed)
Copied from [place] [id number]. Topic: Bill or Statement - Patient/Guarantor Inquiry >> Apr 28, 2017 12:34 PM Andrew Stevens wrote: Patient states she was not billed for a physical, when the dr performed on, would like dx code changed.Please advise DOS 01/31/17

## 2017-04-29 ENCOUNTER — Other Ambulatory Visit: Payer: Self-pay | Admitting: Internal Medicine

## 2017-04-29 DIAGNOSIS — I10 Essential (primary) hypertension: Secondary | ICD-10-CM

## 2017-04-29 MED ORDER — AMLODIPINE BESYLATE 5 MG PO TABS: 5.0000 mg | ORAL_TABLET | Freq: Every day | ORAL | 1 refills | Status: DC

## 2017-04-29 NOTE — Telephone Encounter (Signed)
The patient thinks he had a physical . The notes states follow up. Please advise as to what was done.

## 2017-04-29 NOTE — Telephone Encounter (Signed)
Please advise 

## 2017-05-05 ENCOUNTER — Other Ambulatory Visit: Payer: Self-pay | Admitting: Radiology

## 2017-05-05 DIAGNOSIS — N200 Calculus of kidney: Secondary | ICD-10-CM

## 2017-05-19 NOTE — Telephone Encounter (Signed)
Hello please inform pt   A physical was billed on this patient 01/10/18-it was paid by insurance. He was made responsible for a portion of labs that were ordered. On 01/31/17, he was billed for a follow up for HTN, pilondial cyst, ED, etc... copay is owed.   Physical has been billed   Thanks,  [place]   Also for wife physical was billed 12/2016 inform pt

## 2017-05-19 NOTE — Telephone Encounter (Signed)
Hello please inform pt   A physical was billed on this patient 01/10/18-it was paid by insurance. He was made responsible for a portion of labs that were ordered. On 01/31/17, he was billed for a follow up for HTN, pilondial cyst, ED, etc... copay is owed.   Physical has been billed   Thanks,  [place]

## 2017-05-20 ENCOUNTER — Other Ambulatory Visit: Payer: Self-pay

## 2017-05-20 ENCOUNTER — Other Ambulatory Visit: Payer: 59

## 2017-05-20 ENCOUNTER — Encounter
Admission: RE | Admit: 2017-05-20 | Discharge: 2017-05-20 | Disposition: A | Discharge status: Home / Self Care | Payer: 59 | Source: Ambulatory Visit | Attending: Urology | Admitting: Urology

## 2017-05-20 DIAGNOSIS — N200 Calculus of kidney: Secondary | ICD-10-CM

## 2017-05-20 LAB — URINALYSIS, COMPLETE
BILIRUBIN UA: NEGATIVE
Glucose, UA: NEGATIVE
KETONES UA: NEGATIVE
NITRITE UA: NEGATIVE
SPEC GRAV UA: 1.02 (ref 1.005–1.030)
Urobilinogen, Ur: 0.2 mg/dL (ref 0.2–1.0)
pH, UA: 7 (ref 5.0–7.5)

## 2017-05-20 LAB — MICROSCOPIC EXAMINATION

## 2017-05-20 NOTE — Patient Instructions (Signed)
Your procedure is scheduled on: 05-27-17 TUESDAY Report to Same Day Surgery 2nd floor medical mall [facility] Entrance-take elevator on left to 2nd floor.  Check in with surgery information desk.) To find out your arrival time please call [phone] between 1PM - 3PM on 05-26-17 MONDAY  Remember: Instructions that are not followed completely may result in serious medical risk, up to and including death, or upon the discretion of your surgeon and anesthesiologist your surgery may need to be rescheduled.    _x___ 1. Do not eat food after midnight the night before your procedure. NO GUM OR CANDY AFTER MIDNIGHT.  You may drink WATER up to 2 hours before you are scheduled to arrive at the hospital for your procedure.  Do not drink WATER within 2 hours of your scheduled arrival to the hospital.  Type 1 and type 2 diabetics should only drink water.     __x__ 2. No Alcohol for 24 hours before or after surgery.   __x__3. No Smoking or e-cigarettes for 24 prior to surgery.  Do not use any chewable tobacco products for at least 6 hour prior to surgery   ____  4. Bring all medications with you on the day of surgery if instructed.    __x__ 5. Notify your doctor if there is any change in your medical condition     (cold, fever, infections).    x___6. On the morning of surgery brush your teeth with toothpaste and water.  You may rinse your mouth with mouth wash if you wish.  Do not swallow any toothpaste or mouthwash.   Do not wear jewelry, make-up, hairpins, clips or nail polish.  Do not wear lotions, powders, or perfumes. You may wear deodorant.  Do not shave 48 hours prior to surgery. Men may shave face and neck.  Do not bring valuables to the hospital.    [facility] is not responsible for any belongings or valuables.               Contacts, dentures or bridgework may not be worn into surgery.  Leave your suitcase in the car. After surgery it may be brought to your room.  For patients  admitted to the hospital, discharge time is determined by your treatment team.  _  Patients discharged the day of surgery will not be allowed to drive home.  You will need someone to drive you home and stay with you the night of your procedure.    Please read over the following fact sheets that you were given:   [facility] Preparing for Surgery and or MRSA Information   _x___ TAKE THE FOLLOWING MEDICATION THE MORNING OF SURGERY. These include:  1. AMLODIPINE (NORVASC)  2. LIPITOR (ATORVASTATIN)  3.  4.  5.  6.  ____Fleets enema or Magnesium Citrate as directed.   ____ Use CHG Soap or sage wipes as directed on instruction sheet   ____ Use inhalers on the day of surgery and bring to hospital day of surgery  __X__ Stop Metformin 2 days prior to surgery-LAST DOSE ON Saturday, April 27TH    ____ Take 1/2 of usual insulin dose the night before surgery and none on the morning surgery.   ____ Follow recommendations from Cardiologist, Pulmonologist or PCP regarding stopping Aspirin, Coumadin, Plavix ,Eliquis, Effient, or Pradaxa, and Pletal.  ____Stop Anti-inflammatories such as Advil, Aleve, Ibuprofen, Motrin, Naproxen, Naprosyn, Goodies powders or aspirin products NOW-OK to take Tylenol    _x___ Stop supplements until after surgery.  But may continue Vitamin D, Vitamin B,       and multivitamin.   ____ Bring C-Pap to the hospital.

## 2017-05-20 NOTE — Telephone Encounter (Signed)
Inform pt   Thanks Yarnell

## 2017-05-21 NOTE — Telephone Encounter (Signed)
Spoken to wife. Informed her of the below. She would like to cancel both appointments for husband and herself in may

## 2017-05-23 ENCOUNTER — Encounter
Admission: RE | Admit: 2017-05-23 | Discharge: 2017-05-23 | Disposition: A | Discharge status: Home / Self Care | Payer: 59 | Source: Ambulatory Visit | Attending: Urology | Admitting: Urology

## 2017-05-23 DIAGNOSIS — Z01818 Encounter for other preprocedural examination: Secondary | ICD-10-CM | POA: Diagnosis not present

## 2017-05-23 LAB — POTASSIUM: Potassium: 3.7 mmol/L (ref 3.5–5.1)

## 2017-05-24 LAB — CULTURE, URINE COMPREHENSIVE

## 2017-05-26 ENCOUNTER — Other Ambulatory Visit: Payer: Self-pay | Admitting: Radiology

## 2017-05-26 ENCOUNTER — Telehealth: Payer: Self-pay | Admitting: Radiology

## 2017-05-26 DIAGNOSIS — N200 Calculus of kidney: Secondary | ICD-10-CM

## 2017-05-26 DIAGNOSIS — R319 Hematuria, unspecified: Secondary | ICD-10-CM

## 2017-05-26 DIAGNOSIS — N390 Urinary tract infection, site not specified: Secondary | ICD-10-CM

## 2017-05-26 MED ORDER — AMOXICILLIN 875 MG PO TABS: 875.0000 mg | ORAL_TABLET | Freq: Two times a day (BID) | ORAL | 0 refills | Status: DC

## 2017-05-26 MED ORDER — LEVOFLOXACIN IN D5W 500 MG/100ML IV SOLN
500.0000 mg | Freq: Once | INTRAVENOUS | Status: AC
  Administered 2017-05-27: 500 mg via INTRAVENOUS

## 2017-05-26 NOTE — Telephone Encounter (Signed)
-----   Message from [name], MD sent at 05/26/2017 12:30 PM EDT ----- Urine culture showed a low level of enterococcus.  Will you send in an Rx for amoxicillin 875 mg twice daily for 7 days and change his preoperative antibiotic from Ancef to Levaquin 500 mg IV?  Thanks!

## 2017-05-26 NOTE — Telephone Encounter (Signed)
Notified wife of urine culture results & script sent to pharmacy per Dr [name]. Wife voices understanding. Also changed pre-op antibiotic as indicated by Dr [name].

## 2017-05-27 ENCOUNTER — Other Ambulatory Visit: Payer: Self-pay | Admitting: Internal Medicine

## 2017-05-27 ENCOUNTER — Ambulatory Visit
Admission: RE | Admit: 2017-05-27 | Discharge: 2017-05-27 | Disposition: A | Discharge status: Home / Self Care | Payer: 59 | Source: Ambulatory Visit | Attending: Urology | Admitting: Urology

## 2017-05-27 ENCOUNTER — Ambulatory Visit: Payer: 59 | Admitting: Anesthesiology

## 2017-05-27 ENCOUNTER — Encounter
Admission: RE | Disposition: A | Discharge status: Home / Self Care | Payer: Self-pay | Source: Ambulatory Visit | Attending: Urology

## 2017-05-27 ENCOUNTER — Other Ambulatory Visit: Payer: Self-pay | Admitting: Radiology

## 2017-05-27 DIAGNOSIS — I10 Essential (primary) hypertension: Secondary | ICD-10-CM | POA: Diagnosis not present

## 2017-05-27 DIAGNOSIS — F419 Anxiety disorder, unspecified: Secondary | ICD-10-CM | POA: Insufficient documentation

## 2017-05-27 DIAGNOSIS — N200 Calculus of kidney: Secondary | ICD-10-CM

## 2017-05-27 DIAGNOSIS — Z79899 Other long term (current) drug therapy: Secondary | ICD-10-CM | POA: Diagnosis not present

## 2017-05-27 DIAGNOSIS — E785 Hyperlipidemia, unspecified: Secondary | ICD-10-CM | POA: Insufficient documentation

## 2017-05-27 DIAGNOSIS — N132 Hydronephrosis with renal and ureteral calculous obstruction: Secondary | ICD-10-CM | POA: Insufficient documentation

## 2017-05-27 DIAGNOSIS — Z7984 Long term (current) use of oral hypoglycemic drugs: Secondary | ICD-10-CM | POA: Insufficient documentation

## 2017-05-27 DIAGNOSIS — E119 Type 2 diabetes mellitus without complications: Secondary | ICD-10-CM | POA: Diagnosis not present

## 2017-05-27 DIAGNOSIS — N201 Calculus of ureter: Secondary | ICD-10-CM | POA: Diagnosis not present

## 2017-05-27 HISTORY — PX: CYSTOSCOPY/URETEROSCOPY/HOLMIUM LASER: SHX6545

## 2017-05-27 HISTORY — PX: CYSTOSCOPY W/ URETERAL STENT PLACEMENT: SHX1429

## 2017-05-27 LAB — GLUCOSE, CAPILLARY
GLUCOSE-CAPILLARY: 162 mg/dL — AB (ref 65–99)
Glucose-Capillary: 127 mg/dL — ABNORMAL HIGH (ref 65–99)

## 2017-05-27 SURGERY — CYSTOURETEROSCOPY, USING HOLMIUM LASER
Anesthesia: General | Site: Ureter | Laterality: Right | Wound class: Clean Contaminated

## 2017-05-27 MED ORDER — IOTHALAMATE MEGLUMINE 43 % IV SOLN
INTRAVENOUS | Status: DC | PRN
  Administered 2017-05-27: 20 mL via URETHRAL

## 2017-05-27 MED ORDER — FENTANYL CITRATE (PF) 100 MCG/2ML IJ SOLN
INTRAMUSCULAR | Status: DC | PRN
  Administered 2017-05-27 (×4): 50 ug via INTRAVENOUS

## 2017-05-27 MED ORDER — PROPOFOL 10 MG/ML IV BOLUS
INTRAVENOUS | Status: AC
  Filled 2017-05-27: qty 20

## 2017-05-27 MED ORDER — TAMSULOSIN HCL 0.4 MG PO CAPS: 0.4000 mg | ORAL_CAPSULE | Freq: Every day | ORAL | 0 refills | Status: DC

## 2017-05-27 MED ORDER — SUGAMMADEX SODIUM 200 MG/2ML IV SOLN
INTRAVENOUS | Status: AC
  Filled 2017-05-27: qty 2

## 2017-05-27 MED ORDER — MIDAZOLAM HCL 2 MG/2ML IJ SOLN
INTRAMUSCULAR | Status: AC
  Filled 2017-05-27: qty 2

## 2017-05-27 MED ORDER — FAMOTIDINE 20 MG PO TABS
20.0000 mg | ORAL_TABLET | Freq: Once | ORAL | Status: AC
  Administered 2017-05-27: 20 mg via ORAL

## 2017-05-27 MED ORDER — OXYBUTYNIN CHLORIDE 5 MG PO TABS: ORAL_TABLET | ORAL | 0 refills | Status: DC

## 2017-05-27 MED ORDER — ONDANSETRON HCL 4 MG/2ML IJ SOLN
INTRAMUSCULAR | Status: DC | PRN
  Administered 2017-05-27: 4 mg via INTRAVENOUS

## 2017-05-27 MED ORDER — LEVOFLOXACIN IN D5W 500 MG/100ML IV SOLN
INTRAVENOUS | Status: AC
  Filled 2017-05-27: qty 100

## 2017-05-27 MED ORDER — ONDANSETRON HCL 4 MG/2ML IJ SOLN
INTRAMUSCULAR | Status: AC
  Filled 2017-05-27: qty 2

## 2017-05-27 MED ORDER — OXYCODONE HCL 5 MG/5ML PO SOLN: 5.0000 mg | Freq: Once | ORAL | Status: DC | PRN

## 2017-05-27 MED ORDER — FENTANYL CITRATE (PF) 100 MCG/2ML IJ SOLN
INTRAMUSCULAR | Status: AC
  Filled 2017-05-27: qty 2

## 2017-05-27 MED ORDER — SUCCINYLCHOLINE CHLORIDE 20 MG/ML IJ SOLN
INTRAMUSCULAR | Status: DC | PRN
  Administered 2017-05-27: 100 mg via INTRAVENOUS

## 2017-05-27 MED ORDER — LIDOCAINE HCL (PF) 2 % IJ SOLN
INTRAMUSCULAR | Status: AC
  Filled 2017-05-27: qty 10

## 2017-05-27 MED ORDER — DEXAMETHASONE SODIUM PHOSPHATE 10 MG/ML IJ SOLN
INTRAMUSCULAR | Status: DC | PRN
  Administered 2017-05-27: 5 mg via INTRAVENOUS

## 2017-05-27 MED ORDER — HYDROCODONE-ACETAMINOPHEN 5-325 MG PO TABS: 1.0000 | ORAL_TABLET | Freq: Four times a day (QID) | ORAL | 0 refills | Status: DC | PRN

## 2017-05-27 MED ORDER — TELMISARTAN-HCTZ 80-25 MG PO TABS: 1.0000 | ORAL_TABLET | ORAL | 3 refills | Status: DC

## 2017-05-27 MED ORDER — ROCURONIUM BROMIDE 100 MG/10ML IV SOLN
INTRAVENOUS | Status: DC | PRN
  Administered 2017-05-27: 20 mg via INTRAVENOUS
  Administered 2017-05-27: 30 mg via INTRAVENOUS

## 2017-05-27 MED ORDER — MIDAZOLAM HCL 2 MG/2ML IJ SOLN
INTRAMUSCULAR | Status: DC | PRN
  Administered 2017-05-27: 2 mg via INTRAVENOUS

## 2017-05-27 MED ORDER — SUGAMMADEX SODIUM 200 MG/2ML IV SOLN
INTRAVENOUS | Status: DC | PRN
  Administered 2017-05-27: 200 mg via INTRAVENOUS

## 2017-05-27 MED ORDER — FENTANYL CITRATE (PF) 100 MCG/2ML IJ SOLN: 25.0000 ug | INTRAMUSCULAR | Status: DC | PRN

## 2017-05-27 MED ORDER — OXYCODONE HCL 5 MG PO TABS: 5.0000 mg | ORAL_TABLET | Freq: Once | ORAL | Status: DC | PRN

## 2017-05-27 MED ORDER — SODIUM CHLORIDE 0.9 % IV SOLN
INTRAVENOUS | Status: DC
  Administered 2017-05-27: 08:00:00 via INTRAVENOUS

## 2017-05-27 MED ORDER — FAMOTIDINE 20 MG PO TABS
ORAL_TABLET | ORAL | Status: AC
  Administered 2017-05-27: 20 mg via ORAL
  Filled 2017-05-27: qty 1

## 2017-05-27 MED ORDER — GLYCOPYRROLATE 0.2 MG/ML IJ SOLN
INTRAMUSCULAR | Status: DC | PRN
  Administered 2017-05-27: 0.2 mg via INTRAVENOUS

## 2017-05-27 MED ORDER — DEXAMETHASONE SODIUM PHOSPHATE 10 MG/ML IJ SOLN
INTRAMUSCULAR | Status: AC
  Filled 2017-05-27: qty 1

## 2017-05-27 MED ORDER — PROPOFOL 10 MG/ML IV BOLUS
INTRAVENOUS | Status: DC | PRN
  Administered 2017-05-27: 150 mg via INTRAVENOUS

## 2017-05-27 MED ORDER — LIDOCAINE HCL (CARDIAC) PF 100 MG/5ML IV SOSY
PREFILLED_SYRINGE | INTRAVENOUS | Status: DC | PRN
  Administered 2017-05-27: 50 mg via INTRAVENOUS

## 2017-05-27 MED ORDER — GLYCOPYRROLATE 0.2 MG/ML IJ SOLN
INTRAMUSCULAR | Status: AC
  Filled 2017-05-27: qty 1

## 2017-05-27 SURGICAL SUPPLY — 30 items
BAG DRAIN CYSTO-URO LG1000N (MISCELLANEOUS) ×3 IMPLANT
BASKET ZERO TIP 1.9FR (BASKET) ×3 IMPLANT
BRUSH SCRUB EZ 1% IODOPHOR (MISCELLANEOUS) ×3 IMPLANT
CATH URETL 5X70 OPEN END (CATHETERS) ×3 IMPLANT
CNTNR SPEC 2.5X3XGRAD LEK (MISCELLANEOUS) ×2
CONRAY 43 FOR UROLOGY 50M (MISCELLANEOUS) ×3 IMPLANT
CONT SPEC 4OZ STER OR WHT (MISCELLANEOUS) ×1
CONTAINER SPEC 2.5X3XGRAD LEK (MISCELLANEOUS) ×2 IMPLANT
DRAPE UTILITY 15X26 TOWEL STRL (DRAPES) ×3 IMPLANT
FIBER LASER LITHO 273 (Laser) ×6 IMPLANT
GLOVE BIO SURGEON STRL SZ8 (GLOVE) ×3 IMPLANT
GOWN STRL REUS W/ TWL LRG LVL3 (GOWN DISPOSABLE) ×4 IMPLANT
GOWN STRL REUS W/TWL LRG LVL3 (GOWN DISPOSABLE) ×2
GUIDEWIRE GREEN .038 145CM (MISCELLANEOUS) IMPLANT
INFUSOR MANOMETER BAG 3000ML (MISCELLANEOUS) ×6 IMPLANT
INTRODUCER DILATOR DOUBLE (INTRODUCER) ×3 IMPLANT
KIT TURNOVER CYSTO (KITS) ×3 IMPLANT
PACK CYSTO AR (MISCELLANEOUS) ×3 IMPLANT
SENSORWIRE 0.038 NOT ANGLED (WIRE) ×6
SET CYSTO W/LG BORE CLAMP LF (SET/KITS/TRAYS/PACK) ×3 IMPLANT
SHEATH URETERAL 12FR 45CM (SHEATH) ×3 IMPLANT
SHEATH URETERAL 12FRX35CM (MISCELLANEOUS) IMPLANT
SOL .9 NS 3000ML IRR  AL (IV SOLUTION) ×2
SOL .9 NS 3000ML IRR UROMATIC (IV SOLUTION) ×4 IMPLANT
STENT URET 6FRX24 CONTOUR (STENTS) IMPLANT
STENT URET 6FRX26 CONTOUR (STENTS) IMPLANT
STENT URO INLAY 6FRX24CM (STENTS) ×3 IMPLANT
SURGILUBE 2OZ TUBE FLIPTOP (MISCELLANEOUS) ×3 IMPLANT
WATER STERILE IRR 1000ML POUR (IV SOLUTION) ×3 IMPLANT
WIRE SENSOR 0.038 NOT ANGLED (WIRE) ×4 IMPLANT

## 2017-05-27 NOTE — Anesthesia Postprocedure Evaluation (Signed)
Anesthesia Post Note  Patient: VEER ELAMIN  Procedure(s) Performed: CYSTOSCOPY/URETEROSCOPY/HOLMIUM LASER (Right Ureter) CYSTOSCOPY WITH STENT REPLACEMENT (Right Ureter)  Patient location during evaluation: PACU Anesthesia Type: General Level of consciousness: awake and alert Pain management: pain level controlled Vital Signs Assessment: post-procedure vital signs reviewed and stable Respiratory status: spontaneous breathing, nonlabored ventilation, respiratory function stable and patient connected to nasal cannula oxygen Cardiovascular status: blood pressure returned to baseline and stable Postop Assessment: no apparent nausea or vomiting Anesthetic complications: no     Last Vitals:  Vitals:   05/27/17 1229 05/27/17 1246  BP: (!) 143/95 (!) 143/92  Pulse: (!) 59 (!) 55  Resp: 12 13  Temp:    SpO2: 99% 100%    Last Pain:  Vitals:   05/27/17 1200  TempSrc:   PainSc: Asleep                 Precious Haws Donne Baley

## 2017-05-27 NOTE — Progress Notes (Signed)
Pt voided again  Rust colored urine   75cc

## 2017-05-27 NOTE — Anesthesia Post-op Follow-up Note (Signed)
Anesthesia QCDR form completed.        

## 2017-05-27 NOTE — Anesthesia Procedure Notes (Signed)
Procedure Name: Intubation Performed by: Rolla Plate, CRNA Pre-anesthesia Checklist: Patient identified, Patient being monitored, Timeout performed, Emergency Drugs available and Suction available Patient Re-evaluated:Patient Re-evaluated prior to induction Oxygen Delivery Method: Circle system utilized Preoxygenation: Pre-oxygenation with 100% oxygen Induction Type: IV induction Ventilation: Mask ventilation without difficulty Laryngoscope Size: McGraph and 4 Grade View: Grade I Tube type: Oral Tube size: 7.5 mm Number of attempts: 1 Airway Equipment and Method: Stylet and Video-laryngoscopy Placement Confirmation: ETT inserted through vocal cords under direct vision,  positive ETCO2 and breath sounds checked- equal and bilateral Secured at: 22 cm Tube secured with: Tape Dental Injury: Teeth and Oropharynx as per pre-operative assessment  Difficulty Due To: Difficulty was anticipated, Difficult Airway- due to limited oral opening and Difficult Airway- due to anterior larynx Future Recommendations: Recommend- induction with short-acting agent, and alternative techniques readily available

## 2017-05-27 NOTE — Progress Notes (Signed)
Pt voided 150cc rust tinged urine

## 2017-05-27 NOTE — Op Note (Signed)
Preoperative diagnosis:  1.  Right ureteral calculus 2.  Right nephrolithiasis  Postoperative diagnosis:  1.  Right ureteral calculus 2.  Right nephrolithiasis  Procedure:  1. Cystoscopy 2. Right ureteroscopy and stone removal 3. Ureteroscopic laser lithotripsy 4. Right ureteral stent placement  5. Right retrograde pyelography with interpretation  Surgeon: Nicki Reaper C. Dawsen Krieger, M.D.  Anesthesia: General  Complications: None  Intraoperative findings:  1.  Right retrograde pyelography post procedure showed no filling defects, stone fragments or contrast extravasation.  There was moderate hydronephrosis present but significantly improved from retrograde pyelogram of 04/11/2017.  EBL: Minimal  Specimens: 1. Calculus fragments for analysis   Indication: Andrew Stevens is a 57 y.o. year old patient with a 10 x 19 mm right proximal ureteral calculus with severe hydronephrosis in addition to right nephrolithiasis.  He requested ureteroscopic treatment and underwent initial ureteroscopy on 04/11/2017 with findings of an impacted stone of which access to the renal pelvis was not possible.  He underwent flexible ureteroscopy with laser lithotripsy until access to the collecting system could be obtained.  He had partia stonel treatment and stent placement and presents for completion of his staged ureteroscopy.  After reviewing the management options for treatment, the patient elected to proceed with the above surgical procedure(s). We have discussed the potential benefits and risks of the procedure, side effects of the proposed treatment, the likelihood of the patient achieving the goals of the procedure, and any potential problems that might occur during the procedure or recuperation. Informed consent has been obtained.  Description of procedure:  The patient was taken to the operating room and general anesthesia was induced.  The patient was placed in the dorsal lithotomy position, prepped and  draped in the usual sterile fashion, and preoperative antibiotics were administered. A preoperative time-out was performed.   A 22 French cystoscope was lubricated and passed under direct vision.  The urethra was normal in caliber without stricture.  The prostate demonstrated mild lateral lobe enlargement and mild bladder neck elevation.  Panendoscopy was performed and the bladder mucosa showed no erythema, solid or papillary lesions.  The right ureteral stent was grasped with endoscopic forceps and brought out to the urethral meatus.  A 0.038 Sensor wire was then placed through the stent and advanced to the renal pelvis under fluoroscopic guidance.  The ureteral stent was removed.  A dual-lumen catheter was placed over the sensor wire and a second sensor wire was placed as a safety wire.  A ureteral access sheath was placed over the working wire under fluoroscopic guidance without difficulty.  The inner stylette and working wire were removed.  A digital flexible ureteroscope was then placed through the access sheath and advanced to the proximal ureter.  Just below the UPJ the partially treated ureteral calculus was identified with the remaining calculus embedded into the ureteral wall posteriorly, medially and anteriorly.  A 273 m holmium laser fiber was placed through the ureteroscope and due to the embedded nature the calculus this portion of the procedure was quite tedious with dusting of the embedded stone at initial settings of 0.2 J / 20 Hz which was increased to 0.2 J / 40 Hz.   Some larger fragments were removed with 1.9 French nitinol basket and several of the fragments migrated to the right renal pelvis.  Once the ureteral calculus was completely treated the ureteroscope was advanced to the bladder.  A 10 mm lower pole calculus was noted which was grasped with the 1.9 [place] basket and  relocated to an upper pole calyx.  The stone was then dusted in a similar fashion.  All significant size  fragments were then removed with a 1.9 [place] nitinol basket.  Retrograde pyelogram was then performed with findings as described above.  All calyces were examined and only fragments estimated at less than 1 mm were identified.  The ureteroscope and access sheath were simultaneous removed and no additional ureteral fragments or evidence of ureteral injury was identified.  The guidewire was backloaded on a cystoscope and a 6 French/24 cm Bard Optima double-J ureteral stent was then placed over the safety wire with good positioning noted proximally and distally.  The stent will remain indwelling for 6 weeks.   The bladder was then emptied and the procedure ended.  The patient appeared to tolerate the procedure well and without complications.  After anesthetic reversal the patient was transported to the PACU in stable condition.    [name], MD

## 2017-05-27 NOTE — Transfer of Care (Signed)
Immediate Anesthesia Transfer of Care Note  Patient: Andrew Stevens  Procedure(s) Performed: CYSTOSCOPY/URETEROSCOPY/HOLMIUM LASER (Right Ureter) CYSTOSCOPY WITH STENT REPLACEMENT (Right Ureter)  Patient Location: PACU  Anesthesia Type:General  Level of Consciousness: sedated  Airway & Oxygen Therapy: Patient Spontanous Breathing and Patient connected to face mask oxygen  Post-op Assessment: Report given to RN and Post -op Vital signs reviewed and stable  Post vital signs: Reviewed  Last Vitals:  Vitals Value Taken Time  BP 112/75 05/27/2017 12:01 PM  Temp 36.2 C 05/27/2017 12:01 PM  Pulse 53 05/27/2017 12:01 PM  Resp 7 05/27/2017 12:01 PM  SpO2 100 % 05/27/2017 12:01 PM  Vitals shown include unvalidated device data.  Last Pain:  Vitals:   05/27/17 0952  TempSrc:   PainSc: (P) 5          Complications: No apparent anesthesia complications

## 2017-05-27 NOTE — Anesthesia Preprocedure Evaluation (Signed)
Anesthesia Evaluation  Patient identified by MRN, date of birth, ID band Patient awake    Reviewed: Allergy & Precautions, H&P , NPO status , Patient's Chart, lab work & pertinent test results  History of Anesthesia Complications (+) DIFFICULT AIRWAY and history of anesthetic complications  Airway Mallampati: III  TM Distance: <3 FB Neck ROM: full    Dental  (+) Chipped   Pulmonary neg pulmonary ROS, neg shortness of breath,           Cardiovascular Exercise Tolerance: Good hypertension, (-) angina(-) Past MI and (-) DOE      Neuro/Psych PSYCHIATRIC DISORDERS Anxiety negative neurological ROS     GI/Hepatic negative GI ROS, Neg liver ROS, neg GERD  ,  Endo/Other  diabetes, Type 2  Renal/GU Renal disease     Musculoskeletal   Abdominal   Peds  Hematology negative hematology ROS (+)   Anesthesia Other Findings Past Medical History: No date: Anxiety No date: Diabetes mellitus without complication (HCC) No date: Erectile dysfunction No date: History of kidney stones No date: Hyperlipidemia No date: Hypertension No date: Kidney stones No date: Lipoma of extremity  Past Surgical History: 1989: BRAIN SURGERY     Comment:  Cyst removed - benign No date: INGUINAL HERNIA REPAIR 2013: ROTATOR CUFF REPAIR; Right     Comment:  Dr. Rudene Christians  2014: ROTATOR CUFF REPAIR; Right     Comment:  Dr. Mack Guise No date: [place]     Reproductive/Obstetrics negative OB ROS                             Anesthesia Physical  Anesthesia Plan  ASA: III  Anesthesia Plan: General ETT   Post-op Pain Management:    Induction: Intravenous  PONV Risk Score and Plan: Ondansetron, Dexamethasone and Midazolam  Airway Management Planned: Oral ETT and Video Laryngoscope Planned  Additional Equipment:   Intra-op Plan:   Post-operative Plan: Extubation in OR  Informed Consent: I have  reviewed the patients History and Physical, chart, labs and discussed the procedure including the risks, benefits and alternatives for the proposed anesthesia with the patient or authorized representative who has indicated his/her understanding and acceptance.   Dental Advisory Given  Plan Discussed with: Anesthesiologist, CRNA and Surgeon  Anesthesia Plan Comments: (Patient consented for risks of anesthesia including but not limited to:  - adverse reactions to medications - damage to teeth, lips or other oral mucosa - sore throat or hoarseness - Damage to heart, brain, lungs or loss of life  Patient voiced understanding.)        Anesthesia Quick Evaluation

## 2017-05-27 NOTE — Interval H&P Note (Signed)
History and Physical Interval Note:  05/27/2017 8:20 AM  Andrew Stevens  has presented today for surgery, with the diagnosis of right nephrolithiasis  The various methods of treatment have been discussed with the patient and family. After consideration of risks, benefits and other options for treatment, the patient has consented to  Procedure(s): CYSTOSCOPY/URETEROSCOPY/HOLMIUM LASER/STENT Exchange (Right) as a surgical intervention .  The patient's history has been reviewed, patient examined, no change in status, stable for surgery.  I have reviewed the patient's chart and labs.  Questions were answered to the patient's satisfaction.     [place]

## 2017-05-27 NOTE — H&P (Signed)
05/27/2017 8:04 AM   Andrew Stevens [birth year] [id number]  Referring provider: No referring provider defined for this encounter.   HPI: 57 year old male with right nephrolithiasis and a 10 x 19 right proximal ureteral calculus with severe hydronephrosis.  He underwent initial ureteroscopy on 04/11/2017 and the stone was impacted and incompletely treated.  He presents today for follow-up ureteroscopy, laser lithotripsy and calculus removal.   PMH: Past Medical History:  Diagnosis Date  . Anxiety   . Diabetes mellitus without complication ([place])   . Difficult intubation   . Erectile dysfunction   . History of kidney stones   . Hyperlipidemia   . Hypertension   . Kidney stones   . Lipoma of extremity     Surgical History: Past Surgical History:  Procedure Laterality Date  . BRAIN SURGERY  1989   Cyst removed - benign  . CYSTOSCOPY/URETEROSCOPY/HOLMIUM LASER/STENT PLACEMENT Right 04/11/2017   Procedure: CYSTOSCOPY/URETEROSCOPY/HOLMIUM LASER/STENT PLACEMENT;  Surgeon: [name], MD;  Location: [facility];  Service: Urology;  Laterality: Right;  . INGUINAL HERNIA REPAIR    . MASS EXCISION Bilateral 04/22/2017   Procedure: EXCISION OF 3  MASSES OF PERIRECTAL, INNER THIGH AREAS;  Surgeon: Johnathan Hausen, MD;  Location: [place];  Service: General;  Laterality: Bilateral;  LMA  . ROTATOR CUFF REPAIR Right 2013   Dr. Rudene Christians   . ROTATOR CUFF REPAIR Right 2014   Dr. Mack Guise  . VENTRAL HERNIA REPAIR      Home Medications:  Reviewed  Allergies: No Known Allergies  Family History: Family History  Problem Relation Age of Onset  . Hypertension Mother   . Rheum arthritis Mother   . Alcohol abuse Brother   . Heart attack Sister   . Early death Sister        During childbirth  . Thyroid disease Sister   . Colon cancer Neg Hx   . Prostate cancer Neg Hx     Social History:  reports that he has never smoked. He has never used smokeless tobacco. He reports  that he drinks alcohol. He reports that he does not use drugs.  ROS: Otherwise noncontributory  Physical Exam: BP (!) 157/90   Pulse 69   Temp (!) 97.2 F (36.2 C) (Temporal)   Resp 17   Ht 5\' 9"  (1.753 m)   Wt 199 lb (90.3 kg)   SpO2 100%   BMI 29.39 kg/m   Constitutional:  Alert and oriented, No acute distress. HEENT: [place] AT, moist mucus membranes.  Trachea midline, no masses. Cardiovascular: No clubbing, cyanosis, or edema.  RRR Respiratory: Normal respiratory effort, no increased work of breathing.  Lungs clear GI: Abdomen is soft, nontender, nondistended, no abdominal masses GU: No CVA tenderness Lymph: No cervical or inguinal lymphadenopathy. Skin: No rashes, bruises or suspicious lesions. Neurologic: Grossly intact, no focal deficits, moving all 4 extremities. Psychiatric: Normal mood and affect.   Pertinent Imaging:  Results for orders placed during the hospital encounter of 02/21/17  Abdomen 1 view (KUB)   Narrative CLINICAL DATA:  Hematuria  EXAM: ABDOMEN - 1 VIEW  COMPARISON:  09/11/2009 unenhanced CT abdomen/pelvis  FINDINGS: There are multiple clustered stones in mid to lower right kidney, largest 12 mm and 7 mm. There is a 19 x 10 mm stone overlying the expected location of the right ureteropelvic junction. There are multiple clustered stones in mid to lower left kidney, largest 10 mm and 11 mm. No definite urolithiasis in the pelvis. No dilated small  bowel loops. Mild stool throughout the large bowel. No evidence of pneumatosis or pneumoperitoneum. Mild-to-moderate lumbar spondylosis.  IMPRESSION: Bilateral nephrolithiasis. A 19 x 10 mm stone overlies the expected location of the right UPJ, cannot exclude an obstructing right urinary tract stone. Correlate with unenhanced CT abdomen/pelvis as clinically warranted.   Electronically Signed   By: Ilona Sorrel M.D.   On: 02/22/2017 17:21     Results for orders placed during the hospital encounter  of 03/21/17  Ultrasound renal complete   Narrative CLINICAL DATA:  Hematuria  EXAM: RENAL / URINARY TRACT ULTRASOUND COMPLETE  COMPARISON:  KUB 02/21/2017.  CT abdomen pelvis 09/11/2009  FINDINGS: Right Kidney:  Length: 12.4 cm. Severe right hydronephrosis. 22 mm calculus in the right lower pole collecting system. Dilated renal pelvis and ureter. 16 mm stone in the proximal left ureter. Cortical thinning.  Left Kidney:  Length: 12.8 cm. Echogenicity within normal limits. No mass or hydronephrosis visualized. 13 mm left lower pole calculus  Bladder:  Bilateral ureteral jets.  IMPRESSION: Severe right hydronephrosis. 22 mm right lower pole calculus. 16 mm stone proximal left ureter.  13 mm left lower pole calculus without obstruction.   Electronically Signed   By: Franchot Gallo M.D.   On: 03/21/2017 16:26      Assessment & Plan:   57 year old male presents for follow-up right ureteroscopy, laser lithotripsy and stone removal for a large right proximal ureteral calculus and right nephrolithiasis.  The procedure has been discussed in detail including potential risks of bleeding, infection, ureteral injury, ureteral stricture.  The possibility of residual calculi was also discussed.  He indicated all questions were answered and desires to proceed.  [name], [place] [address], [place], [place] [address] [phone]

## 2017-05-27 NOTE — Discharge Instructions (Signed)

## 2017-05-28 ENCOUNTER — Other Ambulatory Visit: Payer: Self-pay | Admitting: Radiology

## 2017-06-02 ENCOUNTER — Other Ambulatory Visit: Payer: Self-pay | Admitting: Family

## 2017-06-02 LAB — STONE ANALYSIS
Ca Oxalate,Monohydr.: 90 %
Ca phos cry stone ql IR: 10 %
STONE WEIGHT KSTONE: 355.6 mg

## 2017-06-04 NOTE — Telephone Encounter (Signed)
Last filled 06/02/16 Mable Paris, FNP lov 03/14/17 Dr Aundra Dubin

## 2017-06-13 ENCOUNTER — Ambulatory Visit: Payer: Self-pay | Admitting: Internal Medicine

## 2017-06-25 ENCOUNTER — Ambulatory Visit: Payer: 59 | Admitting: Urology

## 2017-07-08 ENCOUNTER — Ambulatory Visit (INDEPENDENT_AMBULATORY_CARE_PROVIDER_SITE_OTHER): Payer: 59 | Admitting: Urology

## 2017-07-08 ENCOUNTER — Encounter: Payer: Self-pay | Admitting: Urology

## 2017-07-08 VITALS — BP 146/83 | HR 66 | Ht 69.5 in | Wt 197.4 lb

## 2017-07-08 DIAGNOSIS — N390 Urinary tract infection, site not specified: Secondary | ICD-10-CM | POA: Diagnosis not present

## 2017-07-08 DIAGNOSIS — N200 Calculus of kidney: Secondary | ICD-10-CM

## 2017-07-08 DIAGNOSIS — R319 Hematuria, unspecified: Secondary | ICD-10-CM | POA: Diagnosis not present

## 2017-07-08 LAB — MICROSCOPIC EXAMINATION: Epithelial Cells (non renal): NONE SEEN /hpf (ref 0–10)

## 2017-07-08 LAB — URINALYSIS, COMPLETE
BILIRUBIN UA: NEGATIVE
GLUCOSE, UA: NEGATIVE
Ketones, UA: NEGATIVE
NITRITE UA: NEGATIVE
PH UA: 5 (ref 5.0–7.5)
Specific Gravity, UA: 1.02 (ref 1.005–1.030)
UUROB: 0.2 mg/dL (ref 0.2–1.0)

## 2017-07-08 MED ORDER — CIPROFLOXACIN HCL 500 MG PO TABS
500.0000 mg | ORAL_TABLET | Freq: Once | ORAL | Status: AC
Start: 2017-07-08 — End: 2017-07-08
  Administered 2017-07-08: 500 mg via ORAL

## 2017-07-08 MED ORDER — LIDOCAINE HCL URETHRAL/MUCOSAL 2 % EX GEL
1.0000 "application " | Freq: Once | CUTANEOUS | Status: AC
  Administered 2017-07-08: 1 via URETHRAL

## 2017-07-08 NOTE — Progress Notes (Signed)
Indications: Patient is 57 y.o., male who recently underwent staged ureteroscopy for an impacted 10 x 19 mm right proximal ureteral calculus with severe hydronephrosis.  His follow-up procedure was performed on 05/27/2017.  The patient is presenting today for stent removal.  Procedure:  Flexible Cystoscopy with stent removal (12458)  Timeout was performed and the correct patient, procedure and participants were identified.    Description:  The patient was prepped and draped in the usual sterile fashion. Flexible cystosopy was performed.  The stent was visualized, grasped, and removed intact without difficulty. The patient tolerated the procedure well.  A single dose of oral antibiotics was given.  Complications:  None  Plan: He was instructed to call for development of right flank pain post stent removal.  He does have nonobstructing left renal calculi and a follow-up KUB was ordered.  Would recommend a follow-up renal ultrasound and possible renal scan based on ultrasonic findings.

## 2017-07-11 ENCOUNTER — Encounter: Payer: Self-pay | Admitting: Internal Medicine

## 2017-07-18 ENCOUNTER — Ambulatory Visit: Payer: Self-pay | Admitting: Internal Medicine

## 2017-08-12 ENCOUNTER — Other Ambulatory Visit: Payer: Self-pay

## 2017-08-12 ENCOUNTER — Ambulatory Visit
Admission: RE | Admit: 2017-08-12 | Discharge: 2017-08-12 | Disposition: A | Discharge status: Home / Self Care | Payer: 59 | Source: Ambulatory Visit | Attending: Urology | Admitting: Urology

## 2017-08-12 DIAGNOSIS — N200 Calculus of kidney: Secondary | ICD-10-CM | POA: Insufficient documentation

## 2017-08-16 ENCOUNTER — Other Ambulatory Visit: Payer: Self-pay | Admitting: Urology

## 2017-08-16 DIAGNOSIS — N200 Calculus of kidney: Secondary | ICD-10-CM

## 2017-08-18 ENCOUNTER — Telehealth: Payer: Self-pay

## 2017-08-18 NOTE — Telephone Encounter (Signed)
Left vm for pt to call office

## 2017-08-18 NOTE — Telephone Encounter (Signed)
-----   Message from [name], MD sent at 08/16/2017  1:38 PM EDT ----- KUB showed some residual stone fragments in the right kidney although it is difficult to tell due to overlying stool.  Left renal calculi stable.  I had also wanted him to get a renal ultrasound and it does not look like this was scheduled.  An order was entered.

## 2017-08-29 ENCOUNTER — Ambulatory Visit
Admission: RE | Admit: 2017-08-29 | Discharge: 2017-08-29 | Disposition: A | Discharge status: Home / Self Care | Payer: 59 | Source: Ambulatory Visit | Attending: Urology | Admitting: Urology

## 2017-08-29 DIAGNOSIS — N200 Calculus of kidney: Secondary | ICD-10-CM

## 2017-08-29 DIAGNOSIS — N132 Hydronephrosis with renal and ureteral calculous obstruction: Secondary | ICD-10-CM | POA: Diagnosis not present

## 2017-10-09 ENCOUNTER — Telehealth: Payer: Self-pay | Admitting: Internal Medicine

## 2017-10-09 NOTE — Telephone Encounter (Signed)
FYI

## 2017-10-09 NOTE — Telephone Encounter (Signed)
Copied from [id number]. Topic: Quick Communication - See Telephone Encounter >> Oct 09, 2017  3:30 PM Blase Mess A wrote: CRM for notification. See Telephone encounter for: 10/09/17. Patient called and wanted to let Dr. Linus Orn know that he received his flu vaccine.

## 2017-10-20 ENCOUNTER — Telehealth: Payer: Self-pay | Admitting: Urology

## 2017-10-20 DIAGNOSIS — N200 Calculus of kidney: Secondary | ICD-10-CM

## 2017-10-20 NOTE — Telephone Encounter (Signed)
Patient thinks he has another stone he is coming in on Wednesday can you order a KUB for him to get prior to this app please.   Thanks, Sharyn Lull

## 2017-10-21 ENCOUNTER — Ambulatory Visit
Admission: RE | Admit: 2017-10-21 | Discharge: 2017-10-21 | Disposition: A | Discharge status: Home / Self Care | Payer: 59 | Source: Ambulatory Visit | Attending: Urology | Admitting: Urology

## 2017-10-21 DIAGNOSIS — N200 Calculus of kidney: Secondary | ICD-10-CM | POA: Insufficient documentation

## 2017-10-22 ENCOUNTER — Encounter: Payer: Self-pay | Admitting: Urology

## 2017-10-22 ENCOUNTER — Ambulatory Visit (INDEPENDENT_AMBULATORY_CARE_PROVIDER_SITE_OTHER): Payer: 59 | Admitting: Urology

## 2017-10-22 VITALS — BP 150/84 | HR 67 | Ht 69.5 in | Wt 200.4 lb

## 2017-10-22 DIAGNOSIS — N200 Calculus of kidney: Secondary | ICD-10-CM | POA: Diagnosis not present

## 2017-10-22 NOTE — Progress Notes (Signed)
10/22/2017 3:55 PM   Andrew Stevens 03-Apr-1960 [id number]  Referring provider: McLean-Scocuzza, Nino Glow, MD [place], [place] [address]  Chief Complaint  Patient presents with  . Nephrolithiasis    HPI: 57 year old male presents for follow-up of nephrolithiasis.  He underwent staged ureteroscopic treatment of a 10 x 19 mm right proximal ureteral calculus with severe hydronephrosis in addition to multiple right renal calculi.  He declined PCNL.  A follow-up KUB and renal ultrasound showed multiple small and fragments in the right kidney.  There was mild to moderate hydronephrosis present and parenchymal cortical thinning.  The hydronephrosis was much less marked than preoperatively.  He also has nonobstructing left renal calculi.  Over the past week he has noted dull left flank pressure.  He rates the pain at 3/10.  He has no voiding symptoms.  Denies fever, chills, nausea and vomiting.  KUB performed today was reviewed and there are multiple small calculi overlying the right renal outline.  There are left lower pole calculi present.  PMH: Past Medical History:  Diagnosis Date  . Anxiety   . Diabetes mellitus without complication ([place])   . Difficult intubation   . Erectile dysfunction   . History of kidney stones   . Hyperlipidemia   . Hypertension   . Kidney stones   . Lipoma of extremity     Surgical History: Past Surgical History:  Procedure Laterality Date  . BRAIN SURGERY  1989   Cyst removed - benign  . CYSTOSCOPY W/ URETERAL STENT PLACEMENT Right 05/27/2017   Procedure: CYSTOSCOPY WITH STENT REPLACEMENT;  Surgeon: [name], MD;  Location: [facility];  Service: Urology;  Laterality: Right;  . CYSTOSCOPY/URETEROSCOPY/HOLMIUM LASER Right 05/27/2017   Procedure: CYSTOSCOPY/URETEROSCOPY/HOLMIUM LASER;  Surgeon: [name], MD;  Location: [facility];  Service: Urology;  Laterality: Right;  . CYSTOSCOPY/URETEROSCOPY/HOLMIUM LASER/STENT PLACEMENT  Right 04/11/2017   Procedure: CYSTOSCOPY/URETEROSCOPY/HOLMIUM LASER/STENT PLACEMENT;  Surgeon: [name], MD;  Location: [facility];  Service: Urology;  Laterality: Right;  . INGUINAL HERNIA REPAIR    . MASS EXCISION Bilateral 04/22/2017   Procedure: EXCISION OF 3  MASSES OF PERIRECTAL, INNER THIGH AREAS;  Surgeon: Johnathan Hausen, MD;  Location: [facility];  Service: General;  Laterality: Bilateral;  LMA  . ROTATOR CUFF REPAIR Right 2013   Dr. Rudene Christians   . ROTATOR CUFF REPAIR Right 2014   Dr. Mack Guise  . VENTRAL HERNIA REPAIR      Home Medications:  Allergies as of 10/22/2017   No Known Allergies     Medication List        Accurate as of 10/22/17  3:55 PM. Always use your most recent med list.          amLODipine 5 MG tablet Commonly known as:  NORVASC Take 1 tablet (5 mg total) by mouth daily.   amoxicillin 875 MG tablet Commonly known as:  AMOXIL Take 1 tablet (875 mg total) by mouth every 12 (twelve) hours.   atorvastatin 20 MG tablet Commonly known as:  LIPITOR TAKE 1 TABLET BY MOUTH DAILY AT NIGHT   Cholecalciferol 50000 units capsule Take 1 capsule (50,000 Units total) by mouth once a week.   HYDROcodone-acetaminophen 5-325 MG tablet Commonly known as:  NORCO/VICODIN Take 1-2 tablets by mouth every 6 (six) hours as needed for moderate pain.   metFORMIN 500 MG tablet Commonly known as:  GLUCOPHAGE TAKE 1 TABLET BY MOUTH 2 TIMES DAILY WITH A MEAL.   oxybutynin 5  MG tablet Commonly known as:  DITROPAN 1 tab tid prn frequency,urgency, bladder spasm   sildenafil 20 MG tablet Commonly known as:  REVATIO 1-5 tabs 1 hour prior to intercourse   tamsulosin 0.4 MG Caps capsule Commonly known as:  FLOMAX Take 1 capsule (0.4 mg total) by mouth daily after breakfast.   telmisartan-hydrochlorothiazide 80-25 MG tablet Commonly known as:  MICARDIS HCT Take 1 tablet by mouth every morning.       Allergies: No Known Allergies  Family  History: Family History  Problem Relation Age of Onset  . Hypertension Mother   . Rheum arthritis Mother   . Alcohol abuse Brother   . Heart attack Sister   . Early death Sister        During childbirth  . Thyroid disease Sister   . Colon cancer Neg Hx   . Prostate cancer Neg Hx     Social History:  reports that he has never smoked. He has never used smokeless tobacco. He reports that he drinks alcohol. He reports that he does not use drugs.  ROS: UROLOGY Frequent Urination?: No Hard to postpone urination?: No Burning/pain with urination?: No Get up at night to urinate?: No Leakage of urine?: No Urine stream starts and stops?: No Trouble starting stream?: No Do you have to strain to urinate?: No Blood in urine?: No Urinary tract infection?: No Sexually transmitted disease?: No Injury to kidneys or bladder?: No Painful intercourse?: No Weak stream?: No Erection problems?: No Penile pain?: No  Gastrointestinal Nausea?: No Vomiting?: No Indigestion/heartburn?: No Diarrhea?: No Constipation?: No  Constitutional Fever: No Night sweats?: No Weight loss?: No Fatigue?: No  Skin Skin rash/lesions?: No Itching?: No  Eyes Blurred vision?: No Double vision?: No  Ears/Nose/Throat Sore throat?: No Sinus problems?: No  Hematologic/Lymphatic Swollen glands?: No Easy bruising?: No  Cardiovascular Leg swelling?: No Chest pain?: No  Respiratory Cough?: No Shortness of breath?: No  Endocrine Excessive thirst?: No  Musculoskeletal Back pain?: Yes Joint pain?: No  Neurological Headaches?: No Dizziness?: No  Psychologic Depression?: No Anxiety?: No  Physical Exam: BP (!) 150/84 (BP Location: Left Arm, Patient Position: Sitting, Cuff Size: Large)   Pulse 67   Ht 5' 9.5" (1.765 m)   Wt 200 lb 6.4 oz (90.9 kg)   BMI 29.17 kg/m   Constitutional:  Alert and oriented, No acute distress. HEENT: [place] AT, moist mucus membranes.  Trachea midline, no  masses. Cardiovascular: No clubbing, cyanosis, or edema. Respiratory: Normal respiratory effort, no increased work of breathing. GI: Abdomen is soft, nontender, nondistended, no abdominal masses GU: No CVA tenderness Lymph: No cervical or inguinal lymphadenopathy. Skin: No rashes, bruises or suspicious lesions. Neurologic: Grossly intact, no focal deficits, moving all 4 extremities. Psychiatric: Normal mood and affect.   Assessment & Plan:   57 year old male with bilateral nephrolithiasis.  I suspect his hydronephrosis reflex chronic nonobstructive dilation from his previous obstructing calculus.  He has dull left flank pressure.  Will schedule a stone protocol CT of the abdomen pelvis for further evaluation.  Urinalysis today was unremarkable.   [name], [place] [address], [place],  [address] [phone]

## 2017-10-23 LAB — URINALYSIS, COMPLETE
Bilirubin, UA: NEGATIVE
GLUCOSE, UA: NEGATIVE
Leukocytes, UA: NEGATIVE
NITRITE UA: NEGATIVE
SPEC GRAV UA: 1.02 (ref 1.005–1.030)
UUROB: 0.2 mg/dL (ref 0.2–1.0)
pH, UA: 6.5 (ref 5.0–7.5)

## 2017-10-23 LAB — MICROSCOPIC EXAMINATION
BACTERIA UA: NONE SEEN
Epithelial Cells (non renal): NONE SEEN /hpf (ref 0–10)
WBC UA: NONE SEEN /HPF (ref 0–5)

## 2017-10-31 ENCOUNTER — Encounter: Payer: Self-pay | Admitting: Urology

## 2017-11-20 ENCOUNTER — Other Ambulatory Visit: Payer: Self-pay | Admitting: Internal Medicine

## 2017-11-20 DIAGNOSIS — I10 Essential (primary) hypertension: Secondary | ICD-10-CM

## 2017-11-20 MED ORDER — AMLODIPINE BESYLATE 5 MG PO TABS: 5.0000 mg | ORAL_TABLET | ORAL | 3 refills | Status: DC

## 2017-12-12 ENCOUNTER — Ambulatory Visit
Admission: RE | Admit: 2017-12-12 | Discharge: 2017-12-12 | Disposition: A | Discharge status: Home / Self Care | Payer: 59 | Source: Ambulatory Visit | Attending: Urology | Admitting: Urology

## 2017-12-12 DIAGNOSIS — N200 Calculus of kidney: Secondary | ICD-10-CM

## 2017-12-12 DIAGNOSIS — N132 Hydronephrosis with renal and ureteral calculous obstruction: Secondary | ICD-10-CM | POA: Insufficient documentation

## 2017-12-15 ENCOUNTER — Telehealth: Payer: Self-pay

## 2017-12-15 NOTE — Telephone Encounter (Signed)
-----   Message from [name], MD sent at 12/14/2017  9:02 AM EST ----- CT shows bilateral lower pole calculi.  There is a small stone in the right ureter with mild to moderate obstruction.  No left ureteral calculi are seen.  Recommend follow-up appointment to discuss options.

## 2017-12-29 ENCOUNTER — Other Ambulatory Visit: Payer: Self-pay | Admitting: Family

## 2018-01-01 ENCOUNTER — Ambulatory Visit (INDEPENDENT_AMBULATORY_CARE_PROVIDER_SITE_OTHER): Payer: 59 | Admitting: Internal Medicine

## 2018-01-01 ENCOUNTER — Encounter: Payer: Self-pay | Admitting: Internal Medicine

## 2018-01-01 VITALS — BP 144/82 | HR 84 | Temp 98.1°F | Ht 69.95 in | Wt 198.8 lb

## 2018-01-01 DIAGNOSIS — Z125 Encounter for screening for malignant neoplasm of prostate: Secondary | ICD-10-CM | POA: Diagnosis not present

## 2018-01-01 DIAGNOSIS — Z1159 Encounter for screening for other viral diseases: Secondary | ICD-10-CM

## 2018-01-01 DIAGNOSIS — Z1389 Encounter for screening for other disorder: Secondary | ICD-10-CM

## 2018-01-01 DIAGNOSIS — N529 Male erectile dysfunction, unspecified: Secondary | ICD-10-CM

## 2018-01-01 DIAGNOSIS — F419 Anxiety disorder, unspecified: Secondary | ICD-10-CM | POA: Diagnosis not present

## 2018-01-01 DIAGNOSIS — N200 Calculus of kidney: Secondary | ICD-10-CM | POA: Diagnosis not present

## 2018-01-01 DIAGNOSIS — Z0000 Encounter for general adult medical examination without abnormal findings: Secondary | ICD-10-CM

## 2018-01-01 DIAGNOSIS — I10 Essential (primary) hypertension: Secondary | ICD-10-CM

## 2018-01-01 DIAGNOSIS — E559 Vitamin D deficiency, unspecified: Secondary | ICD-10-CM

## 2018-01-01 DIAGNOSIS — E119 Type 2 diabetes mellitus without complications: Secondary | ICD-10-CM

## 2018-01-01 DIAGNOSIS — Z0184 Encounter for antibody response examination: Secondary | ICD-10-CM

## 2018-01-01 HISTORY — DX: Encounter for general adult medical examination without abnormal findings: Z00.00

## 2018-01-01 MED ORDER — TELMISARTAN 80 MG PO TABS: 80.0000 mg | ORAL_TABLET | Freq: Every day | ORAL | 3 refills | Status: DC

## 2018-01-01 MED ORDER — CHLORTHALIDONE 25 MG PO TABS
25.0000 mg | ORAL_TABLET | Freq: Every day | ORAL | 3 refills | Status: DC
Start: 2018-01-01 — End: 2018-12-09

## 2018-01-01 NOTE — Progress Notes (Addendum)
Chief Complaint  Patient presents with  . Annual Exam   Annual with wife Verdis Frederickson  1. BP elevated on micardis hct 80-25 mg qd, norvasc 5 mg qd  2. C/o erectile dysfunction taking revatio 20 mg at times 3-4 pills qd prn f/u with urology Dr. [name] tomorrow advised him to disc and disc kidney stones + noted still on 12/12/17 CT renal though he has had 2 procedures for kidney stones on the right  3. Anxiety with coming to MD appts so they think this is why BP is elevated   Review of Systems  Constitutional: Negative for weight loss.  HENT: Negative for hearing loss.   Eyes: Negative for blurred vision.  Respiratory: Negative for shortness of breath.   Cardiovascular: Negative for chest pain.  Gastrointestinal: Negative for abdominal pain.  Genitourinary:       +ED  Musculoskeletal: Negative for falls.  Skin: Negative for rash.  Neurological: Negative for headaches.  Psychiatric/Behavioral: The patient is nervous/anxious.    Past Medical History:  Diagnosis Date  . Anxiety   . Diabetes mellitus without complication ([place])   . Difficult intubation   . Erectile dysfunction   . History of kidney stones   . Hyperlipidemia   . Hypertension   . Kidney stones   . Lipoma of extremity    Past Surgical History:  Procedure Laterality Date  . BRAIN SURGERY  1989   Cyst removed - benign  . CYSTOSCOPY W/ URETERAL STENT PLACEMENT Right 05/27/2017   Procedure: CYSTOSCOPY WITH STENT REPLACEMENT;  Surgeon: [name], MD;  Location: [facility];  Service: Urology;  Laterality: Right;  . CYSTOSCOPY/URETEROSCOPY/HOLMIUM LASER Right 05/27/2017   Procedure: CYSTOSCOPY/URETEROSCOPY/HOLMIUM LASER;  Surgeon: [name], MD;  Location: [facility];  Service: Urology;  Laterality: Right;  . CYSTOSCOPY/URETEROSCOPY/HOLMIUM LASER/STENT PLACEMENT Right 04/11/2017   Procedure: CYSTOSCOPY/URETEROSCOPY/HOLMIUM LASER/STENT PLACEMENT;  Surgeon: [name], MD;  Location: [facility];  Service: Urology;   Laterality: Right;  . INGUINAL HERNIA REPAIR    . MASS EXCISION Bilateral 04/22/2017   Procedure: EXCISION OF 3  MASSES OF PERIRECTAL, INNER THIGH AREAS;  Surgeon: Johnathan Hausen, MD;  Location: [facility];  Service: General;  Laterality: Bilateral;  LMA  . ROTATOR CUFF REPAIR Right 2013   Dr. Rudene Christians   . ROTATOR CUFF REPAIR Right 2014   Dr. Mack Guise  . VENTRAL HERNIA REPAIR     Family History  Problem Relation Age of Onset  . Hypertension Mother   . Rheum arthritis Mother   . Alcohol abuse Brother   . Heart attack Sister   . Early death Sister        During childbirth  . Thyroid disease Sister   . Colon cancer Neg Hx   . Prostate cancer Neg Hx    Social History   Socioeconomic History  . Marital status: Married    Spouse name: Not on file  . Number of children: 2  . Years of education: Not on file  . Highest education level: Not on file  Occupational History  . Occupation: OR at [employer]  . Financial resource strain: Not on file  . Food insecurity:    Worry: Not on file    Inability: Not on file  . Transportation needs:    Medical: Not on file    Non-medical: Not on file  Tobacco Use  . Smoking status: Never Smoker  . Smokeless tobacco: Never Used  Substance and Sexual Activity  .  Alcohol use: Yes    Alcohol/week: 0.0 standard drinks    Comment: Occasional  . Drug use: No  . Sexual activity: Yes    Comment: wife   Lifestyle  . Physical activity:    Days per week: Not on file    Minutes per session: Not on file  . Stress: Not on file  Relationships  . Social connections:    Talks on phone: Not on file    Gets together: Not on file    Attends religious service: Not on file    Active member of club or organization: Not on file    Attends meetings of clubs or organizations: Not on file    Relationship status: Not on file  . Intimate partner violence:    Fear of current or ex partner: Not on file    Emotionally abused: Not on  file    Physically abused: Not on file    Forced sexual activity: Not on file  Other Topics Concern  . Not on file  Social History Narrative   Lives in [place] with wife. Dog. Hobbies - Football.      3 grandchildren, 2 children, married to wife x 45 years as of 01/10/17    [facility]      Regular Exercise -  NO   Daily Caffeine Use:  4 cups coffee in AM, 1 cup tea in AM         No outpatient medications have been marked as taking for the 01/01/18 encounter (Office Visit) with McLean-Scocuzza, Nino Glow, MD.   No Known Allergies Recent Results (from the past 2160 hour(s))  Urinalysis, Complete     Status: Abnormal   Collection Time: 10/22/17  3:17 PM  Result Value Ref Range   Specific Gravity, UA 1.020 1.005 - 1.030   pH, UA 6.5 5.0 - 7.5   Color, UA Yellow Yellow   Appearance Ur Clear Clear   Leukocytes, UA Negative Negative   Protein, UA 1+ (A) Negative/Trace   Glucose, UA Negative Negative   Ketones, UA Trace (A) Negative   RBC, UA Trace (A) Negative   Bilirubin, UA Negative Negative   Urobilinogen, Ur 0.2 0.2 - 1.0 mg/dL   Nitrite, UA Negative Negative   Microscopic Examination See below:   Microscopic Examination     Status: Abnormal   Collection Time: 10/22/17  3:17 PM  Result Value Ref Range   WBC, UA None seen 0 - 5 /hpf   RBC, UA 0-2 0 - 2 /hpf   Epithelial Cells (non renal) None seen 0 - 10 /hpf   Mucus, UA Present (A) Not Estab.   Bacteria, UA None seen None seen/Few   Objective  Body mass index is 28.57 kg/m. Wt Readings from Last 3 Encounters:  01/01/18 198 lb 12.8 oz (90.2 kg)  10/22/17 200 lb 6.4 oz (90.9 kg)  07/08/17 197 lb 6.4 oz (89.5 kg)   Temp Readings from Last 3 Encounters:  01/01/18 98.1 F (36.7 C) (Oral)  05/27/17 (!) 96.4 F (35.8 C) (Temporal)  04/22/17 97.6 F (36.4 C)   BP Readings from Last 3 Encounters:  01/01/18 (!) 144/82  10/22/17 (!) 150/84  07/08/17 (!) 146/83   Pulse Readings from Last 3 Encounters:   01/01/18 84  10/22/17 67  07/08/17 66    Physical Exam  Constitutional: He is oriented to person, place, and time. Vital signs are normal. He appears well-developed and well-nourished. He is cooperative.  HENT:  Head: Normocephalic and atraumatic.  Mouth/Throat: Oropharynx is clear and moist and mucous membranes are normal.  Eyes: Pupils are equal, round, and reactive to light. Conjunctivae are normal.  Cardiovascular: Normal rate, regular rhythm and normal heart sounds.  Pulmonary/Chest: Effort normal and breath sounds normal.  Neurological: He is alert and oriented to person, place, and time. Gait normal.  Skin: Skin is warm, dry and intact.  Psychiatric: He has a normal mood and affect. His speech is normal and behavior is normal. Judgment and thought content normal. Cognition and memory are normal.  Nursing note and vitals reviewed.   Assessment   1. Annual  2. HTN  3. Erectile dysfunction and kidney stones 4. Anxiety  Plan  1.  Had flu shot 10/09/17  Tdap utd  pna 23 UTD  shingrix 1/2  Hep B immune hep C neg  Check fasting labs 01/12/18   UTD colonoscopy UTD PSA recheck upcoming labs rec healthy diet choices and exercise  2.  D/c telmisartan-hct 80-25 change to telmisartan 80 mg qd add chlorthalidone 25 mg qd  norvasc 5 mg qd  Log Blood pressure  3. Disc with Dr. [name] tomorrow cialis worked in the past revatio max 80 mg is not helping with erection  4. Consider prn benzo in future to see if helps   07/08/18 right proximal ureteral stricture, right renal renal and ureteral kidneys, elevated PSA, ED given Keflex 500 mg tid wants to transfer care to Dr. [name] f/u in 3 months with CT scan and lasix renal scan   [facility] Dr. [name] 10/09/18 right renal and ureteral stone, left renal stone, elevated PSA, ED   Reduced renal function and ongoing obstruction rec cystoscopy, right retrograde pyelography, right ureteroscopy with possible laser lithotripsy and  possible stent  Address right stone 1st then left   ED given Sildenafil   Elevated PSA f/u spring 2021  Of note BP was 161/88 10/09/18   12/08/18 right renal and ureteral calculi, left renal calculi, h/o elevated PSA, ED right ureteral stricture Dr. [name]  -K citrate 20 meq bid no kidney stones on right some on left  F/u in 3 months and repeat renal scan and renal US  Sildenafil prn   Provider: Dr. Olivia Mackie McLean-Scocuzza-Internal Medicine

## 2018-01-01 NOTE — Patient Instructions (Addendum)
Stop Telmisartan hydrochlorothiazide and continue Telmisartan only with Chlorthalidone  Try blood pressure medications at night   Talk to Dr. [name] about erection issues maybe ask about Cialis   Erectile Dysfunction Erectile dysfunction (ED) is the inability to get or keep an erection in order to have sexual intercourse. Erectile dysfunction may include:  Inability to get an erection.  Lack of enough hardness of the erection to allow penetration.  Loss of the erection before sex is finished.  What are the causes? This condition may be caused by:  Certain medicines, such as: ? Pain relievers. ? Antihistamines. ? Antidepressants. ? Blood pressure medicines. ? Water pills (diuretics). ? Ulcer medicines. ? Muscle relaxants. ? Drugs.  Excessive drinking.  Psychological causes, such as: ? Anxiety. ? Depression. ? Sadness. ? Exhaustion. ? Performance fear. ? Stress.  Physical causes, such as: ? Artery problems. This may include diabetes, smoking, liver disease, or atherosclerosis. ? High blood pressure. ? Hormonal problems, such as low testosterone. ? Obesity. ? Nerve problems. This may include back or pelvic injuries, diabetes mellitus, multiple sclerosis, or Parkinson disease.  What are the signs or symptoms? Symptoms of this condition include:  Inability to get an erection.  Lack of enough hardness of the erection to allow penetration.  Loss of the erection before sex is finished.  Normal erections at some times, but with frequent unsatisfactory episodes.  Low sexual satisfaction in either partner due to erection problems.  A curved penis occurring with erection. The curve may cause pain or the penis may be too curved to allow for intercourse.  Never having nighttime erections.  How is this diagnosed? This condition is often diagnosed by:  Performing a physical exam to find other diseases or specific problems with the penis.  Asking you detailed  questions about the problem.  Performing blood tests to check for diabetes mellitus or to measure hormone levels.  Performing other tests to check for underlying health conditions.  Performing an ultrasound exam to check for scarring.  Performing a test to check blood flow to the penis.  Doing a sleep study at home to measure nighttime erections.  How is this treated? This condition may be treated by:  Medicine taken by mouth to help you achieve an erection (oral medicine).  Hormone replacement therapy to replace low testosterone levels.  Medicine that is injected into the penis. Your health care provider may instruct you how to give yourself these injections at home.  Vacuum pump. This is a pump with a ring on it. The pump and ring are placed on the penis and used to create pressure that helps the penis become erect.  Penile implant surgery. In this procedure, you may receive: ? An inflatable implant. This consists of cylinders, a pump, and a reservoir. The cylinders can be inflated with a fluid that helps to create an erection, and they can be deflated after intercourse. ? A semi-rigid implant. This consists of two silicone rubber rods. The rods provide some rigidity. They are also flexible, so the penis can both curve downward in its normal position and become straight for sexual intercourse.  Blood vessel surgery, to improve blood flow to the penis. During this procedure, a blood vessel from a different part of the body is placed into the penis to allow blood to flow around (bypass) damaged or blocked blood vessels.  Lifestyle changes, such as exercising more, losing weight, and quitting smoking.  Follow these instructions at home: Medicines  Take over-the-counter and prescription  medicines only as told by your health care provider. Do not increase the dosage without first discussing it with your health care provider.  If you are using self-injections, perform injections as  directed by your health care provider. Make sure to avoid any veins that are on the surface of the penis. After giving an injection, apply pressure to the injection site for 5 minutes. General instructions  Exercise regularly, as directed by your health care provider. Work with your health care provider to lose weight, if needed.  Do not use any products that contain nicotine or tobacco, such as cigarettes and e-cigarettes. If you need help quitting, ask your health care provider.  Before using a vacuum pump, read the instructions that come with the pump and discuss any questions with your health care provider.  Keep all follow-up visits as told by your health care provider. This is important. Contact a health care provider if:  You feel nauseous.  You vomit. Get help right away if:  You are taking oral or injectable medicines and you have an erection that lasts longer than 4 hours. If your health care provider is unavailable, go to the nearest emergency room for evaluation. An erection that lasts much longer than 4 hours can result in permanent damage to your penis.  You have severe pain in your groin or abdomen.  You develop redness or severe swelling of your penis.  You have redness spreading up into your groin or lower abdomen.  You are unable to urinate.  You experience chest pain or a rapid heart beat (palpitations) after taking oral medicines. Summary  Erectile dysfunction (ED) is the inability to get or keep an erection during sexual intercourse. This problem can usually be treated successfully.  This condition is diagnosed based on a physical exam, your symptoms, and tests to determine the cause. Treatment varies depending on the cause, and may include medicines, hormone therapy, surgery, or vacuum pump.  You may need follow-up visits to make sure that you are using your medicines or devices correctly.  Get help right away if you are taking or injecting medicines and you  have an erection that lasts longer than 4 hours. This information is not intended to replace advice given to you by your health care provider. Make sure you discuss any questions you have with your health care provider. Document Released: 01/12/2000 Document Revised: 01/31/2016 Document Reviewed: 01/31/2016 Elsevier Interactive Patient Education  2017 [employer].  Chlorthalidone tablets What is this medicine? CHLORTHALIDONE (klor THAL i done) is a diuretic. It increases the amount of urine passed, which causes the body to lose salt and water. This medicine is used to treat high blood pressure and edema or water retention. This medicine may be used for other purposes; ask your health care provider or pharmacist if you have questions. COMMON BRAND NAME(S): Thalitone What should I tell my health care provider before I take this medicine? They need to know if you have any of these conditions: -asthma -diabetes -gout -kidney disease -liver disease -parathyroid disease -systemic lupus erythematosus (SLE) -taking cortisone, digoxin, lithium carbonate, or drugs for diabetes -an unusual or allergic reaction to chlorthalidone, sulfa drugs, other medicines, foods, dyes, or preservatives -pregnant or trying to get pregnant -breast-feeding How should I use this medicine? Take this medicine by mouth with a glass of water. Follow the directions on the prescription label. It is best to take your dose in the morning with food. Take your medicine at regular intervals. Do not  take your medicine more often than directed. Do not stop taking except on your doctor's advice. Talk to your pediatrician regarding the use of this medicine in children. Special care may be needed. Overdosage: If you think you have taken too much of this medicine contact a poison control center or emergency room at once. NOTE: This medicine is only for you. Do not share this medicine with others. What if I miss a dose? If you miss  a dose, take it as soon as you can. If it is almost time for your next dose, take only that dose. Do not take double or extra doses. What may interact with this medicine? -barbiturate medicines for sleep or seizure control -digoxin -lithium -medicines for diabetes -norepinephrine -other medicines for high blood pressure -some pain medicines -steroid hormones like prednisone, cortisone, hydrocortisone, corticotropin -tubocurarine This list may not describe all possible interactions. Give your health care provider a list of all the medicines, herbs, non-prescription drugs, or dietary supplements you use. Also tell them if you smoke, drink alcohol, or use illegal drugs. Some items may interact with your medicine. What should I watch for while using this medicine? Visit your doctor or health care professional for regular check ups. Check your blood pressure as directed. Ask your doctor or health care professional what your blood pressure should be and when you should contact him or her. You may need to be on a special diet while taking this medicine. Ask your doctor. You may get drowsy or dizzy. Do not drive, use machinery, or do anything that needs mental alertness until you know how this medicine affects you. Do not stand or sit up quickly, especially if you are an older patient. This reduces the risk of dizzy or fainting spells. Alcohol may interfere with the effect of this medicine. Avoid alcoholic drinks. This medicine may affect your blood sugar level. If you have diabetes, check with your doctor or health care professional before changing the dose of your diabetic medicine. This medicine can make you more sensitive to the sun. Keep out of the sun. If you cannot avoid being in the sun, wear protective clothing and use sunscreen. Do not use sun lamps or tanning beds/booths. What side effects may I notice from receiving this medicine? Side effects that you should report to your doctor or health  care professional as soon as possible: -allergic reactions like skin rash, itching or hives, swelling of the face, lips, or tongue -dark urine -dry mouth -excess thirst -fast, irregular heart rate -fever, chills -muscle pain, cramps, or spasm -nausea, vomiting -redness, blistering, peeling or loosening of the skin, including inside the mouth -tingling, pain or numbness in the hands or feet -unusually weak or tired -yellowing of the eyes or skin Side effects that usually do not require medical attention (report to your doctor or health care professional if they continue or are bothersome): -diarrhea or constipation -headache -impotence -loss of appetite -stomach upset This list may not describe all possible side effects. Call your doctor for medical advice about side effects. You may report side effects to FDA at 1-800-FDA-1088. Where should I keep my medicine? Keep out of the reach of children. Store at room temperature between 15 and 30 degrees C (59 and 86 degrees F). Keep container tightly closed. Throw away any unused medicine after the expiration date. NOTE: This sheet is a summary. It may not cover all possible information. If you have questions about this medicine, talk to your doctor, pharmacist, or health  care provider.  2018 Elsevier/Gold Standard (2007-04-21 15:28:48)

## 2018-01-01 NOTE — Progress Notes (Signed)
Pre visit review using our clinic review tool, if applicable. No additional management support is needed unless otherwise documented below in the visit note. 

## 2018-01-02 ENCOUNTER — Encounter: Payer: Self-pay | Admitting: Urology

## 2018-01-02 ENCOUNTER — Telehealth: Payer: Self-pay | Admitting: Urology

## 2018-01-02 ENCOUNTER — Other Ambulatory Visit: Payer: Self-pay

## 2018-01-02 ENCOUNTER — Ambulatory Visit (INDEPENDENT_AMBULATORY_CARE_PROVIDER_SITE_OTHER): Payer: 59 | Admitting: Urology

## 2018-01-02 VITALS — BP 144/82 | HR 89 | Ht 70.0 in | Wt 197.5 lb

## 2018-01-02 DIAGNOSIS — N132 Hydronephrosis with renal and ureteral calculous obstruction: Secondary | ICD-10-CM | POA: Diagnosis not present

## 2018-01-02 DIAGNOSIS — N201 Calculus of ureter: Secondary | ICD-10-CM | POA: Diagnosis not present

## 2018-01-02 DIAGNOSIS — N200 Calculus of kidney: Secondary | ICD-10-CM

## 2018-01-02 MED ORDER — TADALAFIL 20 MG PO TABS: ORAL_TABLET | ORAL | 1 refills | Status: DC

## 2018-01-02 NOTE — Telephone Encounter (Signed)
Pt would like RX from today's visit sent to [facility] in [place].  Just F.Y.I.

## 2018-01-02 NOTE — Progress Notes (Signed)
01/02/2018  10:17 AM   Andrew Stevens February 26, 1960 [id number]  Referring provider: McLean-Scocuzza, Nino Glow, MD [place], [place] [address]  Chief Complaint  Patient presents with  . Follow-up   Urologic history: 1.  Bilateral nephrolithiasis -Right ureteroscopy 3/19 for a 10 x 19 mm impacted proximal ureteral calculus; access proximal to the calculus unable to be achieved and required in situ semirigid ureteroscopy for guidewire placement; access with a flexible scope unsuccessful and stent placement required for a staged procedure -Follow-up flexible ureteroscopy 4/30 -Elected observation left nephrolithiasis   HPI: Andrew Stevens is a 57 y.o. Male, with his wife, to discuss his options following recent CT on 12/02/2017.   He was having intermittent left flank pain. Denies dysuria, gross hematuria or flank/abdominal/pelvic/scrotal pain.   His CT showed mild hydronephrosis and dilation of the proximal ureter.  There was a 3 mm proximal ureteral calculus and a cluster of lower pole calculi.  Nonobstructing left renal calculi noted the largest 13 mm in a lower pole infundibulum.  He also wanted discuss his ED treatment.  He was previously on tadalafil with good results.  His insurance would not cover and he was subsequently switched to sildenafil 20 mg which he states is not as effective at 60-100 mg.   PMH: Past Medical History:  Diagnosis Date  . Anxiety   . Diabetes mellitus without complication ([place])   . Difficult intubation   . Erectile dysfunction   . History of kidney stones   . Hyperlipidemia   . Hypertension   . Kidney stones   . Lipoma of extremity    Surgical History: Past Surgical History:  Procedure Laterality Date  . BRAIN SURGERY  1989   Cyst removed - benign  . CYSTOSCOPY W/ URETERAL STENT PLACEMENT Right 05/27/2017   Procedure: CYSTOSCOPY WITH STENT REPLACEMENT;  Surgeon: [name], MD;  Location: [facility];  Service: Urology;   Laterality: Right;  . CYSTOSCOPY/URETEROSCOPY/HOLMIUM LASER Right 05/27/2017   Procedure: CYSTOSCOPY/URETEROSCOPY/HOLMIUM LASER;  Surgeon: [name], MD;  Location: [facility];  Service: Urology;  Laterality: Right;  . CYSTOSCOPY/URETEROSCOPY/HOLMIUM LASER/STENT PLACEMENT Right 04/11/2017   Procedure: CYSTOSCOPY/URETEROSCOPY/HOLMIUM LASER/STENT PLACEMENT;  Surgeon: [name], MD;  Location: [facility];  Service: Urology;  Laterality: Right;  . INGUINAL HERNIA REPAIR    . MASS EXCISION Bilateral 04/22/2017   Procedure: EXCISION OF 3  MASSES OF PERIRECTAL, INNER THIGH AREAS;  Surgeon: Johnathan Hausen, MD;  Location: [facility];  Service: General;  Laterality: Bilateral;  LMA  . ROTATOR CUFF REPAIR Right 2013   Dr. Rudene Christians   . ROTATOR CUFF REPAIR Right 2014   Dr. Mack Guise  . VENTRAL HERNIA REPAIR     Home Medications:  Allergies as of 01/02/2018   No Known Allergies     Medication List        Accurate as of 01/02/18 10:17 AM. Always use your most recent med list.          amLODipine 5 MG tablet Commonly known as:  NORVASC Take 1 tablet (5 mg total) by mouth every morning.   atorvastatin 20 MG tablet Commonly known as:  LIPITOR TAKE 1 TABLET BY MOUTH DAILY AT NIGHT   chlorthalidone 25 MG tablet Commonly known as:  HYGROTON Take 1 tablet (25 mg total) by mouth daily.   metFORMIN 500 MG tablet Commonly known as:  GLUCOPHAGE TAKE 1 TABLET BY MOUTH 2 TIMES DAILY WITH A MEAL.   sildenafil 20 MG tablet  Commonly known as:  REVATIO 1-5 tabs 1 hour prior to intercourse   telmisartan 80 MG tablet Commonly known as:  MICARDIS Take 1 tablet (80 mg total) by mouth daily.      Allergies: No Known Allergies  Family History: Family History  Problem Relation Age of Onset  . Hypertension Mother   . Rheum arthritis Mother   . Alcohol abuse Brother   . Heart attack Sister   . Early death Sister        During childbirth  . Thyroid disease Sister   . Colon  cancer Neg Hx   . Prostate cancer Neg Hx    Social History:  reports that he has never smoked. He has never used smokeless tobacco. He reports that he drinks alcohol. He reports that he does not use drugs.  ROS: UROLOGY Frequent Urination?: No Hard to postpone urination?: No Burning/pain with urination?: No Get up at night to urinate?: No Leakage of urine?: No Urine stream starts and stops?: No Trouble starting stream?: No Do you have to strain to urinate?: No Blood in urine?: No Urinary tract infection?: No Sexually transmitted disease?: No Injury to kidneys or bladder?: No Painful intercourse?: No Weak stream?: No Erection problems?: No Penile pain?: No  Gastrointestinal Nausea?: No Vomiting?: No Indigestion/heartburn?: No Diarrhea?: No Constipation?: No  Constitutional Fever: No Night sweats?: No Weight loss?: No Fatigue?: No  Skin Skin rash/lesions?: No Itching?: No  Eyes Blurred vision?: No Double vision?: No  Ears/Nose/Throat Sore throat?: No Sinus problems?: No  Hematologic/Lymphatic Swollen glands?: No Easy bruising?: No  Cardiovascular Leg swelling?: No Chest pain?: No  Respiratory Cough?: No Shortness of breath?: No  Endocrine Excessive thirst?: No  Musculoskeletal Back pain?: No Joint pain?: No  Neurological Headaches?: No Dizziness?: No  Psychologic Depression?: No Anxiety?: No  Physical Exam: BP (!) 144/82   Pulse 89   Ht 5\' 10"  (1.778 m)   Wt 197 lb 8 oz (89.6 kg)   BMI 28.34 kg/m   Constitutional:  Alert and oriented, No acute distress.Marland Kitchen Respiratory: Normal respiratory effort, no increased work of breathing. Lungs clear CV:RRR Head: Normocephalic and atraumatic. Skin: No rashes, bruises or suspicious lesions. Neurologic: Grossly intact, no focal deficits, moving all 4 extremities. Psychiatric: Normal mood and affect.  Pertinent Imaging:  Results for orders placed during the hospital encounter of 12/12/17  CT  RENAL STONE STUDY   Narrative CLINICAL DATA:  Left flank pain for 2-3 months.  EXAM: CT ABDOMEN AND PELVIS WITHOUT CONTRAST  TECHNIQUE: Multidetector CT imaging of the abdomen and pelvis was performed following the standard protocol without IV contrast.  COMPARISON:  09/11/2009  FINDINGS: Lower chest: The lung bases are clear of acute process. No pleural effusion or pulmonary lesions. The heart is normal in size. No pericardial effusion. The distal esophagus and aorta are unremarkable.  Hepatobiliary: No focal hepatic lesions or intrahepatic biliary dilatation. The gallbladder is normal. No common bile duct dilatation.  Pancreas: No mass, inflammation or ductal dilatation.  Spleen: Normal size.  No focal lesions.  Adrenals/Urinary Tract: The adrenal glands are normal.  Right kidney: Significant scarring changes, atrophy and mild to moderate hydronephrosis. Cluster of lower pole calculi are noted with the largest calculus measuring up to 12 mm. There is also a 3 mm calculus in the proximal right ureter just below the UPJ and at the level of the L3 vertebral body.  Left kidney: Normal renal cortical thickness without scarring changes. Numerous renal calculi are demonstrated with the  largest calculus in the lower pole region measuring 13 mm. No hydronephrosis or left-sided ureteral calculi. No worrisome renal lesions.  No bladder mass or calculi.  No asymmetric bladder wall thickening.  Stomach/Bowel: The stomach, duodenum, small bowel and colon are grossly normal without oral contrast. No inflammatory changes, mass lesions or obstructive findings. The terminal ileum and appendix are normal.  Vascular/Lymphatic: Scattered atherosclerotic calcifications involving the distal aorta and iliac arteries. No aneurysm. No mesenteric or retroperitoneal mass or adenopathy. Small scattered lymph nodes are noted.  Reproductive: Mild prostate gland enlargement. The seminal  vesicles appear normal.  Other: No inguinal mass, adenopathy or hernia.  Musculoskeletal: No significant bony findings.  IMPRESSION: 1. Bilateral renal calculi. 2. 3 mm upper right ureteral calculus with mild to moderate grade hydroureteronephrosis. No left-sided ureteral calculi. 3. Small scarred right kidney. 4. Normal bladder without contrast. 5. No acute abdominal/pelvic findings, mass lesions or adenopathy.   Electronically Signed   By: Marijo Sanes M.D.   On: 12/12/2017 11:39    I have personally reviewed imaging today.  Assessment & Plan:    1.  Nephrolithiasis He has a right proximal ureteral calculus and with prior history of a large impacted proximal ureteral calculus have recommended further evaluation with ureteroscopy to evaluate for possible ureteral stricture, laser lithotripsy and an attempt at clearing his right side.  He would like to have this done if possible before the first of the year.  I do not have any available OR time and he was agreeable for Dr. Diamantina Providence to perform the procedure on 12/27.  The indications and nature of the planned procedure were discussed as well as the potential  benefits and expected outcome.  Alternatives have been discussed in detail. The most common complications and side effects were discussed including but not limited to infection/sepsis; blood loss; damage to urethra, bladder, ureter, kidney; need for multiple surgeries; need for prolonged stent placement as well as general anesthesia risks. The possibility that the calculus may not be able to be treated due to inability to obtain access to the upper ureter was also discussed. In that event he would require stent placement and a follow-up procedure after a period of stent dilation. All of his questions were answered and he desires to proceed.   He has nonobstructing left renal calculi with a fairly significant stone burden.  We also discussed management options including ureteroscopy,  PCNL, shockwave lithotripsy and observation we will discuss further after treatment of his contralateral side.   2. Erectile dysfunction Rx tadalafil was sent to a local pharmacy and he was informed there is reasonable pricing with a good Rx coupon.   [name], MD   [place] [address] [place], [place] [address] [phone]   I, Stephania Fragmin , am acting as a scribe for [employer], MD  I, [name], MD, have reviewed all documentation for this visit. The documentation on 01/04/18 for the exam, diagnosis, procedures, and orders are all accurate and complete.

## 2018-01-04 ENCOUNTER — Encounter: Payer: Self-pay | Admitting: Urology

## 2018-01-07 ENCOUNTER — Other Ambulatory Visit: Payer: Self-pay | Admitting: Radiology

## 2018-01-07 DIAGNOSIS — N200 Calculus of kidney: Secondary | ICD-10-CM

## 2018-01-08 ENCOUNTER — Other Ambulatory Visit: Payer: Self-pay | Admitting: Radiology

## 2018-01-12 ENCOUNTER — Telehealth: Payer: Self-pay | Admitting: Radiology

## 2018-01-13 ENCOUNTER — Encounter
Admission: RE | Admit: 2018-01-13 | Discharge: 2018-01-13 | Disposition: A | Discharge status: Home / Self Care | Payer: 59 | Source: Ambulatory Visit | Attending: Urology | Admitting: Urology

## 2018-01-13 ENCOUNTER — Other Ambulatory Visit: Payer: Self-pay

## 2018-01-13 DIAGNOSIS — I10 Essential (primary) hypertension: Secondary | ICD-10-CM | POA: Diagnosis not present

## 2018-01-13 DIAGNOSIS — Z01818 Encounter for other preprocedural examination: Secondary | ICD-10-CM | POA: Insufficient documentation

## 2018-01-13 DIAGNOSIS — E119 Type 2 diabetes mellitus without complications: Secondary | ICD-10-CM | POA: Diagnosis not present

## 2018-01-13 NOTE — Telephone Encounter (Signed)
LMOM with date of surgery and date of preop where pt can expect to have a ucx done at the time of preop.

## 2018-01-13 NOTE — Patient Instructions (Signed)
Your procedure is scheduled on: Fri. 12/27 Report to Day Surgery. To find out your arrival time please call [phone] between 1PM - 3PM on Thurs. 12/26.  Remember: Instructions that are not followed completely may result in serious medical risk,  up to and including death, or upon the discretion of your surgeon and anesthesiologist your  surgery may need to be rescheduled.     _X__ 1. Do not eat food after midnight the night before your procedure.                 No gum chewing or hard candies. You may drink clear liquids up to 2 hours                 before you are scheduled to arrive for your surgery- DO not drink clear                 liquids within 2 hours of the start of your surgery.                 Clear Liquids include:  water, apple juice without pulp, clear carbohydrate                 drink such as Clearfast of Gatorade, Black Coffee or Tea (Do not add                 anything to coffee or tea).  __X__2.  On the morning of surgery brush your teeth with toothpaste and water, you                may rinse your mouth with mouthwash if you wish.  Do not swallow any toothpaste of mouthwash.     _X__ 3.  No Alcohol for 24 hours before or after surgery.   ___ 4.  Do Not Smoke or use e-cigarettes For 24 Hours Prior to Your Surgery.                 Do not use any chewable tobacco products for at least 6 hours prior to                 surgery.  ____  5.  Bring all medications with you on the day of surgery if instructed.   __x__  6.  Notify your doctor if there is any change in your medical condition      (cold, fever, infections).     Do not wear jewelry, make-up, hairpins, clips or nail polish. Do not wear lotions, powders, or perfumes. You may wear deodorant. Do not shave 48 hours prior to surgery. Men may shave face and neck. Do not bring valuables to the hospital.    [facility] is not responsible for any belongings or valuables.  Contacts,  dentures or bridgework may not be worn into surgery. Leave your suitcase in the car. After surgery it may be brought to your room. For patients admitted to the hospital, discharge time is determined by your treatment team.   Patients discharged the day of surgery will not be allowed to drive home.   Please read over the following fact sheets that you were given:    __x__ Take these medicines the morning of surgery with A SIP OF WATER:    1. amLODipine (NORVASC) 5 MG tablet  2.   3.   4.  5.  6.  ____ Fleet Enema (as directed)   ____ Use CHG Soap as directed  ____ Use inhalers  on the day of surgery  __x__ Stop metformin 2 days prior to surgery Last dose on 12/24   ____ Take 1/2 of usual insulin dose the night before surgery. No insulin the morning          of surgery.   ____ Stop Coumadin/Plavix/aspirin on   __x__ Stop Anti-inflammatories Aleve or ibuprofen on 12/20   ____ Stop supplements until after surgery.    ____ Bring C-Pap to the hospital.

## 2018-01-15 LAB — URINE CULTURE: Culture: NO GROWTH

## 2018-01-16 ENCOUNTER — Other Ambulatory Visit (INDEPENDENT_AMBULATORY_CARE_PROVIDER_SITE_OTHER): Payer: 59

## 2018-01-16 DIAGNOSIS — E559 Vitamin D deficiency, unspecified: Secondary | ICD-10-CM

## 2018-01-16 DIAGNOSIS — Z1389 Encounter for screening for other disorder: Secondary | ICD-10-CM | POA: Diagnosis not present

## 2018-01-16 DIAGNOSIS — Z1159 Encounter for screening for other viral diseases: Secondary | ICD-10-CM

## 2018-01-16 DIAGNOSIS — Z0184 Encounter for antibody response examination: Secondary | ICD-10-CM | POA: Diagnosis not present

## 2018-01-16 DIAGNOSIS — Z125 Encounter for screening for malignant neoplasm of prostate: Secondary | ICD-10-CM

## 2018-01-16 DIAGNOSIS — I10 Essential (primary) hypertension: Secondary | ICD-10-CM | POA: Diagnosis not present

## 2018-01-16 DIAGNOSIS — E119 Type 2 diabetes mellitus without complications: Secondary | ICD-10-CM

## 2018-01-16 LAB — CBC WITH DIFFERENTIAL/PLATELET
Basophils Absolute: 0.1 10*3/uL (ref 0.0–0.1)
Basophils Relative: 0.8 % (ref 0.0–3.0)
Eosinophils Absolute: 0.1 10*3/uL (ref 0.0–0.7)
Eosinophils Relative: 1.8 % (ref 0.0–5.0)
HCT: 42.7 % (ref 39.0–52.0)
Hemoglobin: 14.6 g/dL (ref 13.0–17.0)
Lymphocytes Relative: 16.2 % (ref 12.0–46.0)
Lymphs Abs: 1.2 10*3/uL (ref 0.7–4.0)
MCHC: 34.2 g/dL (ref 30.0–36.0)
MCV: 87.4 fl (ref 78.0–100.0)
MONO ABS: 0.4 10*3/uL (ref 0.1–1.0)
Monocytes Relative: 5.2 % (ref 3.0–12.0)
Neutro Abs: 5.4 10*3/uL (ref 1.4–7.7)
Neutrophils Relative %: 76 % (ref 43.0–77.0)
Platelets: 269 10*3/uL (ref 150.0–400.0)
RBC: 4.89 Mil/uL (ref 4.22–5.81)
RDW: 13.2 % (ref 11.5–15.5)
WBC: 7.2 10*3/uL (ref 4.0–10.5)

## 2018-01-16 LAB — TSH: TSH: 1.61 u[IU]/mL (ref 0.35–4.50)

## 2018-01-16 LAB — COMPREHENSIVE METABOLIC PANEL
ALT: 15 U/L (ref 0–53)
AST: 18 U/L (ref 0–37)
Albumin: 4.5 g/dL (ref 3.5–5.2)
Alkaline Phosphatase: 56 U/L (ref 39–117)
BUN: 20 mg/dL (ref 6–23)
CO2: 31 mEq/L (ref 19–32)
Calcium: 9.4 mg/dL (ref 8.4–10.5)
Chloride: 100 mEq/L (ref 96–112)
Creatinine, Ser: 1.21 mg/dL (ref 0.40–1.50)
GFR: 65.19 mL/min (ref >60.00)
GLUCOSE: 127 mg/dL — AB (ref 70–99)
Potassium: 3.9 mEq/L (ref 3.5–5.1)
SODIUM: 139 meq/L (ref 135–145)
Total Bilirubin: 0.9 mg/dL (ref 0.2–1.2)
Total Protein: 7.3 g/dL (ref 6.0–8.3)

## 2018-01-16 LAB — LIPID PANEL
Cholesterol: 111 mg/dL (ref 0–200)
HDL: 45.4 mg/dL (ref >39.00)
LDL Cholesterol: 55 mg/dL (ref 0–99)
NonHDL: 65.61
Total CHOL/HDL Ratio: 2
Triglycerides: 51 mg/dL (ref 0.0–149.0)
VLDL: 10.2 mg/dL (ref 0.0–40.0)

## 2018-01-16 LAB — T4, FREE: Free T4: 0.79 ng/dL (ref 0.60–1.60)

## 2018-01-16 LAB — HEMOGLOBIN A1C: Hgb A1c MFr Bld: 6.3 % (ref 4.6–6.5)

## 2018-01-16 LAB — VITAMIN D 25 HYDROXY (VIT D DEFICIENCY, FRACTURES): VITD: 38.41 ng/mL (ref 30.00–100.00)

## 2018-01-16 LAB — PSA: PSA: 1.61 ng/mL (ref 0.10–4.00)

## 2018-01-16 NOTE — Addendum Note (Signed)
Addended by: Arby Barrette on: 01/16/2018 09:51 AM   Modules accepted: Orders

## 2018-01-17 LAB — URINALYSIS, ROUTINE W REFLEX MICROSCOPIC
Bilirubin, UA: NEGATIVE
GLUCOSE, UA: NEGATIVE
Ketones, UA: NEGATIVE
Leukocytes, UA: NEGATIVE
Nitrite, UA: NEGATIVE
Protein, UA: NEGATIVE
RBC, UA: NEGATIVE
Specific Gravity, UA: 1.019 (ref 1.005–1.030)
Urobilinogen, Ur: 0.2 mg/dL (ref 0.2–1.0)
pH, UA: 6 (ref 5.0–7.5)

## 2018-01-19 LAB — MEASLES/MUMPS/RUBELLA IMMUNITY
Mumps IgG: 105 [AU]/ml
Rubella: 13.5 {index}
Rubeola IgG: >300 [AU]/ml

## 2018-01-22 ENCOUNTER — Encounter: Payer: Self-pay | Admitting: Anesthesiology

## 2018-01-22 MED ORDER — CEFAZOLIN SODIUM-DEXTROSE 2-4 GM/100ML-% IV SOLN
2.0000 g | INTRAVENOUS | Status: AC
  Administered 2018-01-23: 2 g via INTRAVENOUS

## 2018-01-23 ENCOUNTER — Encounter
Admission: RE | Disposition: A | Discharge status: Home / Self Care | Payer: Self-pay | Source: Home / Self Care | Attending: Urology

## 2018-01-23 ENCOUNTER — Encounter: Payer: Self-pay | Admitting: *Deleted

## 2018-01-23 ENCOUNTER — Ambulatory Visit: Payer: 59 | Admitting: Anesthesiology

## 2018-01-23 ENCOUNTER — Telehealth: Payer: Self-pay | Admitting: *Deleted

## 2018-01-23 ENCOUNTER — Other Ambulatory Visit: Payer: Self-pay

## 2018-01-23 ENCOUNTER — Other Ambulatory Visit: Payer: Self-pay | Admitting: Internal Medicine

## 2018-01-23 ENCOUNTER — Ambulatory Visit
Admission: RE | Admit: 2018-01-23 | Discharge: 2018-01-23 | Disposition: A | Discharge status: Home / Self Care | Payer: 59 | Attending: Urology | Admitting: Urology

## 2018-01-23 DIAGNOSIS — N135 Crossing vessel and stricture of ureter without hydronephrosis: Secondary | ICD-10-CM | POA: Diagnosis not present

## 2018-01-23 DIAGNOSIS — Z8249 Family history of ischemic heart disease and other diseases of the circulatory system: Secondary | ICD-10-CM | POA: Insufficient documentation

## 2018-01-23 DIAGNOSIS — E119 Type 2 diabetes mellitus without complications: Secondary | ICD-10-CM | POA: Diagnosis not present

## 2018-01-23 DIAGNOSIS — N200 Calculus of kidney: Secondary | ICD-10-CM

## 2018-01-23 DIAGNOSIS — N529 Male erectile dysfunction, unspecified: Secondary | ICD-10-CM | POA: Insufficient documentation

## 2018-01-23 DIAGNOSIS — Z79899 Other long term (current) drug therapy: Secondary | ICD-10-CM | POA: Diagnosis not present

## 2018-01-23 DIAGNOSIS — N132 Hydronephrosis with renal and ureteral calculous obstruction: Secondary | ICD-10-CM | POA: Diagnosis not present

## 2018-01-23 DIAGNOSIS — F419 Anxiety disorder, unspecified: Secondary | ICD-10-CM | POA: Diagnosis not present

## 2018-01-23 DIAGNOSIS — N202 Calculus of kidney with calculus of ureter: Secondary | ICD-10-CM | POA: Diagnosis not present

## 2018-01-23 DIAGNOSIS — I10 Essential (primary) hypertension: Secondary | ICD-10-CM | POA: Diagnosis not present

## 2018-01-23 DIAGNOSIS — E785 Hyperlipidemia, unspecified: Secondary | ICD-10-CM | POA: Diagnosis not present

## 2018-01-23 DIAGNOSIS — Z7984 Long term (current) use of oral hypoglycemic drugs: Secondary | ICD-10-CM | POA: Diagnosis not present

## 2018-01-23 HISTORY — PX: CYSTOSCOPY/RETROGRADE/URETEROSCOPY: SHX5316

## 2018-01-23 HISTORY — PX: CYSTOSCOPY WITH STENT PLACEMENT: SHX5790

## 2018-01-23 LAB — GLUCOSE, CAPILLARY
Glucose-Capillary: 128 mg/dL — ABNORMAL HIGH (ref 70–99)
Glucose-Capillary: 141 mg/dL — ABNORMAL HIGH (ref 70–99)

## 2018-01-23 SURGERY — CYSTOSCOPY/RETROGRADE/URETEROSCOPY
Anesthesia: General | Site: Ureter | Laterality: Right

## 2018-01-23 MED ORDER — ONDANSETRON HCL 4 MG/2ML IJ SOLN: 4.0000 mg | Freq: Once | INTRAMUSCULAR | Status: DC | PRN

## 2018-01-23 MED ORDER — HYDROCODONE-ACETAMINOPHEN 5-325 MG PO TABS: 1.0000 | ORAL_TABLET | ORAL | 0 refills | Status: AC | PRN

## 2018-01-23 MED ORDER — ONDANSETRON HCL 4 MG/2ML IJ SOLN
INTRAMUSCULAR | Status: AC
  Filled 2018-01-23: qty 2

## 2018-01-23 MED ORDER — CEFAZOLIN SODIUM-DEXTROSE 2-4 GM/100ML-% IV SOLN
INTRAVENOUS | Status: AC
  Filled 2018-01-23: qty 100

## 2018-01-23 MED ORDER — SUGAMMADEX SODIUM 200 MG/2ML IV SOLN
INTRAVENOUS | Status: AC
  Filled 2018-01-23: qty 2

## 2018-01-23 MED ORDER — FENTANYL CITRATE (PF) 100 MCG/2ML IJ SOLN: 25.0000 ug | INTRAMUSCULAR | Status: DC | PRN

## 2018-01-23 MED ORDER — TAMSULOSIN HCL 0.4 MG PO CAPS: 0.4000 mg | ORAL_CAPSULE | Freq: Every day | ORAL | 0 refills | Status: DC

## 2018-01-23 MED ORDER — FENTANYL CITRATE (PF) 100 MCG/2ML IJ SOLN
INTRAMUSCULAR | Status: AC
  Filled 2018-01-23: qty 2

## 2018-01-23 MED ORDER — SUGAMMADEX SODIUM 200 MG/2ML IV SOLN
INTRAVENOUS | Status: DC | PRN
  Administered 2018-01-23: 200 mg via INTRAVENOUS

## 2018-01-23 MED ORDER — PROPOFOL 10 MG/ML IV BOLUS
INTRAVENOUS | Status: DC | PRN
  Administered 2018-01-23: 160 mg via INTRAVENOUS

## 2018-01-23 MED ORDER — MIDAZOLAM HCL 2 MG/2ML IJ SOLN
INTRAMUSCULAR | Status: DC | PRN
  Administered 2018-01-23: 2 mg via INTRAVENOUS

## 2018-01-23 MED ORDER — IOPAMIDOL (ISOVUE-M 200) INJECTION 41%
INTRAMUSCULAR | Status: DC | PRN
  Administered 2018-01-23: 60 mL

## 2018-01-23 MED ORDER — LIDOCAINE HCL (CARDIAC) PF 100 MG/5ML IV SOSY
PREFILLED_SYRINGE | INTRAVENOUS | Status: DC | PRN
  Administered 2018-01-23: 50 mg via INTRAVENOUS

## 2018-01-23 MED ORDER — FENTANYL CITRATE (PF) 100 MCG/2ML IJ SOLN
INTRAMUSCULAR | Status: DC | PRN
  Administered 2018-01-23 (×2): 50 ug via INTRAVENOUS

## 2018-01-23 MED ORDER — MIDAZOLAM HCL 2 MG/2ML IJ SOLN
INTRAMUSCULAR | Status: AC
  Filled 2018-01-23: qty 2

## 2018-01-23 MED ORDER — SUCCINYLCHOLINE CHLORIDE 20 MG/ML IJ SOLN
INTRAMUSCULAR | Status: DC | PRN
  Administered 2018-01-23: 100 mg via INTRAVENOUS

## 2018-01-23 MED ORDER — PROPOFOL 10 MG/ML IV BOLUS
INTRAVENOUS | Status: AC
  Filled 2018-01-23: qty 20

## 2018-01-23 MED ORDER — LIDOCAINE HCL (PF) 2 % IJ SOLN
INTRAMUSCULAR | Status: AC
  Filled 2018-01-23: qty 10

## 2018-01-23 MED ORDER — FAMOTIDINE 20 MG PO TABS
ORAL_TABLET | ORAL | Status: AC
  Administered 2018-01-23: 20 mg via ORAL
  Filled 2018-01-23: qty 1

## 2018-01-23 MED ORDER — ROCURONIUM BROMIDE 100 MG/10ML IV SOLN
INTRAVENOUS | Status: DC | PRN
  Administered 2018-01-23: 5 mg via INTRAVENOUS
  Administered 2018-01-23: 25 mg via INTRAVENOUS

## 2018-01-23 MED ORDER — ONDANSETRON HCL 4 MG/2ML IJ SOLN
INTRAMUSCULAR | Status: DC | PRN
  Administered 2018-01-23: 4 mg via INTRAVENOUS

## 2018-01-23 MED ORDER — GLYCOPYRROLATE 0.2 MG/ML IJ SOLN
INTRAMUSCULAR | Status: DC | PRN
  Administered 2018-01-23: 0.2 mg via INTRAVENOUS

## 2018-01-23 MED ORDER — DEXAMETHASONE SODIUM PHOSPHATE 10 MG/ML IJ SOLN
INTRAMUSCULAR | Status: DC | PRN
  Administered 2018-01-23: 5 mg via INTRAVENOUS

## 2018-01-23 MED ORDER — GLYCOPYRROLATE 0.2 MG/ML IJ SOLN
INTRAMUSCULAR | Status: AC
  Filled 2018-01-23: qty 1

## 2018-01-23 MED ORDER — SODIUM CHLORIDE 0.9 % IV SOLN
INTRAVENOUS | Status: DC
  Administered 2018-01-23: 08:00:00 via INTRAVENOUS

## 2018-01-23 MED ORDER — FAMOTIDINE 20 MG PO TABS
20.0000 mg | ORAL_TABLET | Freq: Once | ORAL | Status: AC
  Administered 2018-01-23: 20 mg via ORAL

## 2018-01-23 MED ORDER — SULFAMETHOXAZOLE-TRIMETHOPRIM 800-160 MG PO TABS: 1.0000 | ORAL_TABLET | Freq: Every day | ORAL | 0 refills | Status: DC

## 2018-01-23 SURGICAL SUPPLY — 34 items
BAG DRAIN CYSTO-URO LG1000N (MISCELLANEOUS) ×3 IMPLANT
BRUSH SCRUB EZ 1% IODOPHOR (MISCELLANEOUS) ×3 IMPLANT
BULB IRRIG PATHFIND (MISCELLANEOUS) IMPLANT
CATH URETL 5X70 OPEN END (CATHETERS) ×1 IMPLANT
CNTNR SPEC 2.5X3XGRAD LEK (MISCELLANEOUS)
CONT SPEC 4OZ STER OR WHT (MISCELLANEOUS)
CONTAINER SPEC 2.5X3XGRAD LEK (MISCELLANEOUS) IMPLANT
DRAPE UTILITY 15X26 TOWEL STRL (DRAPES) ×3 IMPLANT
FIBER LASER LITHO 273 (Laser) IMPLANT
GLOVE BIOGEL PI IND STRL 7.5 (GLOVE) ×2 IMPLANT
GLOVE BIOGEL PI INDICATOR 7.5 (GLOVE) ×1
GOWN STRL REUS W/ TWL LRG LVL3 (GOWN DISPOSABLE) ×2 IMPLANT
GOWN STRL REUS W/ TWL XL LVL3 (GOWN DISPOSABLE) ×2 IMPLANT
GOWN STRL REUS W/TWL LRG LVL3 (GOWN DISPOSABLE) ×1
GOWN STRL REUS W/TWL XL LVL3 (GOWN DISPOSABLE) ×1
GUIDEWIRE INTRO SET STRAIGHT (WIRE) ×1 IMPLANT
INFUSOR MANOMETER BAG 3000ML (MISCELLANEOUS) ×3 IMPLANT
INTRODUCER DILATOR DOUBLE (INTRODUCER) ×1 IMPLANT
KIT TURNOVER CYSTO (KITS) ×3 IMPLANT
PACK CYSTO AR (MISCELLANEOUS) ×3 IMPLANT
SENSORWIRE 0.038 NOT ANGLED (WIRE) ×3
SET CYSTO W/LG BORE CLAMP LF (SET/KITS/TRAYS/PACK) ×3 IMPLANT
SHEATH URETERAL 12FRX35CM (MISCELLANEOUS) IMPLANT
SOL .9 NS 3000ML IRR  AL (IV SOLUTION) ×1
SOL .9 NS 3000ML IRR UROMATIC (IV SOLUTION) ×2 IMPLANT
STENT CONTOUR NO GW 8FR 26CM (STENTS) ×1 IMPLANT
STENT URET 6FRX24 CONTOUR (STENTS) IMPLANT
STENT URET 6FRX26 CONTOUR (STENTS) IMPLANT
SURGILUBE 2OZ TUBE FLIPTOP (MISCELLANEOUS) ×3 IMPLANT
SYR 10ML LL (SYRINGE) ×3 IMPLANT
TUBING ART PRESS 48 MALE/FEM (TUBING) IMPLANT
VALVE UROSEAL ADJ ENDO (VALVE) ×1 IMPLANT
WATER STERILE IRR 1000ML POUR (IV SOLUTION) ×3 IMPLANT
WIRE SENSOR 0.038 NOT ANGLED (WIRE) ×2 IMPLANT

## 2018-01-23 NOTE — Telephone Encounter (Signed)
See urgent referral   [place]

## 2018-01-23 NOTE — Anesthesia Procedure Notes (Signed)
Procedure Name: Intubation Performed by: Rolla Plate, CRNA Pre-anesthesia Checklist: Patient identified, Patient being monitored, Timeout performed, Emergency Drugs available and Suction available Patient Re-evaluated:Patient Re-evaluated prior to induction Oxygen Delivery Method: Circle system utilized Preoxygenation: Pre-oxygenation with 100% oxygen Induction Type: IV induction Ventilation: Mask ventilation without difficulty Laryngoscope Size: Miller and 2 Grade View: Grade II Tube type: Oral Tube size: 7.5 mm Number of attempts: 1 Airway Equipment and Method: Stylet Placement Confirmation: ETT inserted through vocal cords under direct vision,  positive ETCO2 and breath sounds checked- equal and bilateral Secured at: 22 cm Tube secured with: Tape Dental Injury: Teeth and Oropharynx as per pre-operative assessment

## 2018-01-23 NOTE — Op Note (Signed)
Date of procedure: 01/23/18  Preoperative diagnosis:  1. Right proximal ureteral and renal stones  Postoperative diagnosis:  1. Right proximal ureteral stricture   Procedure: 1. Right retrograde pyelogram, diagnostic ureteroscopy, stent placement  Surgeon: Nickolas Madrid, MD  Anesthesia: General  Complications: None  Intraoperative findings:  1. Normal cystoscopy 2. ~1cm proximal ureteral stricture with upstream hydronephrosis 3. Unable to pass flexible ureteroscope 4. Right 59fx 26cm ureteral stent placed  EBL: Minimal  Specimens: None  Drains: Right 8 French by 26 cm ureteral stent  Indication: Andrew KESTLERis a 57y.o. patient with history of complex right ureteroscopy and impacted right proximal ureteral stone.  On CT scan he was found to have a 3 mm proximal right ureteral stone with a possible stricture below, as well as a large 1 cm right lower pole stone.  He elected ureteroscopy for management of the stones and evaluation of his possible stricture.  After reviewing the management options for treatment, they elected to proceed with the above surgical procedure(s). We have discussed the potential benefits and risks of the procedure, side effects of the proposed treatment, the likelihood of the patient achieving the goals of the procedure, and any potential problems that might occur during the procedure or recuperation. Informed consent has been obtained.  Description of procedure:  The patient was taken to the operating room and general anesthesia was induced.  The patient was placed in the dorsal lithotomy position, prepped and draped in the usual sterile fashion, and preoperative antibiotics were administered. A preoperative time-out was performed.   A 21 French rigid cystoscope was used to intubate the urethra.  The urethra was grossly normal.  The prostate was small in size.  Thorough cystoscopy revealed no concerning findings, and the ureteral orifices were orthotopic  bilaterally.  Retrograde pyelogram was performed through a 5 [place] catheter on the right side which showed approximately 1 cm of narrowing in the proximal ureter at the UPJ with upstream hydronephrosis.  I was able to advance a sensor wire through the access catheter into the collecting system.  The scope and access catheter were removed, and a dual-lumen access catheter was advanced over the wire to the mid ureter and used to add a second safety wire.  Notably, it was slightly difficult to advance the second wire into the collecting system past the stricture.  The flexible ureteroscope was advanced over the wire to the level of narrowing.  Under direct vision there was no notable to be a significant stricture that barely accommodated the sensor wire.  Photos were taken.  Contrast was injected which again demonstrated approximately 1 cm length of significant narrowing with upstream hydronephrosis.  The scope was removed and I attempted gentle dilation with 8 FPakistandilator.  The 8 FPakistandilator met significant resistance at the proximal ureteral stricture and this was aborted.  At this point I elected to place a stent for passive dilation with plan for definitive ureteroscopy in the future, versus consideration of a nuc med renal scan to evaluate residual right renal function in the setting of atrophied parenchyma, versus potential robotic right pyeloplasty and stone removal.  The rigid cystoscope was backloaded over the wire, and an 59 FP[place]by 26 cm ureteral stent was advanced over the wire.  Moderate resistance was met at the proximal ureteral stricture, however this was able to be advanced over the wire and into the collecting system.  There was an excellent curl noted proximally under fluoroscopic vision in  the previously opacified collecting system.  There was a good curl noted in the bladder.  Contrast and urine were seen to drain through the side-port of the stent.  The scope was removed and  this concluded our procedure  Disposition: Stable to PACU  Plan: -We will discuss his case with his primary urologist Dr. [name] -Options would include repeat right ureteroscopy with passive dilation with current stent in place, nuc med scan to evaluate for residual right renal function in the setting of the visibly decreased parenchyma, versus pursuing a robotic pyeloplasty with removal of stones at that time   Nickolas Madrid, MD

## 2018-01-23 NOTE — Anesthesia Postprocedure Evaluation (Signed)
Anesthesia Post Note  Patient: Andrew Stevens  Procedure(s) Performed: CYSTOSCOPY/RETROGRADE/URETEROSCOPY (Right ) CYSTOSCOPY WITH STENT PLACEMENT (Right Ureter)  Patient location during evaluation: PACU Anesthesia Type: General Level of consciousness: awake and alert Pain management: pain level controlled Vital Signs Assessment: post-procedure vital signs reviewed and stable Respiratory status: spontaneous breathing, nonlabored ventilation, respiratory function stable and patient connected to nasal cannula oxygen Cardiovascular status: blood pressure returned to baseline and stable Postop Assessment: no apparent nausea or vomiting Anesthetic complications: no     Last Vitals:  Vitals:   01/23/18 1047 01/23/18 1104  BP: (!) 156/81 (!) 150/89  Pulse: 73 72  Resp: 16 16  Temp: (!) 36.1 C   SpO2: 100% 100%    Last Pain:  Vitals:   01/23/18 1104  TempSrc:   PainSc: 0-No pain                 Lene Mckay S

## 2018-01-23 NOTE — Discharge Instructions (Signed)

## 2018-01-23 NOTE — Anesthesia Post-op Follow-up Note (Signed)
Anesthesia QCDR form completed.        

## 2018-01-23 NOTE — Telephone Encounter (Signed)
Copied from [place] [id number]. Topic: Referral - Request for Referral >> Jan 23, 2018  3:58 PM Andrew Stevens wrote: Has patient seen PCP for this complaint? Yes  *If NO, is insurance requiring patient see PCP for this issue before PCP can refer them? Referral for which specialty:  Urology  they need this sent before the 1st if it not sent it will not get covered -  pt has centivo and it is urgent that they rec referral by the end of the year  Preferred provider/office: dr [name]  Reason for referral: pt had kidney stones  Pt has another appt on 01/29/2018

## 2018-01-23 NOTE — Transfer of Care (Signed)
Immediate Anesthesia Transfer of Care Note  Patient: Andrew Stevens  Procedure(s) Performed: CYSTOSCOPY/RETROGRADE/URETEROSCOPY (Right ) CYSTOSCOPY WITH STENT PLACEMENT (Right Ureter)  Patient Location: PACU  Anesthesia Type:General  Level of Consciousness: awake  Airway & Oxygen Therapy: Patient Spontanous Breathing and Patient connected to face mask oxygen  Post-op Assessment: Report given to RN and Post -op Vital signs reviewed and stable  Post vital signs: Reviewed  Last Vitals:  Vitals Value Taken Time  BP 135/89 01/23/2018 10:08 AM  Temp    Pulse 60 01/23/2018 10:08 AM  Resp 12 01/23/2018 10:08 AM  SpO2 100 % 01/23/2018 10:08 AM    Last Pain:  Vitals:   01/23/18 0754  TempSrc: Tympanic  PainSc: 0-No pain         Complications: No apparent anesthesia complications

## 2018-01-23 NOTE — H&P (Signed)
UROLOGY H&P UPDATE  Agree with prior H&P dated 01/02/2018.  To summarize, Andrew Stevens is a 57 year old patient of Dr. Dene Gentry with bilateral nephrolithiasis.  The right side has a proximal ureteral stone with a possible proximal ureteral stricture as well as a large 1 cm lower pole stone.  The left side has significant stone burden and may be more amenable to PCNL versus staged ureteroscopy.  I was asked by Dr. [name] to address the right-sided stone burden today with ureteroscopy.  Cardiac: RRR Lungs: CTA bilaterally  Laterality: Right Procedure: Right retrograde pyelogram, right ureteroscopy, laser lithotripsy, stent placement  Urinalysis: Urine culture 12/17 no growth  Informed consent obtained, we specifically discussed the risk of bleeding, infection, need for additional procedures, stent related symptoms, ureteral injury.  We specifically discussed that this may require a staged attempt if unable to pass the flexible ureteroscope into the collecting system secondary to the possible proximal ureteral stricture seen on CT scan.  If that is the case, will place a ureteral stent for passive dilation, with plan for follow-up right ureteroscopy in 2 to 3 weeks for definitive stone management.  We also discussed the very low, but not 0, risk of inability to access the collecting system from below requiring percutaneous approach.  Billey Co, MD 01/23/2018

## 2018-01-23 NOTE — Anesthesia Preprocedure Evaluation (Signed)
Anesthesia Evaluation  Patient identified by MRN, date of birth, ID band Patient awake    Reviewed: Allergy & Precautions, NPO status , Patient's Chart, lab work & pertinent test results, reviewed documented beta blocker date and time   History of Anesthesia Complications (+) DIFFICULT AIRWAY  Airway Mallampati: II  TM Distance: >3 FB     Dental  (+) Chipped   Pulmonary           Cardiovascular hypertension, Pt. on medications      Neuro/Psych    GI/Hepatic   Endo/Other  diabetes, Type 2  Renal/GU Renal disease     Musculoskeletal   Abdominal   Peds  Hematology   Anesthesia Other Findings No problem with intubation the last time.  Reproductive/Obstetrics                             Anesthesia Physical Anesthesia Plan  ASA: III  Anesthesia Plan: General   Post-op Pain Management:    Induction: Intravenous  PONV Risk Score and Plan:   Airway Management Planned: Oral ETT and LMA  Additional Equipment:   Intra-op Plan:   Post-operative Plan:   Informed Consent: I have reviewed the patients History and Physical, chart, labs and discussed the procedure including the risks, benefits and alternatives for the proposed anesthesia with the patient or authorized representative who has indicated his/her understanding and acceptance.     Plan Discussed with: CRNA  Anesthesia Plan Comments:         Anesthesia Quick Evaluation

## 2018-01-26 NOTE — Telephone Encounter (Signed)
Referral was placed by PCP. Patient's responsibility to contact insurance.

## 2018-01-29 ENCOUNTER — Telehealth: Payer: Self-pay | Admitting: Urology

## 2018-01-29 NOTE — Telephone Encounter (Signed)
[place],  Do you want to call him to discuss plan to send to [facility] and NM scan, or see him in clinic to discuss?  Aaron Edelman

## 2018-01-29 NOTE — Telephone Encounter (Signed)
Patient had PCP send over a referral for a follow up app for post op His wife stated that he needed to come in for stent removal? I told her that you were going to discuss with [facility] and we would get back to them. Can you let me know what he is going to need.   Thanks, Sharyn Lull

## 2018-01-30 NOTE — Telephone Encounter (Signed)
I'll give him a call

## 2018-02-20 ENCOUNTER — Ambulatory Visit (INDEPENDENT_AMBULATORY_CARE_PROVIDER_SITE_OTHER): Payer: No Typology Code available for payment source | Admitting: Urology

## 2018-02-20 ENCOUNTER — Encounter: Payer: Self-pay | Admitting: Urology

## 2018-02-20 VITALS — BP 129/73 | HR 76 | Ht 70.0 in | Wt 199.5 lb

## 2018-02-20 DIAGNOSIS — N135 Crossing vessel and stricture of ureter without hydronephrosis: Secondary | ICD-10-CM | POA: Diagnosis not present

## 2018-02-20 DIAGNOSIS — N200 Calculus of kidney: Secondary | ICD-10-CM | POA: Diagnosis not present

## 2018-02-20 NOTE — Progress Notes (Signed)
02/20/2018  11:32 AM   Andrew Stevens 01/28/1961 [id number]  Referring provider: McLean-Scocuzza, Nino Glow, MD [place], [place] [address]  Chief Complaint  Patient presents with  . Nephrolithiasis    HPI: Andrew Stevens is a 58 y.o. Other or two or more races male that presents today for consultation concerning his nephrolithiasis. He is accompanied by his wife.   - Patient underwent right retrograde pyelogram/ diagnostic ureteroscopy/stent placement with Dr. Diamantina Providence on 01/23/2018. Findings: proximal ureteral stricture with upstream hydronephrosis; unable to pass flexible ureteroscope; normal cystoscopy  - Patient is experiencing increased physical discomfort with stent, increased frequency, nocturia x 3  - Patient presents many concerns regarding follow up procedures and post-op  PMH: Past Medical History:  Diagnosis Date  . Anxiety   . Diabetes mellitus without complication ([place])   . Difficult intubation   . Erectile dysfunction   . History of kidney stones   . Hyperlipidemia   . Hypertension   . Lipoma of extremity    Surgical History: Past Surgical History:  Procedure Laterality Date  . BRAIN SURGERY  1989   Cyst removed - benign  . CYSTOSCOPY W/ URETERAL STENT PLACEMENT Right 05/27/2017   Procedure: CYSTOSCOPY WITH STENT REPLACEMENT;  Surgeon: [name], MD;  Location: [facility];  Service: Urology;  Laterality: Right;  . CYSTOSCOPY WITH STENT PLACEMENT Right 01/23/2018   Procedure: CYSTOSCOPY WITH STENT PLACEMENT;  Surgeon: Billey Co, MD;  Location: [facility];  Service: Urology;  Laterality: Right;  . CYSTOSCOPY/RETROGRADE/URETEROSCOPY Right 01/23/2018   Procedure: CYSTOSCOPY/RETROGRADE/URETEROSCOPY;  Surgeon: Billey Co, MD;  Location: [facility];  Service: Urology;  Laterality: Right;  . CYSTOSCOPY/URETEROSCOPY/HOLMIUM LASER Right 05/27/2017   Procedure: CYSTOSCOPY/URETEROSCOPY/HOLMIUM LASER;  Surgeon: [name], MD;  Location:  [facility];  Service: Urology;  Laterality: Right;  . CYSTOSCOPY/URETEROSCOPY/HOLMIUM LASER/STENT PLACEMENT Right 04/11/2017   Procedure: CYSTOSCOPY/URETEROSCOPY/HOLMIUM LASER/STENT PLACEMENT;  Surgeon: [name], MD;  Location: [facility];  Service: Urology;  Laterality: Right;  . INGUINAL HERNIA REPAIR    . MASS EXCISION Bilateral 04/22/2017   Procedure: EXCISION OF 3  MASSES OF PERIRECTAL, INNER THIGH AREAS;  Surgeon: Johnathan Hausen, MD;  Location: [place];  Service: General;  Laterality: Bilateral;  LMA  . ROTATOR CUFF REPAIR Right 2013   Dr. Rudene Christians   . ROTATOR CUFF REPAIR Right 2014   Dr. Mack Guise  . VENTRAL HERNIA REPAIR      Home Medications:  Allergies as of 02/20/2018   No Known Allergies     Medication List       Accurate as of February 20, 2018 11:32 AM. Always use your most recent med list.        amLODipine 5 MG tablet Commonly known as:  NORVASC Take 1 tablet (5 mg total) by mouth every morning.   atorvastatin 20 MG tablet Commonly known as:  LIPITOR TAKE 1 TABLET BY MOUTH DAILY AT NIGHT   chlorthalidone 25 MG tablet Commonly known as:  HYGROTON Take 1 tablet (25 mg total) by mouth daily.   metFORMIN 500 MG tablet Commonly known as:  GLUCOPHAGE TAKE 1 TABLET BY MOUTH 2 TIMES DAILY WITH A MEAL.   tadalafil 20 MG tablet Commonly known as:  ADCIRCA/CIALIS 1 tab 1 hour prior to intercourse   tamsulosin 0.4 MG Caps capsule Commonly known as:  FLOMAX Take 1 capsule (0.4 mg total) by mouth daily after supper.   telmisartan 80 MG tablet Commonly known as:  MICARDIS Take 1  tablet (80 mg total) by mouth daily.       Allergies: No Known Allergies  Family History: Family History  Problem Relation Age of Onset  . Hypertension Mother   . Rheum arthritis Mother   . Alcohol abuse Brother   . Heart attack Sister   . Early death Sister        During childbirth  . Thyroid disease Sister   . Colon cancer Neg Hx   . Prostate cancer Neg Hx      Social History:  reports that he has never smoked. He has never used smokeless tobacco. He reports current alcohol use. He reports that he does not use drugs.  ROS: UROLOGY Frequent Urination?: Yes Hard to postpone urination?: No Burning/pain with urination?: No Get up at night to urinate?: No Leakage of urine?: No Urine stream starts and stops?: No Trouble starting stream?: No Do you have to strain to urinate?: No Blood in urine?: No Urinary tract infection?: No Sexually transmitted disease?: No Injury to kidneys or bladder?: No Painful intercourse?: No Weak stream?: No Erection problems?: No Penile pain?: No  Gastrointestinal Nausea?: No Vomiting?: No Indigestion/heartburn?: No Diarrhea?: No Constipation?: No  Constitutional Fever: No Night sweats?: No Weight loss?: No Fatigue?: No  Skin Skin rash/lesions?: No Itching?: No  Eyes Blurred vision?: No Double vision?: No  Ears/Nose/Throat Sore throat?: No Sinus problems?: No  Hematologic/Lymphatic Swollen glands?: No Easy bruising?: No  Cardiovascular Leg swelling?: No Chest pain?: No  Respiratory Cough?: No Shortness of breath?: No  Endocrine Excessive thirst?: No  Musculoskeletal Back pain?: Yes Joint pain?: No  Neurological Headaches?: No Dizziness?: No  Psychologic Depression?: No Anxiety?: No  Physical Exam: BP 129/73 (BP Location: Left Arm, Patient Position: Sitting, Cuff Size: Normal)   Pulse 76   Ht 5\' 10"  (1.778 m)   Wt 199 lb 8 oz (90.5 kg)   BMI 28.63 kg/m   Constitutional:  Alert and oriented, No acute distress.   Assessment & Plan:    1. Ureteral Stricture  - Approximately 1cm proximal ureteral stricture with upstream hydronephrosis found during right pyelogram  - Discussed surgical options.  He is at high risk for recurrent stricture with follow-up ureteroscopy and laser lithotripsy of his right renal calculi.  Discussed referral for laparoscopic stricture  excision/repair and stone removal.  He states [facility] is out of network and will check with [facility] in [place] regarding availability prior to out of network referral.  2. Urinary frequency  - Provided samples of Myrbetriq 50 mg for stent irritation   15-minute discussion with patient regarding treatment options, risks and benefits. The patient had many questions and concerns regarding these options and the long-term effects.  [name], MD [facility] [address], [place], [place] [address] [phone]  I, [phone] Renne Crigler , am acting as a scribe for [name], MD  I, [name], MD, have reviewed all documentation for this visit. The documentation on 02/20/18 for the exam, diagnosis, procedures, and orders are all accurate and complete.

## 2018-02-20 NOTE — Patient Instructions (Signed)
Dietary Guidelines to Help Prevent Kidney Stones Kidney stones are deposits of minerals and salts that form inside your kidneys. Your risk of developing kidney stones may be greater depending on your diet, your lifestyle, the medicines you take, and whether you have certain medical conditions. Most people can reduce their chances of developing kidney stones by following the instructions below. Depending on your overall health and the type of kidney stones you tend to develop, your dietitian may give you more specific instructions. What are tips for following this plan? Reading food labels  Choose foods with "no salt added" or "low-salt" labels. Limit your sodium intake to less than 1500 mg per day.  Choose foods with calcium for each meal and snack. Try to eat about 300 mg of calcium at each meal. Foods that contain 200-500 mg of calcium per serving include: ? 8 oz (237 ml) of milk, fortified nondairy milk, and fortified fruit juice. ? 8 oz (237 ml) of kefir, yogurt, and soy yogurt. ? 4 oz (118 ml) of tofu. ? 1 oz of cheese. ? 1 cup (300 g) of dried figs. ? 1 cup (91 g) of cooked broccoli. ? 1-3 oz can of sardines or mackerel.  Most people need 1000 to 1500 mg of calcium each day. Talk to your dietitian about how much calcium is recommended for you. Shopping  Buy plenty of fresh fruits and vegetables. Most people do not need to avoid fruits and vegetables, even if they contain nutrients that may contribute to kidney stones.  When shopping for convenience foods, choose: ? Whole pieces of fruit. ? Premade salads with dressing on the side. ? Low-fat fruit and yogurt smoothies.  Avoid buying frozen meals or prepared deli foods.  Look for foods with live cultures, such as yogurt and kefir. Cooking  Do not add salt to food when cooking. Place a salt shaker on the table and allow each person to add his or her own salt to taste.  Use vegetable protein, such as beans, textured vegetable  protein (TVP), or tofu instead of meat in pasta, casseroles, and soups. Meal planning   Eat less salt, if told by your dietitian. To do this: ? Avoid eating processed or premade food. ? Avoid eating fast food.  Eat less animal protein, including cheese, meat, poultry, or fish, if told by your dietitian. To do this: ? Limit the number of times you have meat, poultry, fish, or cheese each week. Eat a diet free of meat at least 2 days a week. ? Eat only one serving each day of meat, poultry, fish, or seafood. ? When you prepare animal protein, cut pieces into small portion sizes. For most meat and fish, one serving is about the size of one deck of cards.  Eat at least 5 servings of fresh fruits and vegetables each day. To do this: ? Keep fruits and vegetables on hand for snacks. ? Eat 1 piece of fruit or a handful of berries with breakfast. ? Have a salad and fruit at lunch. ? Have two kinds of vegetables at dinner.  Limit foods that are high in a substance called oxalate. These include: ? Spinach. ? Rhubarb. ? Beets. ? Potato chips and french fries. ? Nuts.  If you regularly take a diuretic medicine, make sure to eat at least 1-2 fruits or vegetables high in potassium each day. These include: ? Avocado. ? Banana. ? Orange, prune, carrot, or tomato juice. ? Baked potato. ? Cabbage. ? Beans and split   If you regularly take a diuretic medicine, make sure to eat at least 1-2 fruits or vegetables high in potassium each day. These include:  ? Avocado.  ? Banana.  ? Orange, prune, carrot, or tomato juice.  ? Baked potato.  ? Cabbage.  ? Beans and split peas.  General instructions     Drink enough fluid to keep your urine clear or pale yellow. This is the most important thing you can do.   Talk to your health care provider and dietitian about taking daily supplements. Depending on your health and the cause of your kidney stones, you may be advised:  ? Not to take supplements with vitamin C.  ? To take a calcium supplement.  ? To take a daily probiotic supplement.  ? To take other supplements such as magnesium, fish oil, or vitamin B6.   Take all medicines and supplements as told by your health care provider.   Limit alcohol intake to no  more than 1 drink a day for nonpregnant women and 2 drinks a day for men. One drink equals 12 oz of beer, 5 oz of wine, or 1 oz of hard liquor.   Lose weight if told by your health care provider. Work with your dietitian to find strategies and an eating plan that works best for you.  What foods are not recommended?  Limit your intake of the following foods, or as told by your dietitian. Talk to your dietitian about specific foods you should avoid based on the type of kidney stones and your overall health.  Grains  Breads. Bagels. Rolls. Baked goods. Salted crackers. Cereal. Pasta.  Vegetables  Spinach. Rhubarb. Beets. Canned vegetables. Pickles. Olives.  Meats and other protein foods  Nuts. Nut butters. Large portions of meat, poultry, or fish. Salted or cured meats. Deli meats. Hot dogs. Sausages.  Dairy  Cheese.  Beverages  Regular soft drinks. Regular vegetable juice.  Seasonings and other foods  Seasoning blends with salt. Salad dressings. Canned soups. Soy sauce. Ketchup. Barbecue sauce. Canned pasta sauce. Casseroles. Pizza. Lasagna. Frozen meals. Potato chips. French fries.  Summary   You can reduce your risk of kidney stones by making changes to your diet.   The most important thing you can do is drink enough fluid. You should drink enough fluid to keep your urine clear or pale yellow.   Ask your health care provider or dietitian how much protein from animal sources you should eat each day, and also how much salt and calcium you should have each day.  This information is not intended to replace advice given to you by your health care provider. Make sure you discuss any questions you have with your health care provider.  Document Released: 05/11/2010 Document Revised: 12/26/2015 Document Reviewed: 12/26/2015  Elsevier Interactive Patient Education  2019 [employer]

## 2018-03-16 ENCOUNTER — Telehealth: Payer: Self-pay | Admitting: Urology

## 2018-03-16 NOTE — Telephone Encounter (Signed)
Patient called requesting a referral to [facility], I entered a referral and patient will await a call for an appointment.    Sharyn Lull

## 2018-03-17 NOTE — Telephone Encounter (Signed)
I emailed Dr. Alinda Money about this patient with a ureteral stricture and he indicated he would be happy to see him.  He needs to specifically see Dr. Alinda Money.  Thanks

## 2018-03-18 NOTE — Telephone Encounter (Signed)
Thanks, Michelle!  

## 2018-03-27 ENCOUNTER — Telehealth: Payer: Self-pay | Admitting: Internal Medicine

## 2018-03-27 NOTE — Telephone Encounter (Signed)
Copied from [place] [id number]. Topic: General - Other >> Mar 27, 2018  3:19 PM Keene Breath wrote: Reason for CRM: Patient called to inform the nurse or doctor that his referral to [facility] has been sent to [facility].  Patient is not sure what has to be done, but he was told to let his PCP know that the referral had been transferred to the new urologist.  Patient also said that he is scheduled to have a procedure on tuesday.  Please advise and call patient back to let him know if there is something else that needs to be done before his procedure.  CB# [phone]

## 2018-03-27 NOTE — Telephone Encounter (Signed)
Call pt   Urology referred due to he is at high risk for recurrent stricture with follow-up ureteroscopy and laser lithotripsy of his right renal calculi.   Discussed referral for laparoscopic stricture excision/repair and stone removal. He states [facility] is out of network and will check with [facility] in [place] regarding availability prior to out of network referral  This is reason for referral per urology notes   [place]

## 2018-04-07 ENCOUNTER — Other Ambulatory Visit: Payer: Self-pay | Admitting: Urology

## 2018-04-15 NOTE — Progress Notes (Signed)
01-13-18 ( Epic) EKG

## 2018-04-15 NOTE — Progress Notes (Signed)
Pt called related to COVID-19 screening prior to PAT appointment. LVM for a return call.

## 2018-04-15 NOTE — Patient Instructions (Signed)
Andrew Stevens  04/15/2018   Your procedure is scheduled on: 04-23-18    Report to [facility] Main  Entrance    Report to Admitting at 6:45 AM    Call this number if you have problems the morning of surgery [phone]    Remember: Do not eat food or drink liquids :After Midnight.    BRUSH YOUR TEETH MORNING OF SURGERY AND RINSE YOUR MOUTH OUT, NO CHEWING GUM CANDY OR MINTS.     Take these medicines the morning of surgery with A SIP OF WATER: Amlodipine (Norvasc)   DO NOT TAKE ANY DIABETIC MEDICATIONS DAY OF YOUR SURGERY                               You may not have any metal on your body including hair pins and              piercings  Do not wear jewelry, cologne, lotions, powders or deodorant             Men may shave face and neck.   Do not bring valuables to the hospital. [place].  Contacts, dentures or bridgework may not be worn into surgery.      Patients discharged the day of surgery will not be allowed to drive home. IF YOU ARE HAVING SURGERY AND GOING HOME THE SAME DAY, YOU MUST HAVE AN ADULT TO DRIVE YOU HOME AND BE WITH YOU FOR 24 HOURS. YOU MAY GO HOME BY TAXI OR UBER OR ORTHERWISE, BUT AN ADULT MUST ACCOMPANY YOU HOME AND STAY WITH YOU FOR 24 HOURS.  Name and phone number of your driver:  Special Instructions: N/A              Please read over the following fact sheets you were given: _____________________________________________________________________             How to Manage Your Diabetes Before and After Surgery  Why is it important to control my blood sugar before and after surgery? . Improving blood sugar levels before and after surgery helps healing and can limit problems. . A way of improving blood sugar control is eating a healthy diet by: o  Eating less sugar and carbohydrates o  Increasing activity/exercise o  Talking with your doctor about reaching your blood sugar  goals . High blood sugars (greater than 180 mg/dL) can raise your risk of infections and slow your recovery, so you will need to focus on controlling your diabetes during the weeks before surgery. . Make sure that the doctor who takes care of your diabetes knows about your planned surgery including the date and location.  How do I manage my blood sugar before surgery? . Check your blood sugar at least 4 times a day, starting 2 days before surgery, to make sure that the level is not too high or low. o Check your blood sugar the morning of your surgery when you wake up and every 2 hours until you get to the Short Stay unit. . If your blood sugar is less than 70 mg/dL, you will need to treat for low blood sugar: o Do not take insulin. o Treat a low blood sugar (less than 70 mg/dL) with  cup  of clear juice (cranberry or apple), 4 glucose tablets, OR glucose gel. o Recheck blood sugar in 15 minutes after treatment (to make sure it is greater than 70 mg/dL). If your blood sugar is not greater than 70 mg/dL on recheck, call [phone] for further instructions. . Report your blood sugar to the short stay nurse when you get to Short Stay.  . If you are admitted to the hospital after surgery: o Your blood sugar will be checked by the staff and you will probably be given insulin after surgery (instead of oral diabetes medicines) to make sure you have good blood sugar levels. o The goal for blood sugar control after surgery is 80-180 mg/dL.   WHAT DO I DO ABOUT MY DIABETES MEDICATION?  Marland Kitchen Do not take oral diabetes medicines (pills) the morning of surgery.  . THE DAY BEFORE SURGERY, take your usual dose of Metformin       Reviewed and Endorsed by [facility] Patient Education Committee, August 2015    [facility] - Preparing for Surgery Before surgery, you can play an important role.  Because skin is not sterile, your skin needs to be as free of germs as possible.  You can reduce the number of  germs on your skin by washing with CHG (chlorahexidine gluconate) soap before surgery.  CHG is an antiseptic cleaner which kills germs and bonds with the skin to continue killing germs even after washing. Please DO NOT use if you have an allergy to CHG or antibacterial soaps.  If your skin becomes reddened/irritated stop using the CHG and inform your nurse when you arrive at Short Stay. Do not shave (including legs and underarms) for at least 48 hours prior to the first CHG shower.  You may shave your face/neck. Please follow these instructions carefully:  1.  Shower with CHG Soap the night before surgery and the  morning of Surgery.  2.  If you choose to wash your hair, wash your hair first as usual with your  normal  shampoo.  3.  After you shampoo, rinse your hair and body thoroughly to remove the  shampoo.                           4.  Use CHG as you would any other liquid soap.  You can apply chg directly  to the skin and wash                       Gently with a scrungie or clean washcloth.  5.  Apply the CHG Soap to your body ONLY FROM THE NECK DOWN.   Do not use on face/ open                           Wound or open sores. Avoid contact with eyes, ears mouth and genitals (private parts).                       Wash face,  Genitals (private parts) with your normal soap.             6.  Wash thoroughly, paying special attention to the area where your surgery  will be performed.  7.  Thoroughly rinse your body with warm water from the neck down.  8.  DO NOT shower/wash with your normal soap after using and rinsing off  the CHG  Soap.                9.  Pat yourself dry with a clean towel.            10.  Wear clean pajamas.            11.  Place clean sheets on your bed the night of your first shower and do not  sleep with pets. Day of Surgery : Do not apply any lotions/deodorants the morning of surgery.  Please wear clean clothes to the hospital/surgery center.  FAILURE TO FOLLOW THESE  INSTRUCTIONS MAY RESULT IN THE CANCELLATION OF YOUR SURGERY PATIENT SIGNATURE_________________________________  NURSE SIGNATURE__________________________________  ________________________________________________________________________

## 2018-04-17 ENCOUNTER — Inpatient Hospital Stay (HOSPITAL_COMMUNITY)
Admission: RE | Admit: 2018-04-17 | Discharge: 2018-04-17 | Disposition: A | Discharge status: Home / Self Care | Payer: Self-pay | Source: Ambulatory Visit

## 2018-04-23 ENCOUNTER — Encounter (HOSPITAL_COMMUNITY): Admission: RE | Payer: Self-pay | Source: Home / Self Care

## 2018-04-23 ENCOUNTER — Ambulatory Visit (HOSPITAL_COMMUNITY)
Admission: RE | Admit: 2018-04-23 | Payer: No Typology Code available for payment source | Source: Home / Self Care | Admitting: Urology

## 2018-04-23 SURGERY — CYSTOSCOPY/URETEROSCOPY/HOLMIUM LASER/STENT PLACEMENT
Anesthesia: General | Laterality: Right

## 2018-06-05 ENCOUNTER — Ambulatory Visit: Payer: Self-pay | Admitting: Internal Medicine

## 2018-06-08 ENCOUNTER — Other Ambulatory Visit: Payer: Self-pay | Admitting: Urology

## 2018-06-11 ENCOUNTER — Encounter (HOSPITAL_COMMUNITY): Payer: Self-pay

## 2018-06-11 NOTE — Progress Notes (Signed)
SPOKE W/  Andrew Stevens     SCREENING SYMPTOMS OF COVID 19:   COUGH--NO  RUNNY NOSE--- NO  SORE THROAT---NO  NASAL CONGESTION----NO  SNEEZING----NO  SHORTNESS OF BREATH---NO  DIFFICULTY BREATHING---NO  TEMP >100.0 -----NO  UNEXPLAINED BODY ACHES------NO  CHILLS -------- NO  HEADACHES ---------NO  LOSS OF SMELL/ TASTE --------NO    HAVE YOU OR ANY FAMILY MEMBER TRAVELLED PAST 14 DAYS OUT OF THE   COUNTY---live in [place]  STATE----NO COUNTRY----NO  HAVE YOU OR ANY FAMILY MEMBER BEEN EXPOSED TO ANYONE WITH COVID 19? NO

## 2018-06-11 NOTE — Pre-Procedure Instructions (Signed)
EKG 01/13/2018 in epic

## 2018-06-11 NOTE — Patient Instructions (Addendum)
DUE TO COVID-19 NO VISITORS ARE ALLOWED IN THE HOSPITAL AT THIS TIME   COVID SWAB TESTING MUST BE COMPLETED ON: Monday, Jun 15, 2018 at 1050AM   Your procedure is scheduled on: Thursday, Jun 18, 2018   Surgery Time:  1:00PM-2:30PM   Report to [facility]  Entrance    Report to admitting at 11:00 AM   Call this number if you have problems the morning of surgery [phone]   Do not eat food:After Midnight.   May have liquids until 7:00AM day of surgery    CLEAR LIQUID DIET  Foods Allowed                                                                     Foods Excluded  Water, Black Coffee and tea, regular and decaf                             liquids that you cannot  Plain Jell-O in any flavor                                             see through such as: Fruit ices (not with fruit pulp)                                     milk, soups, orange juice  Iced Popsicles                                    All solid food Carbonated beverages, regular and diet                                    Cranberry, grape and apple juices Sports drinks like Gatorade Lightly seasoned clear broth or consume(fat free) Sugar, honey syrup  Sample Menu Breakfast                                Lunch                                     Supper Cranberry juice                    Beef broth                            Chicken broth Jell-O                                     Grape juice  Apple juice Coffee or tea                        Jell-O                                      Popsicle                                                Coffee or tea                        Coffee or tea   Brush your teeth the morning of surgery.   Do NOT smoke after Midnight   Take these medicines the morning of surgery with A SIP OF WATER: Amlodipine   Do NOT take Cialis 24 hours prior to surgery.  DO NOT TAKE ANY DIABETIC MEDICATIONS DAY OF YOUR SURGERY                                You may not have any metal on your body including jewelry, and body piercings             Do not wear lotions, powders, perfumes/cologne, or deodorant                         Men may shave face and neck.   Do not bring valuables to the hospital. [place].   Contacts, dentures or bridgework may not be worn into surgery.    Patients discharged the day of surgery will not be allowed to drive home.   Name and phone number of your driver:   Special Instructions: Bring a copy of your healthcare power of attorney and living will documents         the day of surgery if you haven't scanned them in before.              Please read over the following fact sheets you were given:  [facility] - Preparing for Surgery Before surgery, you can play an important role.  Because skin is not sterile, your skin needs to be as free of germs as possible.  You can reduce the number of germs on your skin by washing with CHG (chlorahexidine gluconate) soap before surgery.  CHG is an antiseptic cleaner which kills germs and bonds with the skin to continue killing germs even after washing. Please DO NOT use if you have an allergy to CHG or antibacterial soaps.  If your skin becomes reddened/irritated stop using the CHG and inform your nurse when you arrive at Short Stay. Do not shave (including legs and underarms) for at least 48 hours prior to the first CHG shower.  You may shave your face/neck.  Please follow these instructions carefully:  1.  Shower with CHG Soap the night before surgery and the  morning of surgery.  2.  If you choose to wash your hair, wash your hair first as usual with your normal  shampoo.  3.  After you shampoo, rinse your  hair and body thoroughly to remove the shampoo.                             4.  Use CHG as you would any other liquid soap.  You can apply chg directly to the skin and wash.  Gently with a scrungie or clean  washcloth.  5.  Apply the CHG Soap to your body ONLY FROM THE NECK DOWN.   Do   not use on face/ open                           Wound or open sores. Avoid contact with eyes, ears mouth and   genitals (private parts).                       Wash face,  Genitals (private parts) with your normal soap.             6.  Wash thoroughly, paying special attention to the area where your    surgery  will be performed.  7.  Thoroughly rinse your body with warm water from the neck down.  8.  DO NOT shower/wash with your normal soap after using and rinsing off the CHG Soap.                9.  Pat yourself dry with a clean towel.            10.  Wear clean pajamas.            11.  Place clean sheets on your bed the night of your first shower and do not  sleep with pets. Day of Surgery : Do not apply any lotions/deodorants the morning of surgery.  Please wear clean clothes to the hospital/surgery center.  FAILURE TO FOLLOW THESE INSTRUCTIONS MAY RESULT IN THE CANCELLATION OF YOUR SURGERY  PATIENT SIGNATURE_________________________________  NURSE SIGNATURE__________________________________  ________________________________________________________________________

## 2018-06-12 ENCOUNTER — Encounter (HOSPITAL_COMMUNITY): Payer: Self-pay

## 2018-06-12 ENCOUNTER — Other Ambulatory Visit: Payer: Self-pay

## 2018-06-12 ENCOUNTER — Encounter (HOSPITAL_COMMUNITY)
Admission: RE | Admit: 2018-06-12 | Discharge: 2018-06-12 | Disposition: A | Discharge status: Home / Self Care | Payer: No Typology Code available for payment source | Source: Ambulatory Visit | Attending: Urology | Admitting: Urology

## 2018-06-12 DIAGNOSIS — N200 Calculus of kidney: Secondary | ICD-10-CM | POA: Insufficient documentation

## 2018-06-12 DIAGNOSIS — Z1159 Encounter for screening for other viral diseases: Secondary | ICD-10-CM | POA: Insufficient documentation

## 2018-06-12 DIAGNOSIS — N35919 Unspecified urethral stricture, male, unspecified site: Secondary | ICD-10-CM | POA: Diagnosis not present

## 2018-06-12 DIAGNOSIS — Z01812 Encounter for preprocedural laboratory examination: Secondary | ICD-10-CM | POA: Insufficient documentation

## 2018-06-12 HISTORY — DX: Headache, unspecified: R51.9

## 2018-06-12 HISTORY — DX: Gastro-esophageal reflux disease without esophagitis: K21.9

## 2018-06-12 HISTORY — DX: Personal history of other specified conditions: Z87.898

## 2018-06-12 HISTORY — DX: Personal history of other diseases of the digestive system: Z87.19

## 2018-06-12 LAB — CBC
HCT: 43.8 % (ref 39.0–52.0)
Hemoglobin: 14.4 g/dL (ref 13.0–17.0)
MCH: 29.7 pg (ref 26.0–34.0)
MCHC: 32.9 g/dL (ref 30.0–36.0)
MCV: 90.3 fL (ref 80.0–100.0)
Platelets: 322 10*3/uL (ref 150–400)
RBC: 4.85 MIL/uL (ref 4.22–5.81)
RDW: 12.7 % (ref 11.5–15.5)
WBC: 8.2 10*3/uL (ref 4.0–10.5)
nRBC: 0 % (ref 0.0–0.2)

## 2018-06-12 LAB — BASIC METABOLIC PANEL
Anion gap: 9 (ref 5–15)
BUN: 17 mg/dL (ref 6–20)
CO2: 27 mmol/L (ref 22–32)
Calcium: 9.2 mg/dL (ref 8.9–10.3)
Chloride: 102 mmol/L (ref 98–111)
Creatinine, Ser: 1.17 mg/dL (ref 0.61–1.24)
GFR calc Af Amer: >60 mL/min (ref >60)
GFR calc non Af Amer: >60 mL/min (ref >60)
Glucose, Bld: 125 mg/dL — ABNORMAL HIGH (ref 70–99)
Potassium: 3.9 mmol/L (ref 3.5–5.1)
Sodium: 138 mmol/L (ref 135–145)

## 2018-06-12 LAB — GLUCOSE, CAPILLARY: Glucose-Capillary: 114 mg/dL — ABNORMAL HIGH (ref 70–99)

## 2018-06-12 LAB — HEMOGLOBIN A1C
Hgb A1c MFr Bld: 6.2 % — ABNORMAL HIGH (ref 4.8–5.6)
Mean Plasma Glucose: 131.24 mg/dL

## 2018-06-15 ENCOUNTER — Other Ambulatory Visit (HOSPITAL_COMMUNITY)
Admission: RE | Admit: 2018-06-15 | Discharge: 2018-06-15 | Disposition: A | Discharge status: Home / Self Care | Payer: No Typology Code available for payment source | Source: Ambulatory Visit | Attending: Urology | Admitting: Urology

## 2018-06-15 DIAGNOSIS — Z01812 Encounter for preprocedural laboratory examination: Secondary | ICD-10-CM | POA: Diagnosis not present

## 2018-06-16 LAB — NOVEL CORONAVIRUS, NAA (HOSP ORDER, SEND-OUT TO REF LAB; TAT 18-24 HRS): SARS-CoV-2, NAA: NOT DETECTED

## 2018-06-17 NOTE — H&P (Signed)
CC/HPI:  1. 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 John Giovanni. He has a history of urolithiasis status post shockwave lithotripsy treatment in 1989. He developed a large right proximal ureteral stone in the spring of 2019 and underwent ureteroscopic laser lithotripsy for what turned out to be an impacted 1.9 x 1.0 proximal right ureteral stone. He had developed some intermittent right-sided flank pain and underwent a CT stone study on 12/12/2017. This demonstrated moderate right-sided hydronephrosis with a 3 mm right proximal ureteral stone at approximately the level of the UPJ. He also had a cluster nonobstructing lower pole stones with the largest measuring 12 mm. Left-sided nonobstructing multiple calculi were also seen with relatively large burden including a 13 mm stone in the left lower pole. He was subsequently taken to the operating room on 01/23/2018 by Dr. Nickolas Madrid and underwent right retrograde pyelography with attempted ureteroscopic treatment of his stone. He was noted to have a proximal right ureteral stricture measuring approximately 1 cm per the operative note. Ureteroscopy could not be completed his but he was able to undergo ureteral stent placement with an 8 x 26 double-J ureteral stent. He has expected stent symptoms including urinary urgency and frequency. He will occasionally have hematuria. He has denied any recent fever.      ALLERGIES: None   MEDICATIONS: Lipitor  Metformin Hcl  Amlodipine Besylate  Chlorthalidone  Micardis     GU PSH: Cysto Uretero Lithotripsy Cystoscopy Ureteroscopy      PSH Notes: Colloid cyst removal from head 1989   NON-GU PSH: Hernia Repair    GU PMH: None   NON-GU PMH: Hypertension    FAMILY HISTORY: None    Notes: 2 daughters    SOCIAL HISTORY: Marital Status: Married Preferred Language: English; Ethnicity: Not Hispanic  Or Latino; Race: Other Race Current Smoking Status: Patient has never smoked.   Tobacco Use Assessment Completed: Used Tobacco in last 30 days? Drinks 2 drinks per day.  Drinks 3 caffeinated drinks per day.    REVIEW OF SYSTEMS:    GU Review Male:   Patient reports frequent urination and get up at night to urinate. Patient denies hard to postpone urination, burning/ pain with urination, leakage of urine, stream starts and stops, trouble starting your streams, and have to strain to urinate .  Gastrointestinal (Upper):   Patient denies nausea and vomiting.  Gastrointestinal (Lower):   Patient denies diarrhea and constipation.  Constitutional:   Patient denies fever, night sweats, weight loss, and fatigue.  Skin:   Patient denies skin rash/ lesion and itching.  Eyes:   Patient denies blurred vision and double vision.  Ears/ Nose/ Throat:   Patient denies sore throat and sinus problems.  Hematologic/Lymphatic:   Patient denies swollen glands and easy bruising.  Cardiovascular:   Patient denies leg swelling and chest pains.  Respiratory:   Patient denies cough and shortness of breath.  Endocrine:   Patient denies excessive thirst.  Musculoskeletal:   Patient denies back pain and joint pain.  Neurological:   Patient denies headaches and dizziness.  Psychologic:   Patient denies depression and anxiety.   VITAL SIGNS:     Height 70 in / 177.8 cm  BMI 28.4 kg/m   MULTI-SYSTEM PHYSICAL EXAMINATION:    Constitutional: Well-nourished. No physical deformities. Normally developed. Good grooming.  Neck: Neck symmetrical, not swollen. Normal tracheal position.  Respiratory: No labored breathing, no use of accessory muscles. Clear bilaterally.  Cardiovascular: Normal temperature, normal extremity pulses, no swelling, no varicosities. Regular rate and rhythm.  Lymphatic: No enlargement of neck, axillae, groin.  Skin: No paleness, no jaundice, no cyanosis. No lesion, no ulcer, no rash.  Neurologic  / Psychiatric: Oriented to time, oriented to place, oriented to person. No depression, no anxiety, no agitation.  Gastrointestinal: No mass, no tenderness, no rigidity, non obese abdomen. Small upper midline incision. Small periumbilical incision.  Eyes: Normal conjunctivae. Normal eyelids.  Ears, Nose, Mouth, and Throat: Left ear no scars, no lesions, no masses. Right ear no scars, no lesions, no masses. Nose no scars, no lesions, no masses. Normal hearing. Normal lips.  Musculoskeletal: Normal gait and station of head and neck.       ASSESSMENT:      ICD-10 Details  1 GU:   Renal and ureteral calculus - N20.2   2   Ureteral stricture - N13.5     PLAN:       1. Proximal right ureteral stricture with proximal right ureteral stone and renal calculi: His stent remains indwelling. I have recommended that we proceed with further endoscopic evaluation including cystoscopy and ureteroscopy to re-evaluate the previously noted stricture. Following stent placement, I am hopeful that we might be able to complete his treatment of his remaining 3 mm stone. If amenable, I would then plan to endoscopically treat his remaining lower pole renal stones as well. He understands that he will require a ureteral stent at least temporarily postoperatively. If he does have a fairly significant stricture, and if his stones are unable to be treated, he understands that we may need to continue with stent management with plans to consider reconstructive surgery. We also discussed proceeding with a functional study a with a renogram prior to any major reconstructive procedure.   Currently, I have recommended that he proceed with cystoscopy, right ureteroscopy, possible laser lithotripsy and stone removal, possible right ureteral stent replacement. If successful, he will then follow up with a Lasix renal scan and anatomic imaging such as a renal ultrasound to assess resolution of his obstruction. If unsuccessful, his stent  will be replaced and we will proceed with a renogram for functional evaluation and will consider inappropriate reconstructive procedure pending intraoperative findings based on the length and location of his stricture. We briefly discussed the possibility of a buccal mucosal graft if needed.

## 2018-06-17 NOTE — Progress Notes (Addendum)
Left message for patient to call back.  Spoke with patient via phone after he called back     [place]:   COUGH--no  RUNNY NOSE--- no  SORE THROAT---no  NASAL CONGESTION----no  SNEEZING----no  SHORTNESS OF BREATH---no  DIFFICULTY BREATHING---no  TEMP >100.0 -----no  UNEXPLAINED BODY ACHES------no  CHILLS -------- no  HEADACHES ---------no  LOSS OF SMELL/ TASTE --------no    HAVE YOU OR ANY FAMILY MEMBER TRAVELLED PAST 14 DAYS OUT OF THE   COUNTY---no STATE----no COUNTRY----no  HAVE YOU OR ANY FAMILY MEMBER BEEN EXPOSED TO ANYONE WITH COVID 19? no

## 2018-06-18 ENCOUNTER — Encounter (HOSPITAL_COMMUNITY): Payer: Self-pay

## 2018-06-18 ENCOUNTER — Ambulatory Visit (HOSPITAL_COMMUNITY): Payer: No Typology Code available for payment source | Admitting: Anesthesiology

## 2018-06-18 ENCOUNTER — Ambulatory Visit (HOSPITAL_COMMUNITY): Payer: No Typology Code available for payment source

## 2018-06-18 ENCOUNTER — Ambulatory Visit (HOSPITAL_COMMUNITY)
Admission: RE | Admit: 2018-06-18 | Discharge: 2018-06-18 | Disposition: A | Discharge status: Home / Self Care | Payer: No Typology Code available for payment source | Attending: Urology | Admitting: Urology

## 2018-06-18 ENCOUNTER — Ambulatory Visit (HOSPITAL_COMMUNITY): Payer: No Typology Code available for payment source | Admitting: Physician Assistant

## 2018-06-18 ENCOUNTER — Encounter (HOSPITAL_COMMUNITY)
Admission: RE | Disposition: A | Discharge status: Home / Self Care | Payer: Self-pay | Source: Home / Self Care | Attending: Urology

## 2018-06-18 DIAGNOSIS — I10 Essential (primary) hypertension: Secondary | ICD-10-CM | POA: Insufficient documentation

## 2018-06-18 DIAGNOSIS — Z79899 Other long term (current) drug therapy: Secondary | ICD-10-CM | POA: Diagnosis not present

## 2018-06-18 DIAGNOSIS — Z7984 Long term (current) use of oral hypoglycemic drugs: Secondary | ICD-10-CM | POA: Insufficient documentation

## 2018-06-18 DIAGNOSIS — N131 Hydronephrosis with ureteral stricture, not elsewhere classified: Secondary | ICD-10-CM | POA: Insufficient documentation

## 2018-06-18 DIAGNOSIS — E119 Type 2 diabetes mellitus without complications: Secondary | ICD-10-CM | POA: Diagnosis not present

## 2018-06-18 DIAGNOSIS — Z87442 Personal history of urinary calculi: Secondary | ICD-10-CM | POA: Insufficient documentation

## 2018-06-18 DIAGNOSIS — N202 Calculus of kidney with calculus of ureter: Secondary | ICD-10-CM | POA: Diagnosis present

## 2018-06-18 HISTORY — PX: CYSTOSCOPY/URETEROSCOPY/HOLMIUM LASER/STENT PLACEMENT: SHX6546

## 2018-06-18 LAB — GLUCOSE, CAPILLARY
Glucose-Capillary: 124 mg/dL — ABNORMAL HIGH (ref 70–99)
Glucose-Capillary: 112 mg/dL — ABNORMAL HIGH (ref 70–99)

## 2018-06-18 SURGERY — CYSTOSCOPY/URETEROSCOPY/HOLMIUM LASER/STENT PLACEMENT
Anesthesia: General | Laterality: Right

## 2018-06-18 MED ORDER — 0.9 % SODIUM CHLORIDE (POUR BTL) OPTIME
TOPICAL | Status: DC | PRN
  Administered 2018-06-18: 13:00:00 1000 mL

## 2018-06-18 MED ORDER — LACTATED RINGERS IV SOLN
INTRAVENOUS | Status: DC
  Administered 2018-06-18 (×2): via INTRAVENOUS

## 2018-06-18 MED ORDER — PROPOFOL 10 MG/ML IV BOLUS
INTRAVENOUS | Status: DC | PRN
  Administered 2018-06-18: 200 mg via INTRAVENOUS

## 2018-06-18 MED ORDER — IOHEXOL 300 MG/ML  SOLN
INTRAMUSCULAR | Status: DC | PRN
  Administered 2018-06-18: 6 mL

## 2018-06-18 MED ORDER — ACETAMINOPHEN 500 MG PO TABS
1000.0000 mg | ORAL_TABLET | Freq: Once | ORAL | Status: AC
  Administered 2018-06-18: 11:00:00 1000 mg via ORAL
  Filled 2018-06-18: qty 2

## 2018-06-18 MED ORDER — PROPOFOL 10 MG/ML IV BOLUS
INTRAVENOUS | Status: AC
  Filled 2018-06-18: qty 20

## 2018-06-18 MED ORDER — FENTANYL CITRATE (PF) 100 MCG/2ML IJ SOLN: 25.0000 ug | INTRAMUSCULAR | Status: DC | PRN

## 2018-06-18 MED ORDER — MIDAZOLAM HCL 2 MG/2ML IJ SOLN
INTRAMUSCULAR | Status: AC
  Filled 2018-06-18: qty 2

## 2018-06-18 MED ORDER — MIDAZOLAM HCL 5 MG/5ML IJ SOLN
INTRAMUSCULAR | Status: DC | PRN
  Administered 2018-06-18: 2 mg via INTRAVENOUS

## 2018-06-18 MED ORDER — EPHEDRINE SULFATE-NACL 50-0.9 MG/10ML-% IV SOSY
PREFILLED_SYRINGE | INTRAVENOUS | Status: DC | PRN
  Administered 2018-06-18: 10 mg via INTRAVENOUS

## 2018-06-18 MED ORDER — LIDOCAINE 2% (20 MG/ML) 5 ML SYRINGE
INTRAMUSCULAR | Status: DC | PRN
  Administered 2018-06-18: 80 mg via INTRAVENOUS

## 2018-06-18 MED ORDER — PHENAZOPYRIDINE HCL 200 MG PO TABS: 200.0000 mg | ORAL_TABLET | Freq: Three times a day (TID) | ORAL | 0 refills | Status: DC | PRN

## 2018-06-18 MED ORDER — FENTANYL CITRATE (PF) 100 MCG/2ML IJ SOLN
INTRAMUSCULAR | Status: DC | PRN
  Administered 2018-06-18: 50 ug via INTRAVENOUS
  Administered 2018-06-18 (×2): 25 ug via INTRAVENOUS

## 2018-06-18 MED ORDER — TRAMADOL HCL 50 MG PO TABS: 50.0000 mg | ORAL_TABLET | Freq: Four times a day (QID) | ORAL | 0 refills | Status: DC | PRN

## 2018-06-18 MED ORDER — FENTANYL CITRATE (PF) 100 MCG/2ML IJ SOLN
INTRAMUSCULAR | Status: AC
  Filled 2018-06-18: qty 2

## 2018-06-18 MED ORDER — CEFAZOLIN SODIUM-DEXTROSE 2-4 GM/100ML-% IV SOLN
2.0000 g | Freq: Once | INTRAVENOUS | Status: AC
  Administered 2018-06-18: 13:00:00 2 g via INTRAVENOUS
  Filled 2018-06-18: qty 100

## 2018-06-18 MED ORDER — SODIUM CHLORIDE 0.9 % IR SOLN
Status: DC | PRN
  Administered 2018-06-18: 3000 mL

## 2018-06-18 SURGICAL SUPPLY — 20 items
BAG URO CATCHER STRL LF (MISCELLANEOUS) ×2 IMPLANT
BASKET ZERO TIP NITINOL 2.4FR (BASKET) ×2 IMPLANT
CATH INTERMIT  6FR 70CM (CATHETERS) ×2 IMPLANT
CLOTH BEACON ORANGE TIMEOUT ST (SAFETY) ×2 IMPLANT
COVER WAND RF STERILE (DRAPES) IMPLANT
FIBER LASER FLEXIVA 365 (UROLOGICAL SUPPLIES) IMPLANT
FIBER LASER TRAC TIP (UROLOGICAL SUPPLIES) ×2 IMPLANT
GLOVE BIOGEL M STRL SZ7.5 (GLOVE) ×2 IMPLANT
GOWN STRL REUS W/TWL LRG LVL3 (GOWN DISPOSABLE) ×4 IMPLANT
GUIDEWIRE ANG ZIPWIRE 038X150 (WIRE) IMPLANT
GUIDEWIRE STR DUAL SENSOR (WIRE) ×2 IMPLANT
IV NS 1000ML (IV SOLUTION) ×1
IV NS 1000ML BAXH (IV SOLUTION) ×1 IMPLANT
KIT TURNOVER KIT A (KITS) ×2 IMPLANT
MANIFOLD NEPTUNE II (INSTRUMENTS) ×2 IMPLANT
PACK CYSTO (CUSTOM PROCEDURE TRAY) ×2 IMPLANT
SHEATH URETERAL 12FRX35CM (MISCELLANEOUS) ×2 IMPLANT
STENT CONTOUR 8FR X 24 (STENTS) ×2 IMPLANT
TUBING CONNECTING 10 (TUBING) ×2 IMPLANT
TUBING UROLOGY SET (TUBING) ×2 IMPLANT

## 2018-06-18 NOTE — Transfer of Care (Signed)
Immediate Anesthesia Transfer of Care Note  Patient: Andrew Stevens  Procedure(s) Performed: CYSTOSCOPY/ RETROGRADE//URETEROSCOPY/HOLMIUM LASER/STENT PLACEMENT (Right )  Patient Location: PACU  Anesthesia Type:General  Level of Consciousness: awake and patient cooperative  Airway & Oxygen Therapy: Patient Spontanous Breathing and Patient connected to face mask  Post-op Assessment: Report given to RN and Post -op Vital signs reviewed and stable  Post vital signs: Reviewed and stable  Last Vitals:  Vitals Value Taken Time  BP 133/82 06/18/2018  2:20 PM  Temp    Pulse 77 06/18/2018  2:21 PM  Resp 10 06/18/2018  2:21 PM  SpO2 100 % 06/18/2018  2:21 PM  Vitals shown include unvalidated device data.  Last Pain:  Vitals:   06/18/18 1100  TempSrc:   PainSc: 0-No pain         Complications: No apparent anesthesia complications

## 2018-06-18 NOTE — Discharge Instructions (Signed)

## 2018-06-18 NOTE — Anesthesia Procedure Notes (Signed)
Procedure Name: LMA Insertion Performed by: Gean Maidens, CRNA Pre-anesthesia Checklist: Patient identified, Emergency Drugs available, Suction available, Patient being monitored and Timeout performed Patient Re-evaluated:Patient Re-evaluated prior to induction Oxygen Delivery Method: Circle system utilized Preoxygenation: Pre-oxygenation with 100% oxygen Induction Type: IV induction Ventilation: Mask ventilation without difficulty LMA Size: 5.0 Number of attempts: 2 Placement Confirmation: positive ETCO2 and breath sounds checked- equal and bilateral Tube secured with: Tape Dental Injury: Teeth and Oropharynx as per pre-operative assessment

## 2018-06-18 NOTE — Anesthesia Preprocedure Evaluation (Addendum)
Anesthesia Evaluation  Patient identified by MRN, date of birth, ID band Patient awake    Reviewed: Allergy & Precautions, H&P , NPO status , Patient's Chart, lab work & pertinent test results  History of Anesthesia Complications (+) DIFFICULT AIRWAY  Airway Mallampati: II  TM Distance: >3 FB Neck ROM: Full    Dental no notable dental hx. (+) Teeth Intact, Dental Advisory Given   Pulmonary neg pulmonary ROS,    Pulmonary exam normal breath sounds clear to auscultation       Cardiovascular hypertension, Pt. on medications  Rhythm:Regular Rate:Normal     Neuro/Psych  Headaches, Anxiety    GI/Hepatic Neg liver ROS, GERD  Controlled,  Endo/Other  diabetes, Type 2, Oral Hypoglycemic Agents  Renal/GU Renal disease  negative genitourinary   Musculoskeletal   Abdominal   Peds  Hematology negative hematology ROS (+)   Anesthesia Other Findings   Reproductive/Obstetrics negative OB ROS                            Anesthesia Physical Anesthesia Plan  ASA: II  Anesthesia Plan: General   Post-op Pain Management:    Induction: Intravenous  PONV Risk Score and Plan: 3 and Ondansetron, Dexamethasone and Midazolam  Airway Management Planned: LMA  Additional Equipment:   Intra-op Plan:   Post-operative Plan: Extubation in OR  Informed Consent: I have reviewed the patients History and Physical, chart, labs and discussed the procedure including the risks, benefits and alternatives for the proposed anesthesia with the patient or authorized representative who has indicated his/her understanding and acceptance.     Dental advisory given  Plan Discussed with: CRNA  Anesthesia Plan Comments:         Anesthesia Quick Evaluation

## 2018-06-18 NOTE — Interval H&P Note (Signed)
History and Physical Interval Note:  06/18/2018 1:00 PM  Andrew Stevens  has presented today for surgery, with the diagnosis of RIGHT RENAL AND URETERAL CALCULI, RIGHT URETERAL STRICTURE.  The various methods of treatment have been discussed with the patient and family. After consideration of risks, benefits and other options for treatment, the patient has consented to  Procedure(s): CYSTOSCOPY/ RETROGRADE//URETEROSCOPY/HOLMIUM LASER/STENT PLACEMENT (Right) as a surgical intervention.  The patient's history has been reviewed, patient examined, no change in status, stable for surgery.  I have reviewed the patient's chart and labs.  Questions were answered to the patient's satisfaction.     [employer]

## 2018-06-18 NOTE — Anesthesia Postprocedure Evaluation (Signed)
Anesthesia Post Note  Patient: Andrew Stevens  Procedure(s) Performed: CYSTOSCOPY/ RETROGRADE//URETEROSCOPY/HOLMIUM LASER/STENT PLACEMENT (Right )     Patient location during evaluation: PACU Anesthesia Type: General Level of consciousness: awake and alert Pain management: pain level controlled Vital Signs Assessment: post-procedure vital signs reviewed and stable Respiratory status: spontaneous breathing, nonlabored ventilation and respiratory function stable Cardiovascular status: blood pressure returned to baseline and stable Postop Assessment: no apparent nausea or vomiting Anesthetic complications: no    Last Vitals:  Vitals:   06/18/18 1500 06/18/18 1522  BP: (!) 137/92 138/87  Pulse: 63 65  Resp: 11 12  Temp:  (!) 36.4 C  SpO2: 100% 100%    Last Pain:  Vitals:   06/18/18 1522  TempSrc:   PainSc: 0-No pain                 Taesean Reth,W. EDMOND

## 2018-06-18 NOTE — Op Note (Signed)
Preoperative diagnosis: Right renal and ureteral calculi and proximal right ureteral stricture  Postoperative diagnosis: Right renal and ureteral calculi and right proximal ureteral stricture  Procedures: 1.  Cystoscopy 2.  Right retrograde pyelography with interpretation 3.  Right ureteroscopy with laser lithotripsy and stone removal 4.  Right ureteroscopic laser endopyelotomy 5.  Right ureteral stent placement (8 x 24 -no string)  Surgeon: Roxy Horseman, Brooke Bonito MD  Anesthesia: General  Complications: None  EBL: Minimal  Specimens: Right ureteral and renal calculi  Disposition of specimens: [facility]  Indication: Andrew Stevens is a 58 year old gentleman who was recently found to have right ureteral calculi.  He underwent attempted ureteroscopic treatment at an outside facility but was noted to have a severely impacted right proximal ureteral stone.  He underwent multiple procedures and eventually was able to have a ureteral stent placed.  He followed up for further evaluation and possibly to consider further treatment possibly requiring ureteral reconstruction.  After reviewing his situation, we discussed proceeding with endoscopic evaluation including retrograde pyelography, ureteroscopy, and possible laser lithotripsy of his stones as well as potential endoscopic treatment of a stricture if present.  The potential risks, complications, and expected recovery process was discussed in detail.  Informed consent was obtained.  Description of procedure: The patient was taken to the operating room and a general anesthetic was administered.  He was given preoperative antibiotics, placed in the dorsolithotomy position, and prepped and draped in usual sterile fashion.  A preoperative timeout was performed.  Cystourethroscopy was performed which demonstrated a normal anterior and posterior urethra.  Inspection of the bladder revealed an indwelling right ureteral stent with expected  edema around the right ureteral orifice.  No other abnormalities were noted.  Flexible graspers were used to bring the ureteral stent out to the urethral meatus.  A 0.38 sensor guidewire was then advanced up the stent into the renal pelvis under fluoroscopic guidance and the stent was removed.  A 6 French ureteral catheter was then advanced over the wire and the wire was removed.  Omnipaque contrast was injected and this demonstrated an area of slight narrowing in the proximal ureter but no evidence of complete obstruction or other significant filling defects.  Further evaluation demonstrated some dilation of the renal collecting system but no filling defects.  A 6 French semirigid ureteroscope was then advanced next to the wire after was replaced.  This was advanced up into the proximal ureter and no significant abnormalities were noted.  Therefore a ureteral access sheath was advanced over the wire up to the point in the ureter that had been visualized with the semirigid ureteroscope.  The flexible digital ureteroscope was then advanced through the ureteral access sheath and the proximal ureter was evaluated was normal up to the level of approximately the UPJ where there was slight narrowing.  However, the ureteroscope was able to be manipulated into the renal collecting system.  The entire renal collecting system was visually evaluated.  There was noted to be a calculus within the renal pelvis.  There was a second calculus in a lower pole calyx.  The lower pole calyx was able to be removed intact after the 0 tip nitinol basket.  The holmium laser was then used with a 200 m fiber to perform laser lithotripsy of the renal collecting system stone.  The stone fragments were removed with the 0 tip nitinol basket with only small, less than 2 mm, fragments left in place.  Attention then turned to the  ureteropelvic junction.  The ureteroscope was withdrawn to this point and it did appear that there was a stone that  appeared to be covered by ureteral mucosa medially.  Using the holmium laser fiber, the stone was fragmented into small pieces.  Furthermore, a laser endopyelotomy was performed to open the area of the ureter that appeared to be mildly strictured.  This revealed the UPJ would appear to be widely open.  The wire was left in place and the access sheath was removed along with the flexible ureteroscope.  The wire was then backloaded on the cystoscope and an 8 x 24 double-J ureteral stent was advanced over the wire using Seldinger technique.  This was positioned appropriately under fluoroscopic and cystoscopic guidance.  The wire was removed with a good curl noted in the renal pelvis as well as within the bladder.  Patient tolerated procedure well without complications.  He was able to be awakened and transferred to the recovery unit in satisfactory condition.

## 2018-06-19 ENCOUNTER — Encounter (HOSPITAL_COMMUNITY): Payer: Self-pay | Admitting: Urology

## 2018-07-09 ENCOUNTER — Other Ambulatory Visit: Payer: Self-pay | Admitting: Urology

## 2018-07-09 DIAGNOSIS — N135 Crossing vessel and stricture of ureter without hydronephrosis: Secondary | ICD-10-CM

## 2018-08-03 ENCOUNTER — Other Ambulatory Visit: Payer: Self-pay | Admitting: Family

## 2018-08-14 ENCOUNTER — Other Ambulatory Visit: Payer: Self-pay | Admitting: Internal Medicine

## 2018-08-14 MED ORDER — ATORVASTATIN CALCIUM 20 MG PO TABS: ORAL_TABLET | ORAL | 3 refills | Status: DC

## 2018-08-20 IMAGING — US US RENAL
1 series · 14 of 25 positions shown · non-contrast
Comparison: [facility] 02/21/2017.  CT abdomen pelvis 09/11/2009

CLINICAL DATA: Hematuria

EXAM:
RENAL / URINARY TRACT ULTRASOUND COMPLETE

[Series 1: us renal · 0.23mm/px · 14 of 54 slices shown]
[im 1/54]
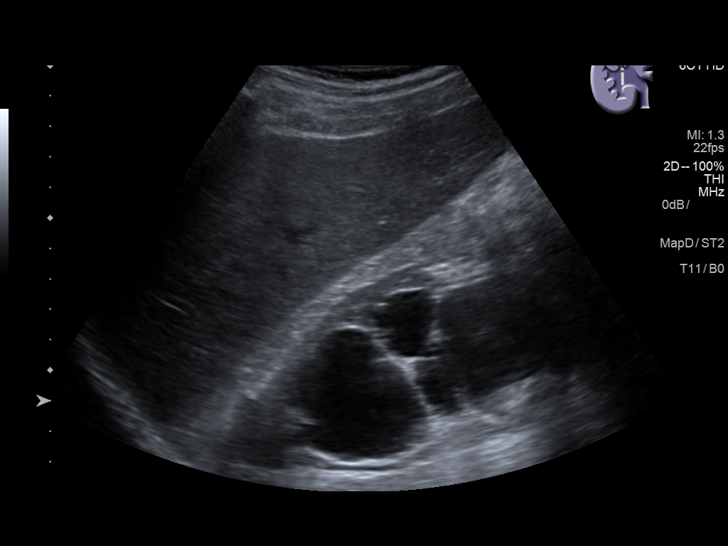
[im 5/54]
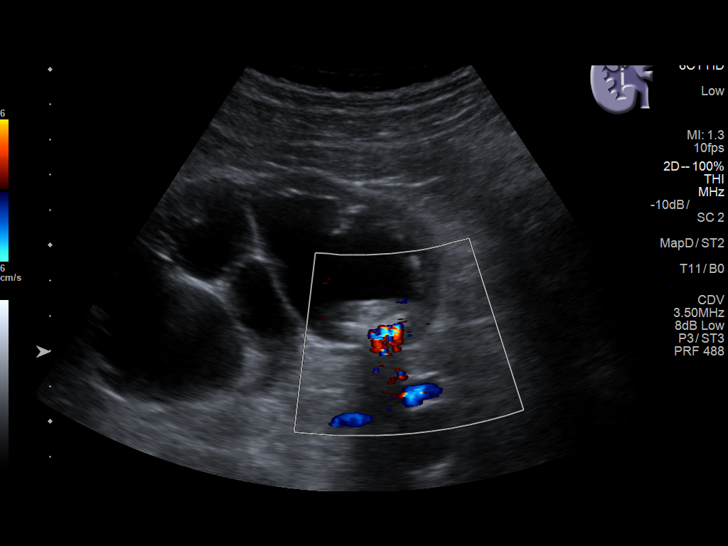
[im 9/54]
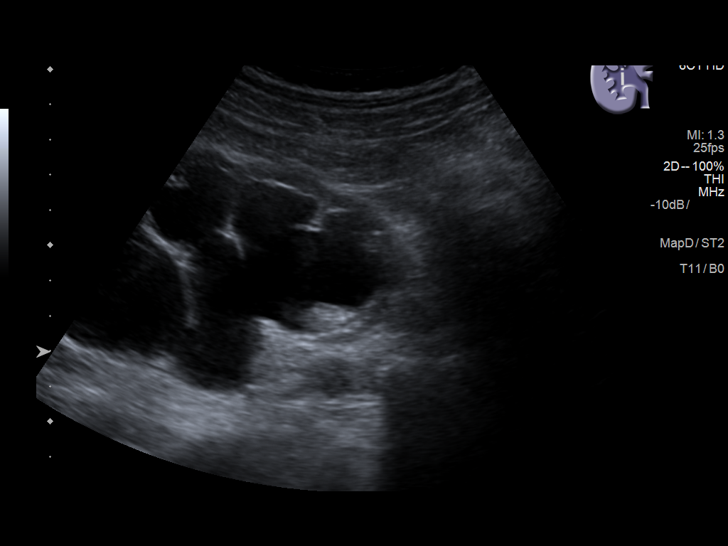
[im 14/54]
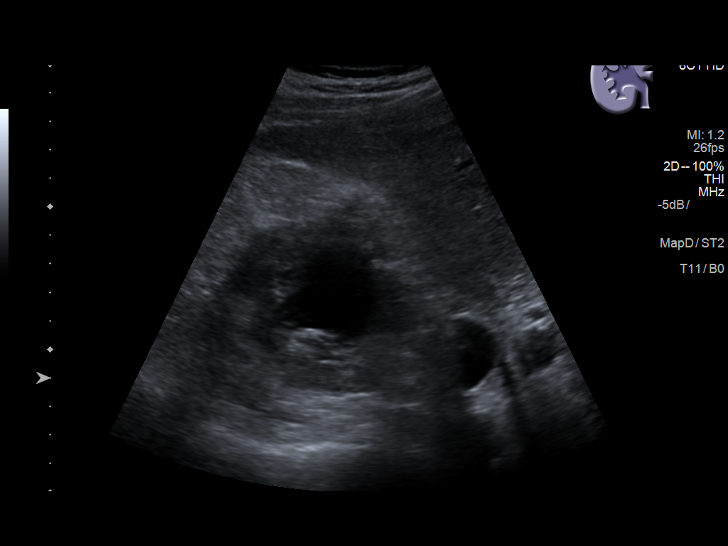
[im 18/54]
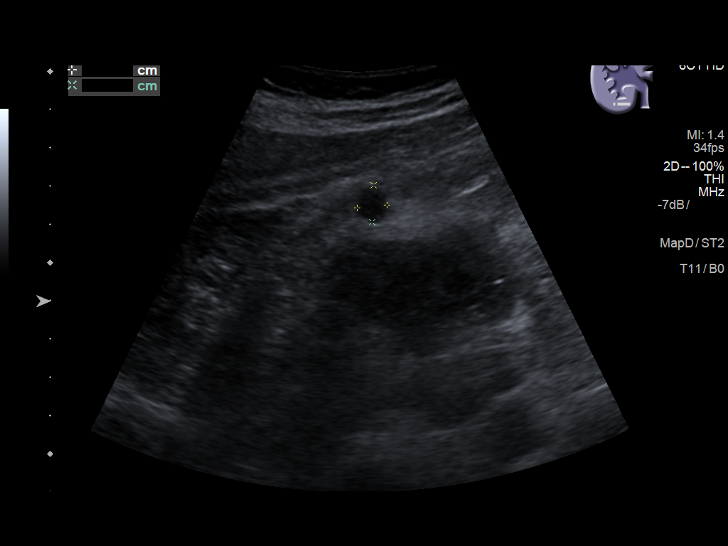
[im 20/54]
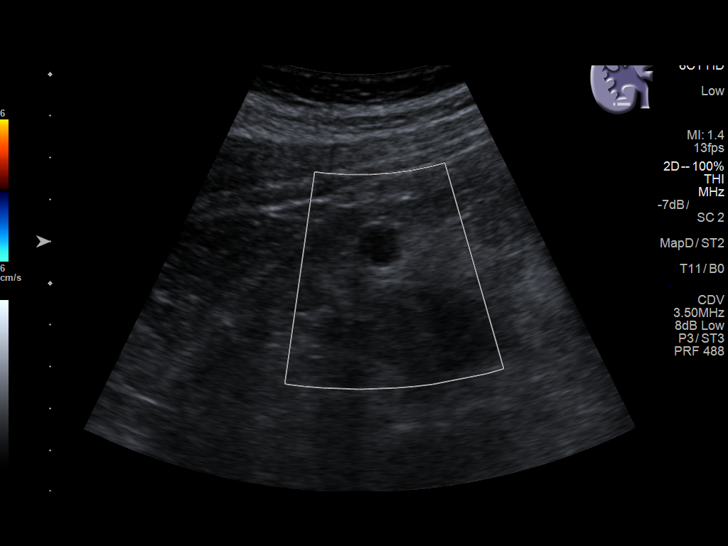
[im 25/54]
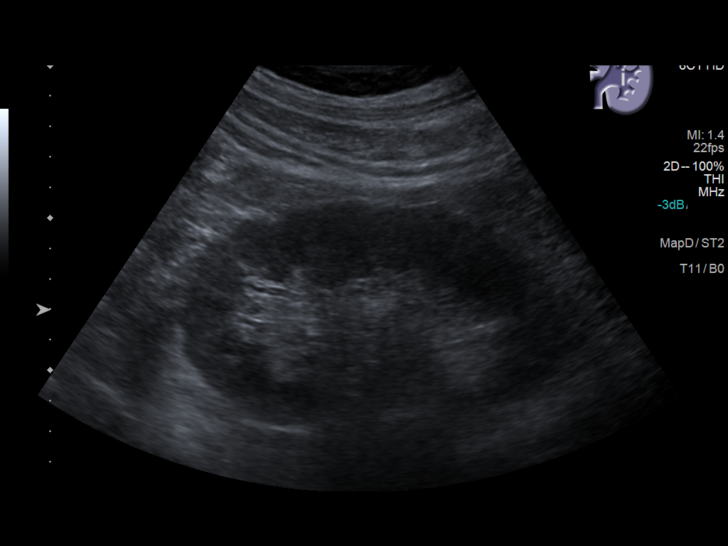
[im 29/54]
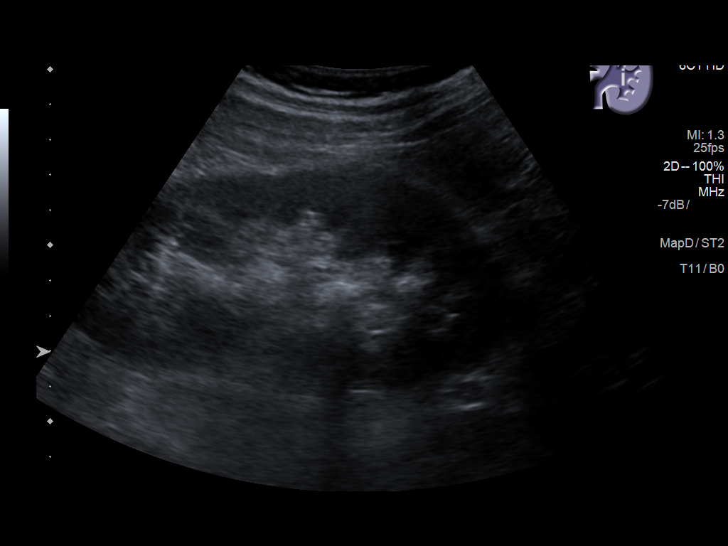
[im 34/54]
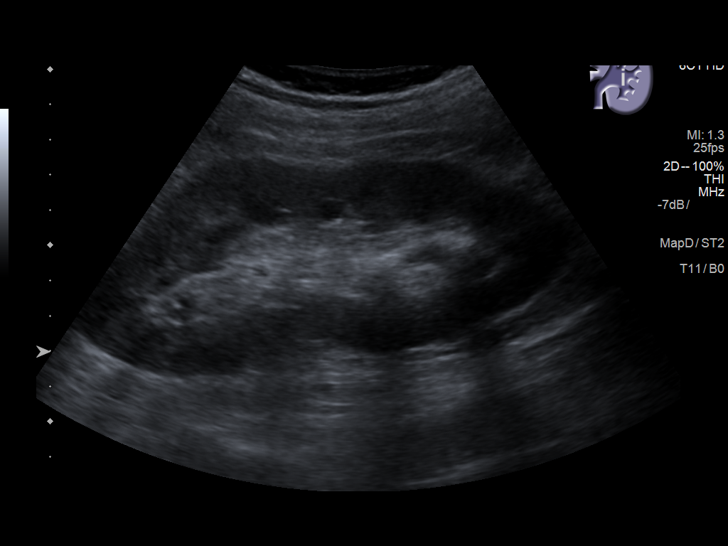
[im 36/54]
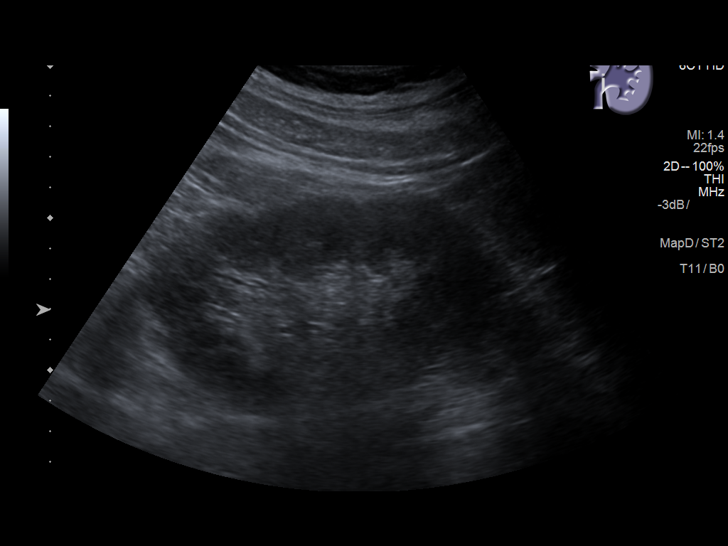
[im 40/54]
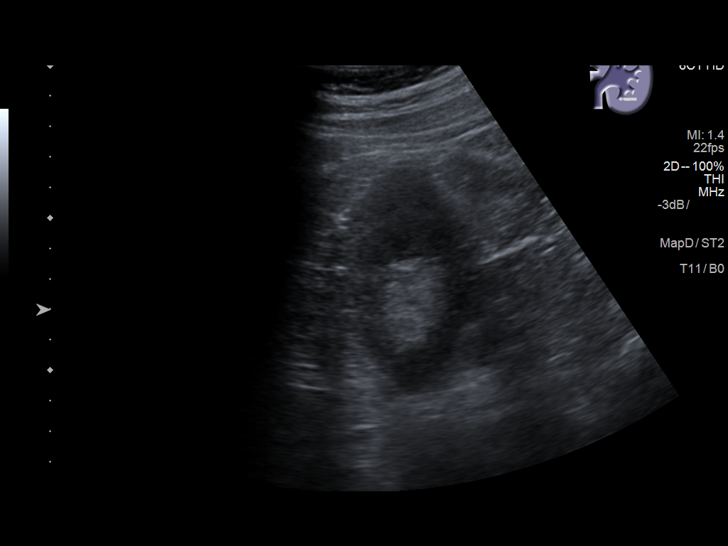
[im 45/54]
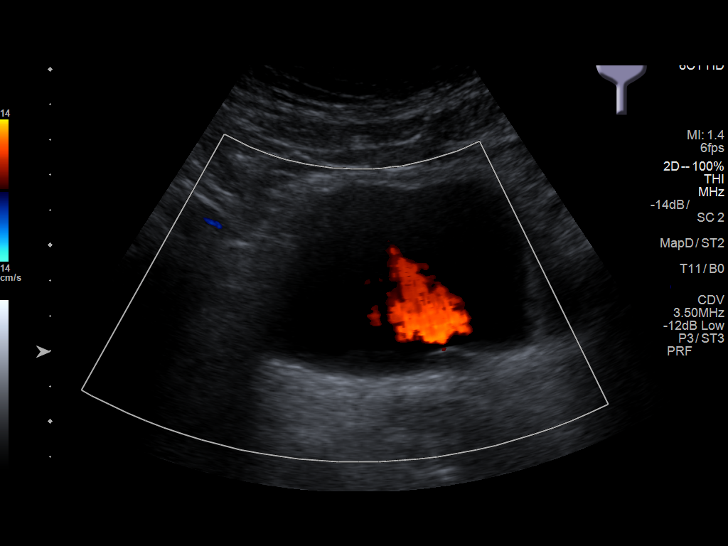
[im 49/54]
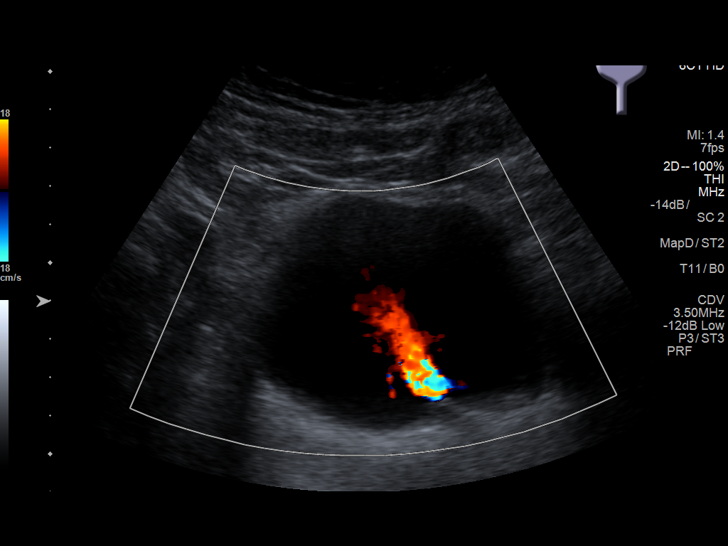
[im 54/54]
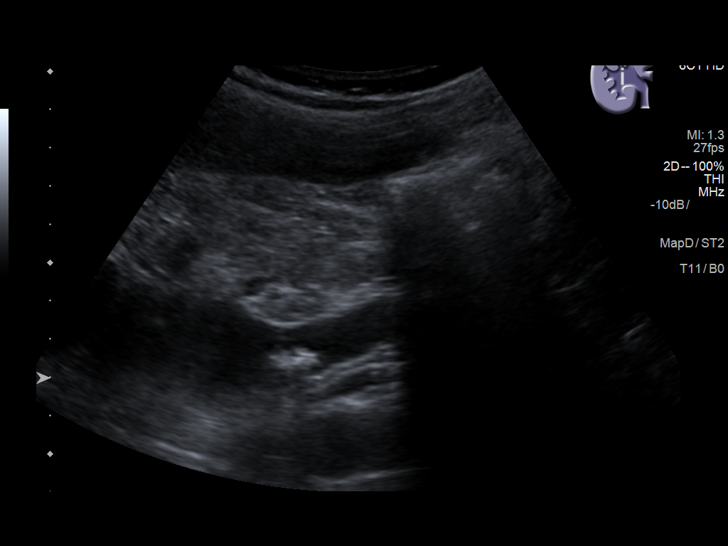

[14 of 25 positions shown; findings below may reference images not displayed]

FINDINGS: Right Kidney:

Length: 12.4 cm. Severe right hydronephrosis. 22 mm calculus in the
right lower pole collecting system. Dilated renal pelvis and ureter.
16 mm stone in the proximal left ureter. Cortical thinning.

Left Kidney:

Length: 12.8 cm. Echogenicity within normal limits. No mass or
hydronephrosis visualized. 13 mm left lower pole calculus

Bladder:

Bilateral ureteral jets.
IMPRESSION: Severe right hydronephrosis. 22 mm right lower pole calculus. 16 mm
stone proximal left ureter.

13 mm left lower pole calculus without obstruction.

## 2018-10-02 ENCOUNTER — Other Ambulatory Visit: Payer: Self-pay

## 2018-10-02 ENCOUNTER — Encounter (HOSPITAL_COMMUNITY)
Admission: RE | Admit: 2018-10-02 | Discharge: 2018-10-02 | Disposition: A | Discharge status: Home / Self Care | Payer: No Typology Code available for payment source | Source: Ambulatory Visit | Attending: Urology | Admitting: Urology

## 2018-10-02 DIAGNOSIS — N135 Crossing vessel and stricture of ureter without hydronephrosis: Secondary | ICD-10-CM | POA: Insufficient documentation

## 2018-10-02 MED ORDER — FUROSEMIDE 10 MG/ML IJ SOLN
INTRAMUSCULAR | Status: AC
  Filled 2018-10-02: qty 8

## 2018-10-02 MED ORDER — FUROSEMIDE 10 MG/ML IJ SOLN: 46.0000 mg | Freq: Once | INTRAMUSCULAR | Status: DC

## 2018-10-02 MED ORDER — TECHNETIUM TC 99M MERTIATIDE
5.2000 | Freq: Once | INTRAVENOUS | Status: AC | PRN
  Administered 2018-10-02: 5.2 via INTRAVENOUS

## 2018-10-13 ENCOUNTER — Other Ambulatory Visit: Payer: Self-pay | Admitting: Urology

## 2018-11-18 NOTE — Patient Instructions (Addendum)
DUE TO COVID-19 ONLY ONE VISITOR IS ALLOWED TO COME WITH YOU AND STAY IN THE WAITING ROOM ONLY DURING PRE OP AND PROCEDURE DAY OF SURGERY. THE 1 VISITOR MAY VISIT WITH YOU AFTER SURGERY IN YOUR PRIVATE ROOM DURING VISITING HOURS ONLY!  YOU NEED TO HAVE A COVID 19 TEST ON_Thursday 10/22/2020______ @___1030  am____, THIS TEST MUST BE DONE BEFORE SURGERY, COME  [place] , [id number].  ([place]) ONCE YOUR COVID TEST IS COMPLETED, PLEASE BEGIN THE QUARANTINE INSTRUCTIONS AS OUTLINED IN YOUR HANDOUT.                RANJIT GANEY  11/18/2018   Your procedure is scheduled on: MONDAY 11/23/2018   Report to [facility] Main  Entrance    Report to Short Stay at [place]  AM     Call this number if you have problems the morning of surgery [phone]   How to Manage Your Diabetes Before and After Surgery  Why is it important to control my blood sugar before and after surgery? . Improving blood sugar levels before and after surgery helps healing and can limit problems. . A way of improving blood sugar control is eating a healthy diet by: o  Eating less sugar and carbohydrates o  Increasing activity/exercise o  Talking with your doctor about reaching your blood sugar goals . High blood sugars (greater than 180 mg/dL) can raise your risk of infections and slow your recovery, so you will need to focus on controlling your diabetes during the weeks before surgery. . Make sure that the doctor who takes care of your diabetes knows about your planned surgery including the date and location.  How do I manage my blood sugar before surgery? . Check your blood sugar at least 4 times a day, starting 2 days before surgery, to make sure that the level is not too high or low. o Check your blood sugar the morning of your surgery when you wake up and every 2 hours until you get to the Short Stay unit. . If your blood sugar is less than 70 mg/dL, you will need to treat  for low blood sugar: o Do not take insulin. o Treat a low blood sugar (less than 70 mg/dL) with  cup of clear juice (cranberry or apple), 4 glucose tablets, OR glucose gel. o Recheck blood sugar in 15 minutes after treatment (to make sure it is greater than 70 mg/dL). If your blood sugar is not greater than 70 mg/dL on recheck, call [phone] for further instructions. . Report your blood sugar to the short stay nurse when you get to Short Stay.  . If you are admitted to the hospital after surgery: o Your blood sugar will be checked by the staff and you will probably be given insulin after surgery (instead of oral diabetes medicines) to make sure you have good blood sugar levels. o The goal for blood sugar control after surgery is 80-180 mg/dL.   WHAT DO I DO ABOUT MY DIABETES MEDICATION?         The day before surgery, Take Metformin as usual!  .  Do not take oral diabetes medicines (pills) the morning of surgery.      Remember: Do not eat food or drink liquids :After Midnight.     BRUSH YOUR TEETH MORNING OF SURGERY AND RINSE YOUR MOUTH OUT, NO CHEWING GUM CANDY OR MINTS.     Take these medicines the morning of surgery  with A SIP OF WATER: Amlodipine (Norvasc)   DO NOT TAKE ANY DIABETIC MEDICATIONS DAY OF YOUR SURGERY!                               You may not have any metal on your body including hair pins and              piercings  Do not wear jewelry, make-up, lotions, powders or perfumes, deodorant                          Men may shave face and neck.   Do not bring valuables to the hospital. [place].  Contacts, dentures or bridgework may not be worn into surgery.  Leave suitcase in the car. After surgery it may be brought to your room.     Patients discharged the day of surgery will not be allowed to drive home. IF YOU ARE HAVING SURGERY AND GOING HOME THE SAME DAY, YOU MUST HAVE AN ADULT TO DRIVE YOU HOME AND  BE  WITH YOU FOR 24 HOURS. YOU MAY GO HOME BY TAXI OR UBER OR ORTHERWISE, BUT AN ADULT MUST ACCOMPANY YOU HOME AND STAY WITH YOU FOR 24 HOURS.  Name and phone number of your driver: spouseSunnie Nielsen [phone]                Please read over the following fact sheets you were given: _____________________________________________________________________             [facility] - Preparing for Surgery Before surgery, you can play an important role.  Because skin is not sterile, your skin needs to be as free of germs as possible.  You can reduce the number of germs on your skin by washing with CHG (chlorahexidine gluconate) soap before surgery.  CHG is an antiseptic cleaner which kills germs and bonds with the skin to continue killing germs even after washing. Please DO NOT use if you have an allergy to CHG or antibacterial soaps.  If your skin becomes reddened/irritated stop using the CHG and inform your nurse when you arrive at Short Stay. Do not shave (including legs and underarms) for at least 48 hours prior to the first CHG shower.  You may shave your face/neck. Please follow these instructions carefully:  1.  Shower with CHG Soap the night before surgery and the  morning of Surgery.  2.  If you choose to wash your hair, wash your hair first as usual with your  normal  shampoo.  3.  After you shampoo, rinse your hair and body thoroughly to remove the  shampoo.                           4.  Use CHG as you would any other liquid soap.  You can apply chg directly  to the skin and wash                       Gently with a scrungie or clean washcloth.  5.  Apply the CHG Soap to your body ONLY FROM THE NECK DOWN.   Do not use on face/ open  Wound or open sores. Avoid contact with eyes, ears mouth and genitals (private parts).                       Wash face,  Genitals (private parts) with your normal soap.             6.  Wash thoroughly, paying special attention to the  area where your surgery  will be performed.  7.  Thoroughly rinse your body with warm water from the neck down.  8.  DO NOT shower/wash with your normal soap after using and rinsing off  the CHG Soap.                9.  Pat yourself dry with a clean towel.            10.  Wear clean pajamas.            11.  Place clean sheets on your bed the night of your first shower and do not  sleep with pets. Day of Surgery : Do not apply any lotions/deodorants the morning of surgery.  Please wear clean clothes to the hospital/surgery center.  FAILURE TO FOLLOW THESE INSTRUCTIONS MAY RESULT IN THE CANCELLATION OF YOUR SURGERY PATIENT SIGNATURE_________________________________  NURSE SIGNATURE__________________________________

## 2018-11-19 ENCOUNTER — Other Ambulatory Visit: Payer: Self-pay

## 2018-11-19 ENCOUNTER — Other Ambulatory Visit (HOSPITAL_COMMUNITY)
Admission: RE | Admit: 2018-11-19 | Discharge: 2018-11-19 | Disposition: A | Discharge status: Home / Self Care | Payer: No Typology Code available for payment source | Source: Ambulatory Visit | Attending: Urology | Admitting: Urology

## 2018-11-19 ENCOUNTER — Encounter (HOSPITAL_COMMUNITY): Payer: Self-pay

## 2018-11-19 ENCOUNTER — Encounter (HOSPITAL_COMMUNITY)
Admission: RE | Admit: 2018-11-19 | Discharge: 2018-11-19 | Disposition: A | Discharge status: Home / Self Care | Payer: No Typology Code available for payment source | Source: Ambulatory Visit | Attending: Urology | Admitting: Urology

## 2018-11-19 DIAGNOSIS — E785 Hyperlipidemia, unspecified: Secondary | ICD-10-CM | POA: Diagnosis not present

## 2018-11-19 DIAGNOSIS — N201 Calculus of ureter: Secondary | ICD-10-CM | POA: Diagnosis not present

## 2018-11-19 DIAGNOSIS — Z01812 Encounter for preprocedural laboratory examination: Secondary | ICD-10-CM | POA: Insufficient documentation

## 2018-11-19 DIAGNOSIS — I10 Essential (primary) hypertension: Secondary | ICD-10-CM | POA: Diagnosis not present

## 2018-11-19 DIAGNOSIS — E119 Type 2 diabetes mellitus without complications: Secondary | ICD-10-CM | POA: Diagnosis not present

## 2018-11-19 DIAGNOSIS — Z79899 Other long term (current) drug therapy: Secondary | ICD-10-CM | POA: Insufficient documentation

## 2018-11-19 DIAGNOSIS — Z7984 Long term (current) use of oral hypoglycemic drugs: Secondary | ICD-10-CM | POA: Insufficient documentation

## 2018-11-19 DIAGNOSIS — Z20828 Contact with and (suspected) exposure to other viral communicable diseases: Secondary | ICD-10-CM | POA: Diagnosis not present

## 2018-11-19 LAB — GLUCOSE, CAPILLARY: Glucose-Capillary: 135 mg/dL — ABNORMAL HIGH (ref 70–99)

## 2018-11-19 LAB — BASIC METABOLIC PANEL
Anion gap: 9 (ref 5–15)
BUN: 23 mg/dL — ABNORMAL HIGH (ref 6–20)
CO2: 30 mmol/L (ref 22–32)
Calcium: 9.5 mg/dL (ref 8.9–10.3)
Chloride: 98 mmol/L (ref 98–111)
Creatinine, Ser: 1.19 mg/dL (ref 0.61–1.24)
GFR calc Af Amer: >60 mL/min (ref >60)
GFR calc non Af Amer: >60 mL/min (ref >60)
Glucose, Bld: 148 mg/dL — ABNORMAL HIGH (ref 70–99)
Potassium: 5.1 mmol/L (ref 3.5–5.1)
Sodium: 137 mmol/L (ref 135–145)

## 2018-11-19 LAB — CBC
HCT: 45.4 % (ref 39.0–52.0)
Hemoglobin: 15.3 g/dL (ref 13.0–17.0)
MCH: 30.2 pg (ref 26.0–34.0)
MCHC: 33.7 g/dL (ref 30.0–36.0)
MCV: 89.7 fL (ref 80.0–100.0)
Platelets: 360 10*3/uL (ref 150–400)
RBC: 5.06 MIL/uL (ref 4.22–5.81)
RDW: 12.4 % (ref 11.5–15.5)
WBC: 10.7 10*3/uL — ABNORMAL HIGH (ref 4.0–10.5)
nRBC: 0 % (ref 0.0–0.2)

## 2018-11-19 LAB — HEMOGLOBIN A1C
Hgb A1c MFr Bld: 6.5 % — ABNORMAL HIGH (ref 4.8–5.6)
Mean Plasma Glucose: 139.85 mg/dL

## 2018-11-19 NOTE — Progress Notes (Addendum)
PCP - Dr. Olivia Mackie McLean-Scocuzza Cardiologist - none  Chest x-ray - none EKG - 01/13/18 Stress Test - none ECHO - none Cardiac Cath - none  Sleep Study - none CPAP - none  Fasting Blood Sugar - does not check glucose Checks Blood Sugar __0___ times a day  Blood Thinner Instructions:none Aspirin Instructions:none Last Dose:none  Anesthesia review:   Chart to be reviewed by Willeen Cass, PA as patient states he has a history of difficult intubation, brain tumor-cyst, DM, HTN  Patient denies shortness of breath, fever, cough and chest pain at PAT appointment   Patient verbalized understanding of instructions that were given to them at the PAT appointment. Patient was also instructed that they will need to review over the PAT instructions again at home before surgery.

## 2018-11-19 NOTE — Progress Notes (Signed)
Anesthesia Chart Review:   Case: [id number] Date/Time: 11/23/18 0715   Procedure: CYSTOSCOPY/URETEROSCOPY/HOLMIUM LASER/STENT PLACEMENT (Right )   Anesthesia type: General   Pre-op diagnosis: RIGHT RENAL AND URETERAL CALCULI   Location: [place] [facility]   Surgeon: Raynelle Bring, MD      DISCUSSION:  - Pt is a 58 year old male with hx HTN, DM  - Pt has a hx of difficult intubation. Pt has had 5 procedures under anesthesia at [facility] facilities since 2019.  A review of these records note pt has limited oral opening and anterior larynx.  Recommendations include induction with short-acting agent. 7.44mm ETT tube with stylet +/- video laryngoscope or LMA have been successfully used in the past    VS: BP (!) 143/80   Pulse 64   Temp 36.6 C (Oral)   Resp 16   Ht 5\' 10"  (1.778 m)   Wt 90.5 kg   SpO2 100%   BMI 28.64 kg/m    PROVIDERS: - PCP is McLean-Scocuzza, Nino Glow, MD   LABS: Labs reviewed: Acceptable for surgery. (all labs ordered are listed, but only abnormal results are displayed)  Labs Reviewed  GLUCOSE, CAPILLARY - Abnormal; Notable for the following components:      Result Value   Glucose-Capillary 135 (*)    All other components within normal limits  CBC - Abnormal; Notable for the following components:   WBC 10.7 (*)    All other components within normal limits  BASIC METABOLIC PANEL - Abnormal; Notable for the following components:   Glucose, Bld 148 (*)    BUN 23 (*)    All other components within normal limits  HEMOGLOBIN A1C - Abnormal; Notable for the following components:   Hgb A1c MFr Bld 6.5 (*)    All other components within normal limits     EKG 01/13/18: NSR   Past Medical History:  Diagnosis Date  . Anxiety   . Diabetes mellitus without complication ([place])   . Difficult intubation   . Erectile dysfunction   . GERD (gastroesophageal reflux disease)    occ. takes rolaids or tums if needed  . Headache    hisotry of with brain  tumor  . History of brain tumor   . History of kidney stones 2019   Bilateral  . History of umbilical hernia   . Hydronephrosis 2019   Moderate right hydronephrosis  . Hyperlipidemia   . Hypertension   . Lipoma of extremity    left inner thigh, right groin    Past Surgical History:  Procedure Laterality Date  . BRAIN SURGERY  1989   Cyst removed - benign  . COLONOSCOPY  2015  . CYSTOSCOPY W/ URETERAL STENT PLACEMENT Right 05/27/2017   Procedure: CYSTOSCOPY WITH STENT REPLACEMENT;  Surgeon: [name], MD;  Location: [facility] ORS;  Service: Urology;  Laterality: Right;  . CYSTOSCOPY WITH STENT PLACEMENT Right 01/23/2018   Procedure: CYSTOSCOPY WITH STENT PLACEMENT;  Surgeon: Billey Co, MD;  Location: [facility] ORS;  Service: Urology;  Laterality: Right;  . CYSTOSCOPY/RETROGRADE/URETEROSCOPY Right 01/23/2018   Procedure: CYSTOSCOPY/RETROGRADE/URETEROSCOPY;  Surgeon: Billey Co, MD;  Location: [facility] ORS;  Service: Urology;  Laterality: Right;  . CYSTOSCOPY/URETEROSCOPY/HOLMIUM LASER Right 05/27/2017   Procedure: CYSTOSCOPY/URETEROSCOPY/HOLMIUM LASER;  Surgeon: [name], MD;  Location: [facility] ORS;  Service: Urology;  Laterality: Right;  . CYSTOSCOPY/URETEROSCOPY/HOLMIUM LASER/STENT PLACEMENT Right 04/11/2017   Procedure: CYSTOSCOPY/URETEROSCOPY/HOLMIUM LASER/STENT PLACEMENT;  Surgeon: [name], MD;  Location: [facility] ORS;  Service: Urology;  Laterality: Right;  . CYSTOSCOPY/URETEROSCOPY/HOLMIUM LASER/STENT PLACEMENT Right 06/18/2018   Procedure: CYSTOSCOPY/ RETROGRADE//URETEROSCOPY/HOLMIUM LASER/STENT PLACEMENT;  Surgeon: Raynelle Bring, MD;  Location: [facility];  Service: Urology;  Laterality: Right;  . INGUINAL HERNIA REPAIR    . MASS EXCISION Bilateral 04/22/2017   Procedure: EXCISION OF 3  MASSES OF PERIRECTAL, INNER THIGH AREAS;  Surgeon: Johnathan Hausen, MD;  Location: [place];  Service: General;  Laterality: Bilateral;  LMA  . ROTATOR CUFF REPAIR  Right 2014   Dr. Mack Guise  . VENTRAL HERNIA REPAIR      MEDICATIONS: . amLODipine (NORVASC) 5 MG tablet  . atorvastatin (LIPITOR) 20 MG tablet  . chlorthalidone (HYGROTON) 25 MG tablet  . metFORMIN (GLUCOPHAGE) 500 MG tablet  . naproxen sodium (ALEVE) 220 MG tablet  . phenazopyridine (PYRIDIUM) 200 MG tablet  . tadalafil (ADCIRCA/CIALIS) 20 MG tablet  . telmisartan (MICARDIS) 80 MG tablet  . traMADol (ULTRAM) 50 MG tablet   No current facility-administered medications for this encounter.     If no changes, I anticipate pt can proceed with surgery as scheduled.   Willeen Cass, FNP-BC [facility] Short Stay Surgical [facility]/Anesthesiology Phone: [phone] 11/19/2018 12:56 PM

## 2018-11-20 NOTE — H&P (Signed)
1. Right renal and ureteral calculi  2. Left renal calculi  3. Elevated PSA  4. Erectile dysfunction   Andrew Stevens returns today for follow-up after his stent was removed in June following ureteroscopic management of his stricture and impacted stone as well as some of his renal calculi. Since stent removal, he has been asymptomatic. He denies any flank pain or hematuria. He denies urinary tract infection. He follows up today after undergoing a repeat CT scan and Lasix renal scan. He did have an elevated PSA although this decreased to 1.34 when repeated. We did discuss a trial of sildenafil at his last visit although this apparently was never called into his pharmacy and he did not pursue this further.     ALLERGIES: None   MEDICATIONS: Lipitor  Metformin Hcl  Amlodipine Besylate  Chlorthalidone  Micardis     GU PSH: Cysto Remove Stent FB Sim - 07/08/2018 Cysto Uretero Lithotripsy Cysto/uretero W/up Stricture, Right - 06/18/2018 Cystoscopy Ureteroscopy Ureteroscopic laser litho, Right - 06/18/2018 Ureteroscopic stone removal, Right - 06/18/2018       PSH Notes: Colloid cyst removal from head 1989   NON-GU PSH: Hernia Repair     GU PMH: ED due to arterial insufficiency - 03/31/2018 Elevated PSA - 03/31/2018 Renal and ureteral calculus - 03/31/2018 Ureteral stricture - 03/31/2018      PMH Notes:   1) Urolithiasis: He has a history of recurrent stone disease. He was noted to have a large impacted 1.9 cm proximal right ureteral stone in the spring of 2019 and underwent ureteroscopic laser lithotripsy. Follow up imaging in November 2019 indicated a 3 mm proximal right ureteral stone at the UPJ and non-obstructing stones. Attempt at ureteroscopic treatment revealed a ureteral stricture and a stent was placed. He was referred for further evaluation due to his stricture and stones in March 2020.   1989: ESWL  Spring 2019: R ureteroscopic laser lithotripsy 1.9 cm right proximal ureteral  stone Andrew Stevens)  Dec 2019: Right ureteroscopy with stricture noted at UPJ (stent placed)  May 2020: R ureteroscopy with laser lithotripsy of right renal calculi and impacted right UPJ stone with stent placement (mild stricture noted prior to treatment of impacted stone)  Jun 2020: Stent removal   2) Elevated PSA: His PSA was elevated at 5.7 in October 2019. His PSA was repeated in March 2020 and decreased to 1.34.   3) Erectile dysfunction: He does not take nitrates.   Current treatment: Tadalafil 20 mg   NON-GU PMH: Hypertension    FAMILY HISTORY: None    Notes: 2 daughters    SOCIAL HISTORY: Marital Status: Married Preferred Language: English; Ethnicity: Not Hispanic Or Latino; Race: Other Race Current Smoking Status: Patient has never smoked.   Tobacco Use Assessment Completed: Used Tobacco in last 30 days? Drinks 2 drinks per day.  Drinks 3 caffeinated drinks per day.    REVIEW OF SYSTEMS:    GU Review Male:   Patient denies frequent urination, hard to postpone urination, burning/ pain with urination, get up at night to urinate, leakage of urine, stream starts and stops, trouble starting your streams, and have to strain to urinate .  Gastrointestinal (Upper):   Patient denies nausea and vomiting.  Gastrointestinal (Lower):   Patient denies diarrhea and constipation.  Constitutional:   Patient denies fever, night sweats, weight loss, and fatigue.  Skin:   Patient denies skin rash/ lesion and itching.  Eyes:   Patient denies blurred vision and double vision.  Ears/  Nose/ Throat:   Patient denies sore throat and sinus problems.  Hematologic/Lymphatic:   Patient denies swollen glands and easy bruising.  Cardiovascular:   Patient denies leg swelling and chest pains.  Respiratory:   Patient denies cough and shortness of breath.  Endocrine:   Patient denies excessive thirst.  Musculoskeletal:   Patient denies back pain and joint pain.  Neurological:   Patient denies headaches and  dizziness.  Psychologic:   Patient denies depression and anxiety.   VITAL SIGNS:     Weight 205 lb / 92.99 kg  Height 70 in / 177.8 cm  BMI 29.4 kg/m   MULTI-SYSTEM PHYSICAL EXAMINATION:    Constitutional: Well-nourished. No physical deformities. Normally developed. Good grooming.  Respiratory: No labored breathing, no use of accessory muscles. Clear bilaterally.  Cardiovascular: Normal temperature, normal extremity pulses, no swelling, no varicosities. Regular rate and rhythm.  Gastrointestinal: No mass, no tenderness, no rigidity, non obese abdomen. No CVA tenderness.     PAST DATA REVIEWED:  Source Of History:  Patient  Urine Test Review:   Urinalysis  X-Ray Review: C.T. Abdomen/Pelvis: Reviewed Films.  Lasix Renogram: Reviewed Films.     03/31/18  PSA  Total PSA 1.34 ng/mL   Notes:                     CT UROGRAM 10/02/2018   --------------------------------------------------------------------------------   Andrew Stevens  MRN: [id number] Ordering Provider: Raynelle Bring, JR  DOB: 03-18-1960 Date Collected: 10/02/2018 14:00:00  SSN: -**- Date Completed: 10/02/2018 10:15:37   --------------------------------------------------------------------------------   CLINICAL DATA: Renal calculi. Prior right proximal ureteral  stricture which was treated.   EXAM:  CT ABDOMEN AND PELVIS WITHOUT CONTRAST   TECHNIQUE:  Multidetector CT imaging of the abdomen and pelvis was performed  following the standard protocol without IV contrast.   COMPARISON: 12/12/2017   FINDINGS:  Lower chest: Unremarkable   Hepatobiliary: Unremarkable   Pancreas: Unremarkable   Spleen: Unremarkable   Adrenals/Urinary Tract: There is a cluster of 2-3 right renal  calculi in the proximal ureter with the cluster measuring about 0.6  by 0.3 by 0.9 cm. Mild right hydronephrosis to the level of this  cluster, without distal hydroureter. No other ureteral calculi are  identified.   There are about  5 right renal calculi mostly in the lower pole, the  largest measuring 1.2 cm on image 63/601. There is a notable degree  of right renal scarring and a suspected small right kidney lower  pole cyst.   There are about 7 nonobstructive left renal calculi in the lower  pole and mid kidney, with the largest measuring 1.5 cm in long axis  on image 65/601.   The adrenal glands appear normal.   Stomach/Bowel: Prominent stool throughout the colon favors  constipation.   Vascular/Lymphatic: Aortoiliac atherosclerotic vascular disease.   Reproductive: Unremarkable   Other: Subcutaneous nodule along the lower right paracentral  anterior abdominal wall measuring 1.9 by 1.2 cm on image 68/2,  formerly 1.6 by 0.9 cm on 12/12/2017. Probably a sebaceous cyst or  similar benign lesion.   Musculoskeletal: Bridging spurring of both sacroiliac joints. Grade  1 degenerative anterolisthesis at L4-5. Congenitally short pedicles  in the lumbar spine. Conjunction with spondylosis and degenerative  disc disease there is resulting impingement at L3-4, L4-5, and  L5-S1. The broad transverse processes of L5 pseudoarticulates with  the sacrum with some sclerosis specially on the left side.   IMPRESSION:  1. There is a cluster  of 2-3 right renal calculi in the right  proximal ureter, cluster measures 0.6 by 0.3 by 0.9 cm, contributing  to mild right hydronephrosis.  2. Additional bilateral nonobstructive renal calculi are observed.  3. Aortic Atherosclerosis (ICD10-I70.0).  4. Prominent stool throughout the colon favors constipation.  5. Multilevel lower lumbar impingement.  6. The broad transverse processes of L5 pseudoarticulate with the  sacrum.    Electronically Signed  By: Van Clines M.D.  On: 10/02/2018 10:13   CLINICAL DATA: Ureteral stricture. Bilateral renal ureteral  calculi.   EXAM:  NUCLEAR MEDICINE RENAL SCAN WITH DIURETIC ADMINISTRATION   TECHNIQUE:  Radionuclide  angiographic and sequential renal images were obtained  after intravenous injection of radiopharmaceutical. Imaging was  continued during slow intravenous injection of Lasix approximately  15 minutes after the start of the examination.   RADIOPHARMACEUTICALS: 5.2 mCi Technetium-64m MAG3 IV   COMPARISON: CT 10/02/2018   FINDINGS:  Flow: Decreased arterial flow to the RIGHT kidney compared to the  LEFT.Marland Kitchen   Left renogram: Uniform uptake of counts in the renal cortex. Counts  are promptly excreted into the collecting system and cleared prior  to administration of Lasix. Lasix augment clearance. No postvoid  residual.   Right renogram: RIGHT kidney is small compared to the LEFT. There is  delayed renal cortical uptake. Counts do collect in the RIGHT renal  collecting system and the RIGHT ureter is faintly seen. Minimal  postvoid residual.   Differential:   Left kidney = 79 %   Right kidney = 21 %   T1/2 post Lasix :   Left kidney = (counts clear near completely prior to administration  of Lasix)   Right kidney = estimated at 27 min   IMPRESSION:  1. Asymmetric renal function and size with normal LEFT renal  function and increased in size compared to the RIGHT.  2. Decreased vascular flow to the RIGHT kidney compared to the LEFT.  Decreased cortical uptake. Decreased excretion.  3. Mild ureteral obstruction of the RIGHT kidney (indicated by  delayed T 1/2 post Lasix). No evidence of hydronephrosis.    Electronically Signed  By: Suzy Bouchard M.D.  On: 10/02/2018 14:51      ASSESSMENT:      ICD-10 Details  1 GU:   Renal and ureteral calculus - N20.2   2   ED due to arterial insufficiency - N52.01   3   Ureteral stricture - N13.5    PLAN:        1. Right renal and ureteral calculi: We reviewed his CT scan and renal scan. His renal scan does suggest decreased right relative renal function and evidence of ongoing obstruction. It is difficult to know how acute his  right renal dysfunction has been as this may have occurred over a longer period of time. Regardless, I do think we should re-evaluate his right ureter considering his CT which does suggest persistent calcifications in the vicinity of the proximal right ureter. These may potentially even be outside the lumen of the ureter considering his prior findings on ureteroscopy. I therefore recommended that he undergo cystoscopy, right retrograde pyelography, right ureteroscopy with possible laser lithotripsy of his ureteral and renal calculi. He likely will need a postoperative ureteral stent and understands and is agreeable to this. If no significant concern is identified ureteroscopically, we will continue with observation follow renogram to ensure he does not develop progressive relative renal function although he understands that he does have fairly poor relative renal function at  this time.   2. Left renal calculi: I have recommended that we treat his left renal calculi at some point. We have briefly discussed options including percutaneous nephrolithotomy vs. ureteroscopic treatment. He seems averse to percutaneous treatment and we will discuss this in the future after we have fully addressed his right side.   3. Recurrent urolithiasis: He is on chlorthalidone for hypertension. I did recommend that he proceed with a full metabolic evaluation at this time including serum and 24 hour urine testing. We will then work to modify his risk factors for future stone formation.   4. Erectile dysfunction: A prescription for sildenafil was called in for him today.   5. History of elevated PSA: We will consider checking this in the spring of 2021.

## 2018-11-22 LAB — NOVEL CORONAVIRUS, NAA (HOSP ORDER, SEND-OUT TO REF LAB; TAT 18-24 HRS): SARS-CoV-2, NAA: NOT DETECTED

## 2018-11-23 ENCOUNTER — Encounter (HOSPITAL_COMMUNITY)
Admission: RE | Disposition: A | Discharge status: Home / Self Care | Payer: Self-pay | Source: Home / Self Care | Attending: Urology

## 2018-11-23 ENCOUNTER — Other Ambulatory Visit: Payer: Self-pay

## 2018-11-23 ENCOUNTER — Encounter (HOSPITAL_COMMUNITY): Payer: Self-pay | Admitting: *Deleted

## 2018-11-23 ENCOUNTER — Ambulatory Visit (HOSPITAL_COMMUNITY)
Admission: RE | Admit: 2018-11-23 | Discharge: 2018-11-23 | Disposition: A | Discharge status: Home / Self Care | Payer: No Typology Code available for payment source | Attending: Urology | Admitting: Urology

## 2018-11-23 ENCOUNTER — Ambulatory Visit (HOSPITAL_COMMUNITY): Payer: No Typology Code available for payment source | Admitting: Emergency Medicine

## 2018-11-23 ENCOUNTER — Ambulatory Visit (HOSPITAL_COMMUNITY): Payer: No Typology Code available for payment source | Admitting: Anesthesiology

## 2018-11-23 ENCOUNTER — Ambulatory Visit (HOSPITAL_COMMUNITY): Payer: No Typology Code available for payment source

## 2018-11-23 DIAGNOSIS — Z79899 Other long term (current) drug therapy: Secondary | ICD-10-CM | POA: Diagnosis not present

## 2018-11-23 DIAGNOSIS — K219 Gastro-esophageal reflux disease without esophagitis: Secondary | ICD-10-CM | POA: Insufficient documentation

## 2018-11-23 DIAGNOSIS — N202 Calculus of kidney with calculus of ureter: Secondary | ICD-10-CM | POA: Diagnosis not present

## 2018-11-23 DIAGNOSIS — N5201 Erectile dysfunction due to arterial insufficiency: Secondary | ICD-10-CM | POA: Insufficient documentation

## 2018-11-23 DIAGNOSIS — I10 Essential (primary) hypertension: Secondary | ICD-10-CM | POA: Insufficient documentation

## 2018-11-23 DIAGNOSIS — Z7984 Long term (current) use of oral hypoglycemic drugs: Secondary | ICD-10-CM | POA: Diagnosis not present

## 2018-11-23 DIAGNOSIS — E119 Type 2 diabetes mellitus without complications: Secondary | ICD-10-CM | POA: Insufficient documentation

## 2018-11-23 DIAGNOSIS — N135 Crossing vessel and stricture of ureter without hydronephrosis: Secondary | ICD-10-CM | POA: Diagnosis not present

## 2018-11-23 HISTORY — PX: CYSTOSCOPY/URETEROSCOPY/HOLMIUM LASER/STENT PLACEMENT: SHX6546

## 2018-11-23 LAB — GLUCOSE, CAPILLARY
Glucose-Capillary: 146 mg/dL — ABNORMAL HIGH (ref 70–99)
Glucose-Capillary: 159 mg/dL — ABNORMAL HIGH (ref 70–99)

## 2018-11-23 SURGERY — CYSTOSCOPY/URETEROSCOPY/HOLMIUM LASER/STENT PLACEMENT
Anesthesia: General | Laterality: Right

## 2018-11-23 MED ORDER — IOHEXOL 300 MG/ML  SOLN
INTRAMUSCULAR | Status: DC | PRN
  Administered 2018-11-23: 9 mL

## 2018-11-23 MED ORDER — PROPOFOL 10 MG/ML IV BOLUS
INTRAVENOUS | Status: DC | PRN
  Administered 2018-11-23: 100 mg via INTRAVENOUS
  Administered 2018-11-23: 200 mg via INTRAVENOUS

## 2018-11-23 MED ORDER — LACTATED RINGERS IV SOLN
INTRAVENOUS | Status: DC
  Administered 2018-11-23 (×2): via INTRAVENOUS

## 2018-11-23 MED ORDER — PHENYLEPHRINE 40 MCG/ML (10ML) SYRINGE FOR IV PUSH (FOR BLOOD PRESSURE SUPPORT)
PREFILLED_SYRINGE | INTRAVENOUS | Status: DC | PRN
  Administered 2018-11-23: 80 ug via INTRAVENOUS

## 2018-11-23 MED ORDER — LIDOCAINE 2% (20 MG/ML) 5 ML SYRINGE
INTRAMUSCULAR | Status: DC | PRN
  Administered 2018-11-23: 60 mg via INTRAVENOUS

## 2018-11-23 MED ORDER — PROPOFOL 10 MG/ML IV BOLUS
INTRAVENOUS | Status: AC
  Filled 2018-11-23: qty 20

## 2018-11-23 MED ORDER — POTASSIUM CITRATE ER 10 MEQ (1080 MG) PO TBCR: 20.0000 meq | EXTENDED_RELEASE_TABLET | Freq: Two times a day (BID) | ORAL | 11 refills | Status: DC

## 2018-11-23 MED ORDER — MIDAZOLAM HCL 5 MG/5ML IJ SOLN
INTRAMUSCULAR | Status: DC | PRN
  Administered 2018-11-23: 2 mg via INTRAVENOUS

## 2018-11-23 MED ORDER — FENTANYL CITRATE (PF) 100 MCG/2ML IJ SOLN
INTRAMUSCULAR | Status: DC | PRN
  Administered 2018-11-23: 50 ug via INTRAVENOUS

## 2018-11-23 MED ORDER — EPHEDRINE SULFATE-NACL 50-0.9 MG/10ML-% IV SOSY
PREFILLED_SYRINGE | INTRAVENOUS | Status: DC | PRN
  Administered 2018-11-23 (×2): 10 mg via INTRAVENOUS

## 2018-11-23 MED ORDER — CEFAZOLIN SODIUM-DEXTROSE 2-4 GM/100ML-% IV SOLN
INTRAVENOUS | Status: AC
  Filled 2018-11-23: qty 100

## 2018-11-23 MED ORDER — CEFAZOLIN SODIUM-DEXTROSE 2-4 GM/100ML-% IV SOLN
2.0000 g | Freq: Once | INTRAVENOUS | Status: AC
  Administered 2018-11-23: 2 g via INTRAVENOUS

## 2018-11-23 MED ORDER — MIDAZOLAM HCL 2 MG/2ML IJ SOLN
INTRAMUSCULAR | Status: AC
  Filled 2018-11-23: qty 2

## 2018-11-23 MED ORDER — SODIUM CHLORIDE 0.9 % IR SOLN
Status: DC | PRN
  Administered 2018-11-23: 3000 mL

## 2018-11-23 MED ORDER — 0.9 % SODIUM CHLORIDE (POUR BTL) OPTIME
TOPICAL | Status: DC | PRN
  Administered 2018-11-23: 1000 mL

## 2018-11-23 MED ORDER — PHENAZOPYRIDINE HCL 200 MG PO TABS: 200.0000 mg | ORAL_TABLET | Freq: Three times a day (TID) | ORAL | 0 refills | Status: DC | PRN

## 2018-11-23 MED ORDER — FENTANYL CITRATE (PF) 100 MCG/2ML IJ SOLN
INTRAMUSCULAR | Status: AC
  Filled 2018-11-23: qty 2

## 2018-11-23 MED ORDER — ONDANSETRON HCL 4 MG/2ML IJ SOLN
INTRAMUSCULAR | Status: DC | PRN
  Administered 2018-11-23: 4 mg via INTRAVENOUS

## 2018-11-23 SURGICAL SUPPLY — 21 items
BAG URO CATCHER STRL LF (MISCELLANEOUS) ×2 IMPLANT
BASKET ZERO TIP NITINOL 2.4FR (BASKET) ×2 IMPLANT
CATH INTERMIT  6FR 70CM (CATHETERS) ×2 IMPLANT
CLOTH BEACON ORANGE TIMEOUT ST (SAFETY) ×2 IMPLANT
FIBER LASER FLEXIVA 365 (UROLOGICAL SUPPLIES) IMPLANT
FIBER LASER TRAC TIP (UROLOGICAL SUPPLIES) ×2 IMPLANT
GLOVE BIOGEL M STRL SZ7.5 (GLOVE) ×2 IMPLANT
GOWN STRL REUS W/TWL LRG LVL3 (GOWN DISPOSABLE) ×4 IMPLANT
GUIDEWIRE ANG ZIPWIRE 038X150 (WIRE) IMPLANT
GUIDEWIRE STR DUAL SENSOR (WIRE) ×2 IMPLANT
IV NS 1000ML (IV SOLUTION) ×1
IV NS 1000ML BAXH (IV SOLUTION) ×1 IMPLANT
KIT BALLN UROMAX 15FX4 (MISCELLANEOUS) ×1 IMPLANT
KIT BALLN UROMAX 26 75X4 (MISCELLANEOUS) ×1
KIT TURNOVER KIT A (KITS) IMPLANT
MANIFOLD NEPTUNE II (INSTRUMENTS) ×2 IMPLANT
PACK CYSTO (CUSTOM PROCEDURE TRAY) ×2 IMPLANT
SHEATH URETERAL 12FRX35CM (MISCELLANEOUS) ×2 IMPLANT
STENT CONTOUR NO GW 8FR 26CM (STENTS) ×2 IMPLANT
TUBING CONNECTING 10 (TUBING) ×2 IMPLANT
TUBING UROLOGY SET (TUBING) ×2 IMPLANT

## 2018-11-23 NOTE — Anesthesia Preprocedure Evaluation (Signed)
Anesthesia Evaluation  Patient identified by MRN, date of birth, ID band Patient awake    Reviewed: Allergy & Precautions, NPO status , Patient's Chart, lab work & pertinent test results  Airway Mallampati: I  TM Distance: >3 FB Neck ROM: Full    Dental   Pulmonary    Pulmonary exam normal        Cardiovascular hypertension, Pt. on medications Normal cardiovascular exam     Neuro/Psych Anxiety    GI/Hepatic GERD  Medicated and Controlled,  Endo/Other  diabetes  Renal/GU      Musculoskeletal   Abdominal   Peds  Hematology   Anesthesia Other Findings   Reproductive/Obstetrics                             Anesthesia Physical Anesthesia Plan  ASA: II  Anesthesia Plan: General   Post-op Pain Management:    Induction: Intravenous  PONV Risk Score and Plan: 2 and Ondansetron and Treatment may vary due to age or medical condition  Airway Management Planned: LMA  Additional Equipment:   Intra-op Plan:   Post-operative Plan: Extubation in OR  Informed Consent: I have reviewed the patients History and Physical, chart, labs and discussed the procedure including the risks, benefits and alternatives for the proposed anesthesia with the patient or authorized representative who has indicated his/her understanding and acceptance.       Plan Discussed with: CRNA and Surgeon  Anesthesia Plan Comments:         Anesthesia Quick Evaluation

## 2018-11-23 NOTE — Transfer of Care (Signed)
Immediate Anesthesia Transfer of Care Note  Patient: Andrew Stevens  Procedure(s) Performed: CYSTOSCOPY/RETROGRADE PYELOGRAM/URETEROSCOPY/HOLMIUM LASER/STENT PLACEMENT (Right )  Patient Location: PACU  Anesthesia Type:General  Level of Consciousness: awake, alert  and oriented  Airway & Oxygen Therapy: Patient Spontanous Breathing and Patient connected to face mask oxygen  Post-op Assessment: Report given to RN and Post -op Vital signs reviewed and stable  Post vital signs: Reviewed and stable  Last Vitals:  Vitals Value Taken Time  BP 124/79 11/23/18 0903  Temp    Pulse 82 11/23/18 0905  Resp 16 11/23/18 0905  SpO2 100 % 11/23/18 0905  Vitals shown include unvalidated device data.  Last Pain:  Vitals:   11/23/18 0548  TempSrc: Oral         Complications: No apparent anesthesia complications

## 2018-11-23 NOTE — Op Note (Signed)
Preoperative diagnosis: 1.  Right renal calculi 2.  History of impacted proximal right ureteral calculus  Postoperative diagnoses: 1.  Right renal calculi 2.  History of impacted proximal right ureteral calculus  Procedures: 1.  Cystoscopy 2.  Right retrograde pyelography with interpretation 3.  Right ureteroscopy with laser lithotripsy and stone removal 4.  Balloon dilation of proximal right ureteral stricture 5.  Right ureteral stent placement (8 x 26 -no string)  Surgeon: Pryor Curia. MD  Anesthesia: General  Complications: None  Intraoperative findings: Right retrograde pyelography was performed with a 6 French ureteral catheter and Omnipaque contrast.  This demonstrated a pinpoint proximal right ureteral stricture less than 1 cm.  There were noted to be multiple filling defects with a stippled appearance in the lower pole calyx consistent with the patient's known remaining stones.  Specimen: Right renal calculi  Disposition of specimen: [facility]  Indication: Andrew Stevens is a 58 year old gentleman with a history of an impacted right ureteral calculus.  A few months ago, he underwent endoscopic treatment then included laser lithotripsy and stone removal as well as treatment of large burden right renal calculi.  He had an indwelling ureteral stent for an extended period of time that was subsequently removed.  On follow-up imaging, he was noted to have findings concerning for ureteral obstruction on the right side.  In addition, he was noted to have decreased relative renal function of approximately 21% on the right side.  CT imaging demonstrated calcifications in the vicinity of the proximal ureter suggesting possible extra ureteral calcifications.  He also had remaining right renal calculi.  After reviewing options, he elected to proceed with further endoscopic management and the above procedures.  The potential risks, complications, and expected recovery  process was discussed in detail.  Informed consent was obtained.  Description of procedure: The patient was taken to the operating room and a general anesthetic was administered.  He was given preoperative antibiotics, placed in the dorsolithotomy position, and prepped and draped in the usual sterile fashion.  Next, a preoperative timeout was performed.  Cystourethroscopy was then performed revealing a normal anterior and posterior urethra.  Inspection of the bladder was unremarkable.  No tumors, stones, or other mucosal pathology was identified.  Attention then turned to the right ureteral orifice.  This was intubated with a 6 French ureteral catheter and Omnipaque contrast was injected.  Findings were as dictated as above.  A 0.38 sensor guidewire was then advanced up into the right renal pelvis under fluoroscopic guidance.  Using the prior fluoroscopic imaging, a 12/14 ureteral access sheath was advanced to a point just below the strictured area.  The flexible ureteroscope was then advanced up to the strictured area which was identified.  This area would not allow the flexible ureteroscope to pass.  The wire was kept in place and the scope was removed.  A 15 French, 4 cm balloon was then advanced over the wire and across the stricture.  This was then inflated to 18 mmHg pressure.  This was left inflated for approximately 5 minutes.  Reinspection allowed the digital flexible scope to be passed into the renal collecting system which was then examined in its entirety.  There were multiple stones located in the lower pole calyx without other stones noted.  A few larger stones were manipulated with a 0 tip nitinol basket into an upper pole calyx and they were fragmented with a 200 m holmium laser fiber on a setting of 0.8 J  and 6 Hz.  Using the 0 tip nitinol basket, all stone fragments were then removed except for any extremely small fragments that were 2 mm or less in size.  Attention then turned to the prior  strictured area in the proximal ureter.  The laser fiber was used to perform an incision laterally to determine if any further stone material was identified.  There was a small amount of stone that still appeared to be impacted in this area and this was fragmented.  The wire was left in place and the ureteroscope and access sheath were removed.  An 8 x 26 double-J ureteral stent was then advanced over the wire using Seldinger technique and positioned appropriately under fluoroscopic and cystoscopic guidance.  The wire was removed with a good curl noted in the renal pelvis as well as in the bladder.  The patient tolerated the procedure well without complications.  He was able to be extubated and transferred to the recovery unit in satisfactory condition.

## 2018-11-23 NOTE — Anesthesia Postprocedure Evaluation (Signed)
Anesthesia Post Note  Patient: Andrew Stevens  Procedure(s) Performed: CYSTOSCOPY/RETROGRADE PYELOGRAM/URETEROSCOPY/HOLMIUM LASER/STENT PLACEMENT (Right )     Patient location during evaluation: PACU Anesthesia Type: General Level of consciousness: awake and alert Pain management: pain level controlled Vital Signs Assessment: post-procedure vital signs reviewed and stable Respiratory status: spontaneous breathing, nonlabored ventilation, respiratory function stable and patient connected to nasal cannula oxygen Cardiovascular status: blood pressure returned to baseline and stable Postop Assessment: no apparent nausea or vomiting Anesthetic complications: no    Last Vitals:  Vitals:   11/23/18 0930 11/23/18 0945  BP: 130/65 126/90  Pulse: 71 79  Resp: 18 20  Temp: (!) 36.4 C (!) 36.3 C  SpO2: 100% 98%    Last Pain:  Vitals:   11/23/18 0945  TempSrc:   PainSc: 0-No pain                 Elijahjames Fuelling DAVID

## 2018-11-23 NOTE — Discharge Instructions (Signed)

## 2018-11-23 NOTE — Anesthesia Procedure Notes (Signed)
Procedure Name: LMA Insertion Performed by: Devyon Keator J, CRNA Pre-anesthesia Checklist: Patient identified, Emergency Drugs available, Suction available, Patient being monitored and Timeout performed Patient Re-evaluated:Patient Re-evaluated prior to induction Oxygen Delivery Method: Circle system utilized Preoxygenation: Pre-oxygenation with 100% oxygen Induction Type: IV induction Ventilation: Mask ventilation without difficulty LMA: LMA inserted LMA Size: 4.0 Number of attempts: 2 Placement Confirmation: positive ETCO2 and breath sounds checked- equal and bilateral Tube secured with: Tape Dental Injury: Teeth and Oropharynx as per pre-operative assessment        

## 2018-11-24 ENCOUNTER — Encounter (HOSPITAL_COMMUNITY): Payer: Self-pay | Admitting: Urology

## 2018-12-09 ENCOUNTER — Other Ambulatory Visit: Payer: Self-pay | Admitting: Internal Medicine

## 2018-12-09 DIAGNOSIS — I10 Essential (primary) hypertension: Secondary | ICD-10-CM

## 2018-12-09 MED ORDER — CHLORTHALIDONE 25 MG PO TABS: 25.0000 mg | ORAL_TABLET | Freq: Every day | ORAL | 3 refills | Status: DC

## 2018-12-09 MED ORDER — TELMISARTAN 80 MG PO TABS: 80.0000 mg | ORAL_TABLET | Freq: Every day | ORAL | 3 refills | Status: DC

## 2018-12-09 MED ORDER — AMLODIPINE BESYLATE 5 MG PO TABS: 5.0000 mg | ORAL_TABLET | ORAL | 3 refills | Status: DC

## 2019-01-08 ENCOUNTER — Encounter: Payer: Self-pay | Admitting: Internal Medicine

## 2019-01-08 ENCOUNTER — Ambulatory Visit (INDEPENDENT_AMBULATORY_CARE_PROVIDER_SITE_OTHER): Payer: No Typology Code available for payment source | Admitting: Internal Medicine

## 2019-01-08 VITALS — Ht 70.0 in | Wt 199.0 lb

## 2019-01-08 DIAGNOSIS — E119 Type 2 diabetes mellitus without complications: Secondary | ICD-10-CM | POA: Diagnosis not present

## 2019-01-08 DIAGNOSIS — Z1329 Encounter for screening for other suspected endocrine disorder: Secondary | ICD-10-CM

## 2019-01-08 DIAGNOSIS — I10 Essential (primary) hypertension: Secondary | ICD-10-CM | POA: Diagnosis not present

## 2019-01-08 DIAGNOSIS — Z0000 Encounter for general adult medical examination without abnormal findings: Secondary | ICD-10-CM

## 2019-01-08 DIAGNOSIS — N200 Calculus of kidney: Secondary | ICD-10-CM

## 2019-01-08 MED ORDER — METFORMIN HCL 500 MG PO TABS: 500.0000 mg | ORAL_TABLET | Freq: Two times a day (BID) | ORAL | 3 refills | Status: DC

## 2019-01-08 MED ORDER — AMLODIPINE BESYLATE 10 MG PO TABS: 10.0000 mg | ORAL_TABLET | ORAL | 3 refills | Status: DC

## 2019-01-08 NOTE — Patient Instructions (Signed)

## 2019-01-08 NOTE — Progress Notes (Addendum)
Virtual Visit via Video Note  I connected with Andrew Stevens  on 01/08/19 at  8:30 AM EST by a video enabled telemedicine application and verified that I am speaking with the correct person using two identifiers.  Location patient: home Location provider:work or home office Persons participating in the virtual visit: patient, provider, pts wife   I discussed the limitations of evaluation and management by telemedicine and the availability of in person appointments. The patient expressed understanding and agreed to proceed.   HPI: Annual  1. HTN 135/85 on norvasc 5, micardis 80 and chlorthalidone 25 mg qd  2. Kidney stones f/u Dr. Alinda Money 03/2019 so far doing well on K citrate, increased water and lemon and crytsal light 3. DM 2 A1C 6.5 10/2018 on metformin 500 mg bid but eating cakes at tiems his wife makes    ROS: See pertinent positives and negatives per HPI. General: wt stable  HEENT: nl hearting  CV: no chest pain  Lungs: no sob  GI: no ab pain/blood in stools  GU: urinating ok, no GU pain  MSK: no jt pain  Neuro: no h/a  Psych: no mood d/o  Skin: cyst resolved in private area   Past Medical History:  Diagnosis Date  . Anxiety   . Diabetes mellitus without complication ([place])   . Difficult intubation   . Erectile dysfunction   . GERD (gastroesophageal reflux disease)    occ. takes rolaids or tums if needed  . Headache    hisotry of with brain tumor  . History of brain tumor   . History of kidney stones 2019   Bilateral  . History of umbilical hernia   . Hydronephrosis 2019   Moderate right hydronephrosis  . Hyperlipidemia   . Hypertension   . Lipoma of extremity    left inner thigh, right groin    Past Surgical History:  Procedure Laterality Date  . BRAIN SURGERY  1989   Cyst removed - benign  . COLONOSCOPY  2015  . CYSTOSCOPY W/ URETERAL STENT PLACEMENT Right 05/27/2017   Procedure: CYSTOSCOPY WITH STENT REPLACEMENT;  Surgeon: [name], MD;   Location: [facility];  Service: Urology;  Laterality: Right;  . CYSTOSCOPY WITH STENT PLACEMENT Right 01/23/2018   Procedure: CYSTOSCOPY WITH STENT PLACEMENT;  Surgeon: Billey Co, MD;  Location: [facility];  Service: Urology;  Laterality: Right;  . CYSTOSCOPY/RETROGRADE/URETEROSCOPY Right 01/23/2018   Procedure: CYSTOSCOPY/RETROGRADE/URETEROSCOPY;  Surgeon: Billey Co, MD;  Location: [facility];  Service: Urology;  Laterality: Right;  . CYSTOSCOPY/URETEROSCOPY/HOLMIUM LASER Right 05/27/2017   Procedure: CYSTOSCOPY/URETEROSCOPY/HOLMIUM LASER;  Surgeon: [name], MD;  Location: [facility];  Service: Urology;  Laterality: Right;  . CYSTOSCOPY/URETEROSCOPY/HOLMIUM LASER/STENT PLACEMENT Right 04/11/2017   Procedure: CYSTOSCOPY/URETEROSCOPY/HOLMIUM LASER/STENT PLACEMENT;  Surgeon: [name], MD;  Location: [facility];  Service: Urology;  Laterality: Right;  . CYSTOSCOPY/URETEROSCOPY/HOLMIUM LASER/STENT PLACEMENT Right 06/18/2018   Procedure: CYSTOSCOPY/ RETROGRADE//URETEROSCOPY/HOLMIUM LASER/STENT PLACEMENT;  Surgeon: Raynelle Bring, MD;  Location: [facility];  Service: Urology;  Laterality: Right;  . CYSTOSCOPY/URETEROSCOPY/HOLMIUM LASER/STENT PLACEMENT Right 11/23/2018   Procedure: CYSTOSCOPY/RETROGRADE PYELOGRAM/URETEROSCOPY/HOLMIUM LASER/STENT PLACEMENT;  Surgeon: Raynelle Bring, MD;  Location: [facility];  Service: Urology;  Laterality: Right;  . INGUINAL HERNIA REPAIR    . MASS EXCISION Bilateral 04/22/2017   Procedure: EXCISION OF 3  MASSES OF PERIRECTAL, INNER THIGH AREAS;  Surgeon: Johnathan Hausen, MD;  Location: [facility];  Service: General;  Laterality: Bilateral;  LMA  . ROTATOR CUFF REPAIR Right 2014  Dr. Mack Guise  . VENTRAL HERNIA REPAIR      Family History  Problem Relation Age of Onset  . Hypertension Mother   . Rheum arthritis Mother   . Alcohol abuse Brother   . Heart attack Sister   . Early death Sister        During childbirth  . Thyroid disease  Sister   . Colon cancer Neg Hx   . Prostate cancer Neg Hx     SOCIAL HX:  Lives in [place] with wife. Dog. [place].  3 grandchildren, 2 children, married to wife x 47 years as of 01/08/2019 From [facility]  Regular Exercise -  NO Daily Caffeine Use:  4 cups coffee in AM, 1 cup tea in AM  Current Outpatient Medications:  .  amLODipine (NORVASC) 10 MG tablet, Take 1 tablet (10 mg total) by mouth every morning., Disp: 90 tablet, Rfl: 3 .  atorvastatin (LIPITOR) 20 MG tablet, TAKE 1 TABLET BY MOUTH DAILY AT NIGHT (Patient taking differently: Take 20 mg by mouth at bedtime. ), Disp: 90 tablet, Rfl: 3 .  chlorthalidone (HYGROTON) 25 MG tablet, Take 1 tablet (25 mg total) by mouth daily., Disp: 90 tablet, Rfl: 3 .  metFORMIN (GLUCOPHAGE) 500 MG tablet, Take 1 tablet (500 mg total) by mouth 2 (two) times daily with a meal., Disp: 180 tablet, Rfl: 3 .  naproxen sodium (ALEVE) 220 MG tablet, Take 220-440 mg by mouth daily as needed (for pain). , Disp: , Rfl:  .  potassium citrate (UROCIT-K) 10 MEQ (1080 MG) SR tablet, Take 2 tablets (20 mEq total) by mouth 2 (two) times daily., Disp: 120 tablet, Rfl: 11 .  telmisartan (MICARDIS) 80 MG tablet, Take 1 tablet (80 mg total) by mouth daily., Disp: 90 tablet, Rfl: 3  EXAM:  VITALS per patient if applicable:  GENERAL: alert, oriented, appears well and in no acute distress  HEENT: atraumatic, conjunttiva clear, no obvious abnormalities on inspection of external nose and ears  NECK: normal movements of the head and neck  LUNGS: on inspection no signs of respiratory distress, breathing rate appears normal, no obvious gross SOB, gasping or wheezing  CV: no obvious cyanosis  MS: moves all visible extremities without noticeable abnormality  PSYCH/NEURO: pleasant and cooperative, no obvious depression or anxiety, speech and thought processing grossly intact  ASSESSMENT AND PLAN:  Discussed the following assessment and plan:  Annual  physical exam Had flu shot utd Tdap utd  pna 23 UTD  shingrix 2/2  Hep B immune hep C neg  Check fasting labs   UTD colonoscopy UTD PSA 03/31/2018 Dr. Alinda Money 1.34 will hold for this currently normal had reduced per Dr. Alinda Money rec healthy diet choices and exercise   Essential hypertension - Plan: amLODipine (NORVASC) 10 MG tablet increased cont other meds  Monitor BP   Type 2 diabetes mellitus without complication, without long-term current use of insulin ([place]) - Plan: metFORMIN (GLUCOPHAGE) 500 MG tablet bid   Kidney stones F/u Dr. Alinda Money 03/26/2019 appt [facility] 03/26/19 kidney stones, elevated PSA 5.7 10/2017, PSA 03/2018 1.34, right proximal ureteral stricuture/ED lasix renal scan with decreased relative renal function right kidney/atrophic right kidney with dilated collecting system no obstruction, marked asymmetric renal function 80% left 20% right still with multiple stones 2 cm mass right kidney ? Mass  rec CT ab with and w/o iv contrast further evaluated 04/09/19  H/o elevated PSA PSa remains normal f/u in 1 year PSA and DRE rec K  citrate 20 meqbid   -we discussed possible serious and likely etiologies, options for evaluation and workup, limitations of telemedicine visit vs in person visit, treatment, treatment risks and precautions. Pt prefers to treat via telemedicine empirically rather then risking or undertaking an in person visit at this moment. Patient agrees to seek prompt in person care if worsening, new symptoms arise, or if is not improving with treatment.   I discussed the assessment and treatment plan with the patient. The patient was provided an opportunity to ask questions and all were answered. The patient agreed with the plan and demonstrated an understanding of the instructions.   The patient was advised to call back or seek an in-person evaluation if the symptoms worsen or if the condition fails to improve as anticipated.  Time spent 15 minutes  Delorise Jackson, MD

## 2019-02-05 ENCOUNTER — Other Ambulatory Visit (HOSPITAL_COMMUNITY): Payer: Self-pay | Admitting: Urology

## 2019-02-05 ENCOUNTER — Other Ambulatory Visit: Payer: Self-pay | Admitting: Urology

## 2019-02-05 DIAGNOSIS — N135 Crossing vessel and stricture of ureter without hydronephrosis: Secondary | ICD-10-CM

## 2019-02-26 ENCOUNTER — Other Ambulatory Visit (INDEPENDENT_AMBULATORY_CARE_PROVIDER_SITE_OTHER): Payer: No Typology Code available for payment source

## 2019-02-26 ENCOUNTER — Other Ambulatory Visit: Payer: Self-pay

## 2019-02-26 DIAGNOSIS — I10 Essential (primary) hypertension: Secondary | ICD-10-CM | POA: Diagnosis not present

## 2019-02-26 DIAGNOSIS — Z1329 Encounter for screening for other suspected endocrine disorder: Secondary | ICD-10-CM | POA: Diagnosis not present

## 2019-02-26 DIAGNOSIS — E119 Type 2 diabetes mellitus without complications: Secondary | ICD-10-CM

## 2019-02-26 LAB — CBC WITH DIFFERENTIAL/PLATELET
Basophils Absolute: 0.1 10*3/uL (ref 0.0–0.1)
Basophils Relative: 1 % (ref 0.0–3.0)
Eosinophils Absolute: 0.1 10*3/uL (ref 0.0–0.7)
Eosinophils Relative: 2.1 % (ref 0.0–5.0)
HCT: 44 % (ref 39.0–52.0)
Hemoglobin: 15.2 g/dL (ref 13.0–17.0)
Lymphocytes Relative: 19.8 % (ref 12.0–46.0)
Lymphs Abs: 1.4 10*3/uL (ref 0.7–4.0)
MCHC: 34.5 g/dL (ref 30.0–36.0)
MCV: 87.8 fl (ref 78.0–100.0)
Monocytes Absolute: 0.5 10*3/uL (ref 0.1–1.0)
Monocytes Relative: 6.6 % (ref 3.0–12.0)
Neutro Abs: 5 10*3/uL (ref 1.4–7.7)
Neutrophils Relative %: 70.5 % (ref 43.0–77.0)
Platelets: 339 10*3/uL (ref 150.0–400.0)
RBC: 5.01 Mil/uL (ref 4.22–5.81)
RDW: 13.5 % (ref 11.5–15.5)
WBC: 7.1 10*3/uL (ref 4.0–10.5)

## 2019-02-26 LAB — COMPREHENSIVE METABOLIC PANEL
ALT: 23 U/L (ref 0–53)
AST: 34 U/L (ref 0–37)
Albumin: 4.3 g/dL (ref 3.5–5.2)
Alkaline Phosphatase: 59 U/L (ref 39–117)
BUN: 25 mg/dL — ABNORMAL HIGH (ref 6–23)
CO2: 30 mEq/L (ref 19–32)
Calcium: 9.2 mg/dL (ref 8.4–10.5)
Chloride: 100 mEq/L (ref 96–112)
Creatinine, Ser: 1.17 mg/dL (ref 0.40–1.50)
GFR: 63.52 mL/min (ref >60.00)
Glucose, Bld: 124 mg/dL — ABNORMAL HIGH (ref 70–99)
Potassium: 4 mEq/L (ref 3.5–5.1)
Sodium: 136 mEq/L (ref 135–145)
Total Bilirubin: 0.7 mg/dL (ref 0.2–1.2)
Total Protein: 7.6 g/dL (ref 6.0–8.3)

## 2019-02-26 LAB — LIPID PANEL
Cholesterol: 120 mg/dL (ref 0–200)
HDL: 46.9 mg/dL (ref >39.00)
LDL Cholesterol: 62 mg/dL (ref 0–99)
NonHDL: 72.9
Total CHOL/HDL Ratio: 3
Triglycerides: 57 mg/dL (ref 0.0–149.0)
VLDL: 11.4 mg/dL (ref 0.0–40.0)

## 2019-02-26 LAB — HEMOGLOBIN A1C: Hgb A1c MFr Bld: 6.7 % — ABNORMAL HIGH (ref 4.6–6.5)

## 2019-02-26 LAB — TSH: TSH: 1.52 u[IU]/mL (ref 0.35–4.50)

## 2019-02-27 LAB — MICROALBUMIN / CREATININE URINE RATIO
Creatinine, Urine: 140 mg/dL (ref 20–320)
Microalb Creat Ratio: 6 mcg/mg creat (ref <30)
Microalb, Ur: 0.9 mg/dL

## 2019-03-09 ENCOUNTER — Other Ambulatory Visit: Payer: Self-pay

## 2019-03-09 ENCOUNTER — Ambulatory Visit (HOSPITAL_COMMUNITY)
Admission: RE | Admit: 2019-03-09 | Discharge: 2019-03-09 | Disposition: A | Discharge status: Home / Self Care | Payer: No Typology Code available for payment source | Source: Ambulatory Visit | Attending: Urology | Admitting: Urology

## 2019-03-09 DIAGNOSIS — N135 Crossing vessel and stricture of ureter without hydronephrosis: Secondary | ICD-10-CM | POA: Diagnosis present

## 2019-03-09 MED ORDER — FUROSEMIDE 10 MG/ML IJ SOLN
INTRAMUSCULAR | Status: AC
  Filled 2019-03-09: qty 8

## 2019-03-09 MED ORDER — TECHNETIUM TC 99M MERTIATIDE
5.3000 | Freq: Once | INTRAVENOUS | Status: AC | PRN
  Administered 2019-03-09: 10:00:00 5.3 via INTRAVENOUS

## 2019-03-31 ENCOUNTER — Encounter: Payer: Self-pay | Admitting: Internal Medicine

## 2019-03-31 NOTE — Progress Notes (Signed)
Received office note from [facility] and placed on Dr Audrie Gallus desk.

## 2019-04-05 ENCOUNTER — Encounter: Payer: Self-pay | Admitting: Internal Medicine

## 2019-10-29 ENCOUNTER — Other Ambulatory Visit: Payer: Self-pay | Admitting: Urology

## 2019-12-21 ENCOUNTER — Other Ambulatory Visit: Payer: Self-pay

## 2019-12-21 ENCOUNTER — Other Ambulatory Visit: Payer: Self-pay | Admitting: Internal Medicine

## 2019-12-21 DIAGNOSIS — I10 Essential (primary) hypertension: Secondary | ICD-10-CM

## 2019-12-21 MED ORDER — TELMISARTAN 80 MG PO TABS: 80.0000 mg | ORAL_TABLET | Freq: Every day | ORAL | 0 refills | Status: DC

## 2019-12-31 ENCOUNTER — Ambulatory Visit: Payer: No Typology Code available for payment source | Attending: Internal Medicine

## 2019-12-31 ENCOUNTER — Other Ambulatory Visit: Payer: Self-pay | Admitting: Internal Medicine

## 2019-12-31 DIAGNOSIS — Z23 Encounter for immunization: Secondary | ICD-10-CM

## 2019-12-31 NOTE — Progress Notes (Signed)
   Covid-19 Vaccination Clinic  Name:  Andrew Stevens    MRN: [id number] DOB: 1960/03/30  12/31/2019  Mr. Andrew Stevens was observed post Covid-19 immunization for 15 minutes without incident. He was provided with Vaccine Information Sheet and instruction to access the V-Safe system.   Mr. Andrew Stevens was instructed to call 911 with any severe reactions post vaccine: Marland Kitchen Difficulty breathing  . Swelling of face and throat  . A fast heartbeat  . A bad rash all over body  . Dizziness and weakness   Immunizations Administered    Name Date Dose VIS Date Route   Pfizer COVID-19 Vaccine 12/31/2019  1:53 PM 0.3 mL 11/17/2019 Intramuscular   Manufacturer: [place]   Lot: [id number]   [place]: [id number]

## 2020-01-10 ENCOUNTER — Other Ambulatory Visit: Payer: Self-pay | Admitting: Internal Medicine

## 2020-01-10 DIAGNOSIS — E119 Type 2 diabetes mellitus without complications: Secondary | ICD-10-CM

## 2020-01-10 DIAGNOSIS — I10 Essential (primary) hypertension: Secondary | ICD-10-CM

## 2020-01-10 MED ORDER — POTASSIUM CITRATE ER 10 MEQ (1080 MG) PO TBCR: 20.0000 meq | EXTENDED_RELEASE_TABLET | Freq: Two times a day (BID) | ORAL | 11 refills | Status: DC

## 2020-01-10 MED ORDER — ATORVASTATIN CALCIUM 20 MG PO TABS: 20.0000 mg | ORAL_TABLET | Freq: Every day | ORAL | 2 refills | Status: DC

## 2020-01-10 MED ORDER — CHLORTHALIDONE 25 MG PO TABS: 25.0000 mg | ORAL_TABLET | Freq: Every day | ORAL | 2 refills | Status: DC

## 2020-01-10 MED ORDER — METFORMIN HCL 500 MG PO TABS: 500.0000 mg | ORAL_TABLET | Freq: Two times a day (BID) | ORAL | 3 refills | Status: DC

## 2020-01-10 MED ORDER — AMLODIPINE BESYLATE 10 MG PO TABS: 10.0000 mg | ORAL_TABLET | ORAL | 2 refills | Status: DC

## 2020-01-10 MED ORDER — TELMISARTAN 80 MG PO TABS
80.0000 mg | ORAL_TABLET | Freq: Every day | ORAL | 2 refills | Status: DC
Start: 2020-01-10 — End: 2020-01-14

## 2020-01-13 ENCOUNTER — Encounter: Payer: Self-pay | Admitting: Internal Medicine

## 2020-01-14 ENCOUNTER — Ambulatory Visit (INDEPENDENT_AMBULATORY_CARE_PROVIDER_SITE_OTHER): Payer: No Typology Code available for payment source | Admitting: Internal Medicine

## 2020-01-14 ENCOUNTER — Encounter: Payer: Self-pay | Admitting: Internal Medicine

## 2020-01-14 ENCOUNTER — Other Ambulatory Visit: Payer: Self-pay

## 2020-01-14 VITALS — BP 122/80 | HR 74 | Temp 98.0°F | Ht 68.47 in | Wt 198.8 lb

## 2020-01-14 DIAGNOSIS — R7989 Other specified abnormal findings of blood chemistry: Secondary | ICD-10-CM

## 2020-01-14 DIAGNOSIS — E559 Vitamin D deficiency, unspecified: Secondary | ICD-10-CM | POA: Diagnosis not present

## 2020-01-14 DIAGNOSIS — Z125 Encounter for screening for malignant neoplasm of prostate: Secondary | ICD-10-CM

## 2020-01-14 DIAGNOSIS — I152 Hypertension secondary to endocrine disorders: Secondary | ICD-10-CM

## 2020-01-14 DIAGNOSIS — R3989 Other symptoms and signs involving the genitourinary system: Secondary | ICD-10-CM

## 2020-01-14 DIAGNOSIS — E1159 Type 2 diabetes mellitus with other circulatory complications: Secondary | ICD-10-CM

## 2020-01-14 DIAGNOSIS — I10 Essential (primary) hypertension: Secondary | ICD-10-CM

## 2020-01-14 DIAGNOSIS — Z1211 Encounter for screening for malignant neoplasm of colon: Secondary | ICD-10-CM

## 2020-01-14 DIAGNOSIS — Z87442 Personal history of urinary calculi: Secondary | ICD-10-CM

## 2020-01-14 DIAGNOSIS — K635 Polyp of colon: Secondary | ICD-10-CM

## 2020-01-14 DIAGNOSIS — Z0000 Encounter for general adult medical examination without abnormal findings: Secondary | ICD-10-CM | POA: Diagnosis not present

## 2020-01-14 DIAGNOSIS — E119 Type 2 diabetes mellitus without complications: Secondary | ICD-10-CM

## 2020-01-14 LAB — CBC WITH DIFFERENTIAL/PLATELET
Basophils Absolute: 0.1 10*3/uL (ref 0.0–0.1)
Basophils Relative: 0.7 % (ref 0.0–3.0)
Eosinophils Absolute: 0.1 10*3/uL (ref 0.0–0.7)
Eosinophils Relative: 1.6 % (ref 0.0–5.0)
HCT: 43.8 % (ref 39.0–52.0)
Hemoglobin: 14.9 g/dL (ref 13.0–17.0)
Lymphocytes Relative: 12 % (ref 12.0–46.0)
Lymphs Abs: 1 10*3/uL (ref 0.7–4.0)
MCHC: 34.1 g/dL (ref 30.0–36.0)
MCV: 88.7 fl (ref 78.0–100.0)
Monocytes Absolute: 0.5 10*3/uL (ref 0.1–1.0)
Monocytes Relative: 5.7 % (ref 3.0–12.0)
Neutro Abs: 6.5 10*3/uL (ref 1.4–7.7)
Neutrophils Relative %: 80 % — ABNORMAL HIGH (ref 43.0–77.0)
Platelets: 305 10*3/uL (ref 150.0–400.0)
RBC: 4.93 Mil/uL (ref 4.22–5.81)
RDW: 13.5 % (ref 11.5–15.5)
WBC: 8.1 10*3/uL (ref 4.0–10.5)

## 2020-01-14 LAB — COMPREHENSIVE METABOLIC PANEL
ALT: 18 U/L (ref 0–53)
AST: 14 U/L (ref 0–37)
Albumin: 4.3 g/dL (ref 3.5–5.2)
Alkaline Phosphatase: 54 U/L (ref 39–117)
BUN: 20 mg/dL (ref 6–23)
CO2: 32 mEq/L (ref 19–32)
Calcium: 9.5 mg/dL (ref 8.4–10.5)
Chloride: 101 mEq/L (ref 96–112)
Creatinine, Ser: 1.09 mg/dL (ref 0.40–1.50)
GFR: 73.42 mL/min (ref >60.00)
Glucose, Bld: 136 mg/dL — ABNORMAL HIGH (ref 70–99)
Potassium: 4.7 mEq/L (ref 3.5–5.1)
Sodium: 138 mEq/L (ref 135–145)
Total Bilirubin: 0.7 mg/dL (ref 0.2–1.2)
Total Protein: 7.2 g/dL (ref 6.0–8.3)

## 2020-01-14 LAB — LIPID PANEL
Cholesterol: 158 mg/dL (ref 0–200)
HDL: 50.3 mg/dL (ref >39.00)
LDL Cholesterol: 95 mg/dL (ref 0–99)
NonHDL: 107.36
Total CHOL/HDL Ratio: 3
Triglycerides: 60 mg/dL (ref 0.0–149.0)
VLDL: 12 mg/dL (ref 0.0–40.0)

## 2020-01-14 LAB — TESTOSTERONE: Testosterone: 438.26 ng/dL (ref 300.00–890.00)

## 2020-01-14 LAB — HEMOGLOBIN A1C: Hgb A1c MFr Bld: 6.5 % (ref 4.6–6.5)

## 2020-01-14 LAB — VITAMIN D 25 HYDROXY (VIT D DEFICIENCY, FRACTURES): VITD: 25.53 ng/mL — ABNORMAL LOW (ref 30.00–100.00)

## 2020-01-14 LAB — PSA: PSA: 1.74 ng/mL (ref 0.10–4.00)

## 2020-01-14 MED ORDER — METFORMIN HCL 500 MG PO TABS
500.0000 mg | ORAL_TABLET | Freq: Two times a day (BID) | ORAL | 3 refills | Status: DC
Start: 2020-01-14 — End: 2020-08-10

## 2020-01-14 MED ORDER — ATORVASTATIN CALCIUM 20 MG PO TABS: 20.0000 mg | ORAL_TABLET | Freq: Every day | ORAL | 3 refills | Status: DC

## 2020-01-14 MED ORDER — TELMISARTAN 80 MG PO TABS: 80.0000 mg | ORAL_TABLET | Freq: Every day | ORAL | 3 refills | Status: DC

## 2020-01-14 MED ORDER — AMLODIPINE BESYLATE 10 MG PO TABS: 10.0000 mg | ORAL_TABLET | ORAL | 3 refills | Status: DC

## 2020-01-14 MED ORDER — CHLORTHALIDONE 25 MG PO TABS: 25.0000 mg | ORAL_TABLET | Freq: Every day | ORAL | 3 refills | Status: DC

## 2020-01-14 NOTE — Progress Notes (Signed)
Chief Complaint  Patient presents with  . Annual Exam   Annual  1. DM 2 hld/htn controlled  norvasc 10 mg qd, chlorthalidone 25 mg qd, lipitor 20 mg qd, metformin 500 mg qd, micardis   Review of Systems  Constitutional: Negative for weight loss.  HENT: Negative for hearing loss.   Eyes: Negative for blurred vision.  Respiratory: Negative for shortness of breath.   Cardiovascular: Negative for chest pain.  Gastrointestinal: Negative for abdominal pain.  Musculoskeletal: Negative for falls and joint pain.  Skin: Negative for rash.  Neurological: Negative for headaches.  Psychiatric/Behavioral: Negative for depression.   Past Medical History:  Diagnosis Date  . Anxiety   . Diabetes mellitus without complication ([place])   . Difficult intubation   . Erectile dysfunction   . GERD (gastroesophageal reflux disease)    occ. takes rolaids or tums if needed  . Headache    hisotry of with brain tumor  . History of brain tumor   . History of kidney stones 2019   Bilateral  . History of umbilical hernia   . Hydronephrosis 2019   Moderate right hydronephrosis  . Hyperlipidemia   . Hypertension   . Lipoma of extremity    left inner thigh, right groin   Past Surgical History:  Procedure Laterality Date  . BRAIN SURGERY  1989   Cyst removed - benign  . COLONOSCOPY  2015  . CYSTOSCOPY W/ URETERAL STENT PLACEMENT Right 05/27/2017   Procedure: CYSTOSCOPY WITH STENT REPLACEMENT;  Surgeon: [name], MD;  Location: [facility];  Service: Urology;  Laterality: Right;  . CYSTOSCOPY WITH STENT PLACEMENT Right 01/23/2018   Procedure: CYSTOSCOPY WITH STENT PLACEMENT;  Surgeon: Billey Co, MD;  Location: [facility];  Service: Urology;  Laterality: Right;  . CYSTOSCOPY/RETROGRADE/URETEROSCOPY Right 01/23/2018   Procedure: CYSTOSCOPY/RETROGRADE/URETEROSCOPY;  Surgeon: Billey Co, MD;  Location: [facility];  Service: Urology;  Laterality: Right;  . CYSTOSCOPY/URETEROSCOPY/HOLMIUM LASER  Right 05/27/2017   Procedure: CYSTOSCOPY/URETEROSCOPY/HOLMIUM LASER;  Surgeon: [name], MD;  Location: [facility];  Service: Urology;  Laterality: Right;  . CYSTOSCOPY/URETEROSCOPY/HOLMIUM LASER/STENT PLACEMENT Right 04/11/2017   Procedure: CYSTOSCOPY/URETEROSCOPY/HOLMIUM LASER/STENT PLACEMENT;  Surgeon: [name], MD;  Location: [facility];  Service: Urology;  Laterality: Right;  . CYSTOSCOPY/URETEROSCOPY/HOLMIUM LASER/STENT PLACEMENT Right 06/18/2018   Procedure: CYSTOSCOPY/ RETROGRADE//URETEROSCOPY/HOLMIUM LASER/STENT PLACEMENT;  Surgeon: Raynelle Bring, MD;  Location: [facility];  Service: Urology;  Laterality: Right;  . CYSTOSCOPY/URETEROSCOPY/HOLMIUM LASER/STENT PLACEMENT Right 11/23/2018   Procedure: CYSTOSCOPY/RETROGRADE PYELOGRAM/URETEROSCOPY/HOLMIUM LASER/STENT PLACEMENT;  Surgeon: Raynelle Bring, MD;  Location: [facility];  Service: Urology;  Laterality: Right;  . INGUINAL HERNIA REPAIR    . MASS EXCISION Bilateral 04/22/2017   Procedure: EXCISION OF 3  MASSES OF PERIRECTAL, INNER THIGH AREAS;  Surgeon: Johnathan Hausen, MD;  Location: [place];  Service: General;  Laterality: Bilateral;  LMA  . ROTATOR CUFF REPAIR Right 2014   Dr. Mack Guise  . VENTRAL HERNIA REPAIR     Family History  Problem Relation Age of Onset  . Hypertension Mother   . Rheum arthritis Mother   . Alcohol abuse Brother   . Heart attack Sister   . Early death Sister        During childbirth  . Thyroid disease Sister   . Colon cancer Neg Hx   . Prostate cancer Neg Hx    Social History   Socioeconomic History  . Marital status: Married    Spouse name: Not on file  . Number of children:  2  . Years of education: Not on file  . Highest education level: Not on file  Occupational History  . 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 Smoking status: Never Smoker  . Smokeless tobacco: Never Used  Vaping Use  . Vaping Use: Never used  Substance and Sexual Activity  . Alcohol use: Yes     Alcohol/week: 0.0 standard drinks    Comment: Occasional  . Drug use: No  . Sexual activity: Yes    Comment: wife   Other Topics Concern  . Not on file  Social History Narrative   Lives in [place] with wife. Dog. [place].      3 grandchildren, 2 children, married to wife x 47 years as of 01/08/19   From [facility]      Regular Exercise -  NO   Daily Caffeine Use:  4 cups coffee in AM, 1 cup tea in AM         Social Determinants of Health   Financial Resource Strain: Not on file  Food Insecurity: Not on file  Transportation Needs: Not on file  Physical Activity: Not on file  Stress: Not on file  Social Connections: Not on file  Intimate Partner Violence: Not on file   Current Meds  Medication Sig  . potassium citrate (UROCIT-K) 10 MEQ (1080 MG) SR tablet Take 2 tablets (20 mEq total) by mouth 2 (two) times daily.  . sildenafil (VIAGRA) 100 MG tablet Take 100 mg by mouth as directed.  . [DISCONTINUED] amLODipine (NORVASC) 10 MG tablet Take 1 tablet (10 mg total) by mouth every morning.  . [DISCONTINUED] atorvastatin (LIPITOR) 20 MG tablet Take 1 tablet (20 mg total) by mouth at bedtime.  . [DISCONTINUED] chlorthalidone (HYGROTON) 25 MG tablet Take 1 tablet (25 mg total) by mouth daily.  . [DISCONTINUED] metFORMIN (GLUCOPHAGE) 500 MG tablet Take 1 tablet (500 mg total) by mouth 2 (two) times daily with a meal.  . [DISCONTINUED] telmisartan (MICARDIS) 80 MG tablet Take 1 tablet (80 mg total) by mouth daily.   No Known Allergies No results found for this or any previous visit (from the past 2160 hour(s)). Objective  Body mass index is 29.82 kg/m. Wt Readings from Last 3 Encounters:  01/14/20 198 lb 12.8 oz (90.2 kg)  01/08/19 199 lb (90.3 kg)  11/23/18 199 lb 9.6 oz (90.5 kg)   Temp Readings from Last 3 Encounters:  01/14/20 98 F (36.7 C) (Oral)  11/23/18 (!) 97.3 F (36.3 C)  11/19/18 97.8 F (36.6 C) (Oral)   BP Readings from Last 3 Encounters:   01/14/20 122/80  11/23/18 126/90  11/19/18 (!) 143/80   Pulse Readings from Last 3 Encounters:  01/14/20 74  11/23/18 79  11/19/18 64    Physical Exam Vitals and nursing note reviewed.  Constitutional:      Appearance: Normal appearance. He is well-developed, well-groomed and overweight.  HENT:     Head: Normocephalic and atraumatic.  Eyes:     Conjunctiva/sclera: Conjunctivae normal.     Pupils: Pupils are equal, round, and reactive to light.  Cardiovascular:     Rate and Rhythm: Normal rate and regular rhythm.     Heart sounds: Normal heart sounds. No murmur heard.   Pulmonary:     Effort: Pulmonary effort is normal.     Breath sounds: Normal breath sounds.  Abdominal:     General: Abdomen is flat. Bowel sounds are normal.  Tenderness: There is no abdominal tenderness.  Skin:    General: Skin is warm and dry.  Neurological:     General: No focal deficit present.     Mental Status: He is alert and oriented to person, place, and time. Mental status is at baseline.     Gait: Gait normal.  Psychiatric:        Attention and Perception: Attention and perception normal.        Mood and Affect: Mood and affect normal.        Speech: Speech normal.        Behavior: Behavior normal. Behavior is cooperative.        Thought Content: Thought content normal.        Cognition and Memory: Cognition and memory normal.        Judgment: Judgment normal.     Assessment  Plan  Annual physical exam Had flu shotutd Tdaputd pna 23 UTD  shingrix2/2  [employer] 3/3  Hep B immune hep C neg  Check fasting labs   UTD colonoscopy DUE Dr. Allen Norris referral placed   UTD PSA3/03/2018 Dr. Alinda Money 1.34 will hold for this currently normal had reduced per Dr. Alinda Money rec healthy diet choices and exercise   Kidney stones F/u Dr. Alinda Money 03/26/2019 and will CT renal and then f/u 02/2020   appt [facility] 03/26/19 kidney stones, elevated PSA 5.7 10/2017, PSA 03/2018 1.34, right proximal  ureteral stricuture/ED lasix renal scan with decreased relative renal function right kidney/atrophic right kidney with dilated collecting system no obstruction, marked asymmetric renal function 80% left 20% right still with multiple stones 2 cm mass right kidney ? Mass  rec CT ab with and w/o iv contrast further evaluated 04/09/19  H/o elevated PSA PSa remains normal f/u in 1 year PSA and DRE rec K citrate 20 meqbid   rec healthy diet and exercise   Hypertension associated with diabetes ([place]) controlled - Plan: Comprehensive metabolic panel, Lipid panel, CBC with Differential/Platelet, Hemoglobin A1c Cont meds  Essential hypertension - Plan: telmisartan (MICARDIS) 80 MG tablet, chlorthalidone (HYGROTON) 25 MG tablet, amLODipine (NORVASC) 10 MG tablet   Provider: Dr. Olivia Mackie McLean-Scocuzza-Internal Medicine

## 2020-01-14 NOTE — Patient Instructions (Signed)
Let me know if you want to see [facility] Dr. Gollan/Arida/End/Dr. Aaron Edelman

## 2020-02-23 ENCOUNTER — Telehealth: Payer: Self-pay | Admitting: Internal Medicine

## 2020-02-23 NOTE — Telephone Encounter (Signed)
Patient wife called, [phone]. Patient can not see Dr. Raynelle Bring until  June 2022. Patient is having pressure in his kidneys. Patient's wife stated they did receive a letter from Dr. Lynne Logan office before the holidays to call and schedule an appointment. Patient's wife called office and they said before he could be seen he would have to have a MRI or a CT scan. She was told someone would call, no one ever called patient back. Patient's wife called Dr. Lynne Logan office and they can not see him until June and nothing was said about getting a MRI or CT scan. Is there anything Dr. Aundra Dubin can do to get patient into see Dr. Alinda Money before June? Patient has [employer] and also needs a referral to Dr. Alinda Money.

## 2020-02-24 NOTE — Telephone Encounter (Signed)
Please advise 

## 2020-02-24 NOTE — Telephone Encounter (Signed)
Patient is needing a referral.

## 2020-02-24 NOTE — Telephone Encounter (Signed)
He can see another urologist in the office does anyone else have availability please call  Do they need a referral?

## 2020-02-28 ENCOUNTER — Other Ambulatory Visit: Payer: Self-pay | Admitting: Urology

## 2020-02-28 ENCOUNTER — Other Ambulatory Visit (HOSPITAL_COMMUNITY): Payer: Self-pay | Admitting: Urology

## 2020-02-28 DIAGNOSIS — N135 Crossing vessel and stricture of ureter without hydronephrosis: Secondary | ICD-10-CM

## 2020-02-28 NOTE — Telephone Encounter (Signed)
Referral sent per wife needs MRI/CT will see if urology can get this done and also needs f/u urology asap if not with Dr. Alinda Money then who he recs as far as colleague

## 2020-02-28 NOTE — Telephone Encounter (Signed)
Referral received and sent. °

## 2020-02-28 NOTE — Telephone Encounter (Signed)
Thank you :)

## 2020-02-28 NOTE — Addendum Note (Signed)
Addended by: Orland Mustard on: 02/28/2020 01:12 PM   Modules accepted: Orders

## 2020-02-29 NOTE — Telephone Encounter (Signed)
Left message to return call with any further questions.

## 2020-03-10 ENCOUNTER — Ambulatory Visit (HOSPITAL_COMMUNITY)
Admission: RE | Admit: 2020-03-10 | Discharge: 2020-03-10 | Disposition: A | Discharge status: Home / Self Care | Payer: No Typology Code available for payment source | Source: Ambulatory Visit | Attending: Urology | Admitting: Urology

## 2020-03-10 ENCOUNTER — Other Ambulatory Visit: Payer: Self-pay

## 2020-03-10 DIAGNOSIS — N135 Crossing vessel and stricture of ureter without hydronephrosis: Secondary | ICD-10-CM | POA: Diagnosis present

## 2020-03-10 MED ORDER — FUROSEMIDE 10 MG/ML IJ SOLN
INTRAMUSCULAR | Status: AC
  Filled 2020-03-10: qty 8

## 2020-03-10 MED ORDER — FUROSEMIDE 10 MG/ML IJ SOLN
45.0000 mg | Freq: Once | INTRAMUSCULAR | Status: AC
  Administered 2020-03-10: 45 mg via INTRAVENOUS

## 2020-03-10 MED ORDER — TECHNETIUM TC 99M MERTIATIDE
5.1900 | Freq: Once | INTRAVENOUS | Status: AC | PRN
  Administered 2020-03-10: 5.19 via INTRAVENOUS

## 2020-05-16 ENCOUNTER — Other Ambulatory Visit: Payer: Self-pay

## 2020-05-16 MED FILL — Metformin HCl Tab 500 MG: ORAL | 90 days supply | Qty: 180 | Fill #0 | Status: AC

## 2020-05-16 MED FILL — Sildenafil Citrate Tab 100 MG: ORAL | 30 days supply | Qty: 6 | Fill #0 | Status: AC

## 2020-05-16 MED FILL — Amlodipine Besylate Tab 10 MG (Base Equivalent): ORAL | 90 days supply | Qty: 90 | Fill #0 | Status: AC

## 2020-05-16 MED FILL — Potassium Citrate Tab ER 10 MEQ (1080 MG): ORAL | 30 days supply | Qty: 120 | Fill #0 | Status: AC

## 2020-05-17 ENCOUNTER — Other Ambulatory Visit: Payer: Self-pay

## 2020-05-19 ENCOUNTER — Other Ambulatory Visit: Payer: Self-pay

## 2020-06-16 ENCOUNTER — Other Ambulatory Visit: Payer: Self-pay

## 2020-06-16 MED FILL — Sildenafil Citrate Tab 100 MG: ORAL | 30 days supply | Qty: 6 | Fill #1 | Status: AC

## 2020-06-20 ENCOUNTER — Emergency Department: Payer: No Typology Code available for payment source | Admitting: Anesthesiology

## 2020-06-20 ENCOUNTER — Encounter
Admission: EM | Disposition: A | Discharge status: Home / Self Care | Payer: Self-pay | Source: Home / Self Care | Attending: Emergency Medicine

## 2020-06-20 ENCOUNTER — Emergency Department: Payer: No Typology Code available for payment source

## 2020-06-20 ENCOUNTER — Ambulatory Visit
Admission: EM | Admit: 2020-06-20 | Discharge: 2020-06-20 | Disposition: A | Discharge status: Home / Self Care | Payer: No Typology Code available for payment source | Attending: Emergency Medicine | Admitting: Emergency Medicine

## 2020-06-20 ENCOUNTER — Other Ambulatory Visit: Payer: Self-pay

## 2020-06-20 ENCOUNTER — Encounter: Payer: Self-pay | Admitting: Emergency Medicine

## 2020-06-20 DIAGNOSIS — Z87442 Personal history of urinary calculi: Secondary | ICD-10-CM | POA: Diagnosis not present

## 2020-06-20 DIAGNOSIS — I10 Essential (primary) hypertension: Secondary | ICD-10-CM | POA: Diagnosis not present

## 2020-06-20 DIAGNOSIS — N201 Calculus of ureter: Secondary | ICD-10-CM

## 2020-06-20 DIAGNOSIS — E119 Type 2 diabetes mellitus without complications: Secondary | ICD-10-CM | POA: Diagnosis not present

## 2020-06-20 DIAGNOSIS — N400 Enlarged prostate without lower urinary tract symptoms: Secondary | ICD-10-CM | POA: Diagnosis not present

## 2020-06-20 DIAGNOSIS — N179 Acute kidney failure, unspecified: Secondary | ICD-10-CM

## 2020-06-20 DIAGNOSIS — Z8249 Family history of ischemic heart disease and other diseases of the circulatory system: Secondary | ICD-10-CM | POA: Insufficient documentation

## 2020-06-20 DIAGNOSIS — Z8349 Family history of other endocrine, nutritional and metabolic diseases: Secondary | ICD-10-CM | POA: Insufficient documentation

## 2020-06-20 DIAGNOSIS — Z7984 Long term (current) use of oral hypoglycemic drugs: Secondary | ICD-10-CM | POA: Diagnosis not present

## 2020-06-20 DIAGNOSIS — K219 Gastro-esophageal reflux disease without esophagitis: Secondary | ICD-10-CM | POA: Insufficient documentation

## 2020-06-20 DIAGNOSIS — N1330 Unspecified hydronephrosis: Secondary | ICD-10-CM

## 2020-06-20 DIAGNOSIS — E785 Hyperlipidemia, unspecified: Secondary | ICD-10-CM | POA: Insufficient documentation

## 2020-06-20 DIAGNOSIS — N132 Hydronephrosis with renal and ureteral calculous obstruction: Secondary | ICD-10-CM | POA: Diagnosis not present

## 2020-06-20 DIAGNOSIS — Z79899 Other long term (current) drug therapy: Secondary | ICD-10-CM | POA: Insufficient documentation

## 2020-06-20 HISTORY — PX: CYSTOSCOPY WITH STENT PLACEMENT: SHX5790

## 2020-06-20 LAB — CBC WITH DIFFERENTIAL/PLATELET
Abs Immature Granulocytes: 0.04 10*3/uL (ref 0.00–0.07)
Basophils Absolute: 0.1 10*3/uL (ref 0.0–0.1)
Basophils Relative: 1 %
Eosinophils Absolute: 0.2 10*3/uL (ref 0.0–0.5)
Eosinophils Relative: 3 %
HCT: 39.6 % (ref 39.0–52.0)
Hemoglobin: 13.9 g/dL (ref 13.0–17.0)
Immature Granulocytes: 1 %
Lymphocytes Relative: 15 %
Lymphs Abs: 1 10*3/uL (ref 0.7–4.0)
MCH: 30.6 pg (ref 26.0–34.0)
MCHC: 35.1 g/dL (ref 30.0–36.0)
MCV: 87.2 fL (ref 80.0–100.0)
Monocytes Absolute: 0.5 10*3/uL (ref 0.1–1.0)
Monocytes Relative: 7 %
Neutro Abs: 5 10*3/uL (ref 1.7–7.7)
Neutrophils Relative %: 73 %
Platelets: 274 10*3/uL (ref 150–400)
RBC: 4.54 MIL/uL (ref 4.22–5.81)
RDW: 12.6 % (ref 11.5–15.5)
WBC: 6.9 10*3/uL (ref 4.0–10.5)
nRBC: 0 % (ref 0.0–0.2)

## 2020-06-20 LAB — URINALYSIS, COMPLETE (UACMP) WITH MICROSCOPIC
Bilirubin Urine: NEGATIVE
Glucose, UA: NEGATIVE mg/dL
Ketones, ur: NEGATIVE mg/dL
Nitrite: NEGATIVE
Protein, ur: NEGATIVE mg/dL
Specific Gravity, Urine: 1.013 (ref 1.005–1.030)
Squamous Epithelial / HPF: NONE SEEN (ref 0–5)
pH: 5 (ref 5.0–8.0)

## 2020-06-20 LAB — COMPREHENSIVE METABOLIC PANEL
ALT: 16 U/L (ref 0–44)
AST: 18 U/L (ref 15–41)
Albumin: 4 g/dL (ref 3.5–5.0)
Alkaline Phosphatase: 44 U/L (ref 38–126)
Anion gap: 11 (ref 5–15)
BUN: 39 mg/dL — ABNORMAL HIGH (ref 8–23)
CO2: 27 mmol/L (ref 22–32)
Calcium: 9.2 mg/dL (ref 8.9–10.3)
Chloride: 100 mmol/L (ref 98–111)
Creatinine, Ser: 2.85 mg/dL — ABNORMAL HIGH (ref 0.61–1.24)
GFR, Estimated: 24 mL/min — ABNORMAL LOW (ref >60)
Glucose, Bld: 119 mg/dL — ABNORMAL HIGH (ref 70–99)
Potassium: 4 mmol/L (ref 3.5–5.1)
Sodium: 138 mmol/L (ref 135–145)
Total Bilirubin: 0.7 mg/dL (ref 0.3–1.2)
Total Protein: 7.2 g/dL (ref 6.5–8.1)

## 2020-06-20 LAB — CBG MONITORING, ED: Glucose-Capillary: 110 mg/dL — ABNORMAL HIGH (ref 70–99)

## 2020-06-20 LAB — LIPASE, BLOOD: Lipase: 30 U/L (ref 11–51)

## 2020-06-20 SURGERY — CYSTOSCOPY, WITH STENT INSERTION
Anesthesia: General | Laterality: Left

## 2020-06-20 MED ORDER — ONDANSETRON HCL 4 MG/2ML IJ SOLN
4.0000 mg | Freq: Once | INTRAMUSCULAR | Status: AC
  Administered 2020-06-20: 4 mg via INTRAVENOUS
  Filled 2020-06-20: qty 2

## 2020-06-20 MED ORDER — MORPHINE SULFATE (PF) 4 MG/ML IV SOLN
4.0000 mg | Freq: Once | INTRAVENOUS | Status: AC
  Administered 2020-06-20: 4 mg via INTRAVENOUS
  Filled 2020-06-20: qty 1

## 2020-06-20 MED ORDER — MORPHINE SULFATE (PF) 4 MG/ML IV SOLN
4.0000 mg | Freq: Once | INTRAVENOUS | Status: AC
Start: 2020-06-20 — End: 2020-06-20
  Administered 2020-06-20: 4 mg via INTRAVENOUS
  Filled 2020-06-20: qty 1

## 2020-06-20 MED ORDER — MIDAZOLAM HCL 2 MG/2ML IJ SOLN
INTRAMUSCULAR | Status: AC
  Filled 2020-06-20: qty 2

## 2020-06-20 MED ORDER — CEFAZOLIN SODIUM-DEXTROSE 2-3 GM-%(50ML) IV SOLR
INTRAVENOUS | Status: DC | PRN
  Administered 2020-06-20: 2 g via INTRAVENOUS

## 2020-06-20 MED ORDER — OXYCODONE HCL 5 MG PO TABS
ORAL_TABLET | ORAL | Status: AC
  Filled 2020-06-20: qty 1

## 2020-06-20 MED ORDER — SEVOFLURANE IN SOLN
RESPIRATORY_TRACT | Status: AC
  Filled 2020-06-20: qty 250

## 2020-06-20 MED ORDER — FENTANYL CITRATE (PF) 100 MCG/2ML IJ SOLN
INTRAMUSCULAR | Status: AC
  Filled 2020-06-20: qty 2

## 2020-06-20 MED ORDER — EPHEDRINE SULFATE 50 MG/ML IJ SOLN
INTRAMUSCULAR | Status: DC | PRN
  Administered 2020-06-20: 5 mg via INTRAVENOUS

## 2020-06-20 MED ORDER — SODIUM CHLORIDE 0.9 % IV SOLN: INTRAVENOUS | Status: DC | PRN

## 2020-06-20 MED ORDER — PROPOFOL 10 MG/ML IV BOLUS
INTRAVENOUS | Status: AC
  Filled 2020-06-20: qty 20

## 2020-06-20 MED ORDER — OXYCODONE-ACETAMINOPHEN 5-325 MG PO TABS: 1.0000 | ORAL_TABLET | Freq: Once | ORAL | Status: DC

## 2020-06-20 MED ORDER — LIDOCAINE HCL (CARDIAC) PF 100 MG/5ML IV SOSY
PREFILLED_SYRINGE | INTRAVENOUS | Status: DC | PRN
  Administered 2020-06-20: 80 mg via INTRAVENOUS

## 2020-06-20 MED ORDER — FENTANYL CITRATE (PF) 100 MCG/2ML IJ SOLN
25.0000 ug | INTRAMUSCULAR | Status: DC | PRN
Start: 2020-06-20 — End: 2020-06-21
  Administered 2020-06-20: 50 ug via INTRAVENOUS

## 2020-06-20 MED ORDER — SODIUM CHLORIDE 0.9 % IV BOLUS
1000.0000 mL | Freq: Once | INTRAVENOUS | Status: AC
  Administered 2020-06-20: 1000 mL via INTRAVENOUS

## 2020-06-20 MED ORDER — PROPOFOL 10 MG/ML IV BOLUS
INTRAVENOUS | Status: DC | PRN
  Administered 2020-06-20: 160 mg via INTRAVENOUS

## 2020-06-20 MED ORDER — FENTANYL CITRATE (PF) 100 MCG/2ML IJ SOLN
INTRAMUSCULAR | Status: DC | PRN
  Administered 2020-06-20: 50 ug via INTRAVENOUS

## 2020-06-20 MED ORDER — OXYCODONE HCL 5 MG PO TABS
5.0000 mg | ORAL_TABLET | Freq: Once | ORAL | Status: AC | PRN
  Administered 2020-06-20: 5 mg via ORAL

## 2020-06-20 MED ORDER — TAMSULOSIN HCL 0.4 MG PO CAPS: 0.4000 mg | ORAL_CAPSULE | Freq: Every day | ORAL | 0 refills | Status: DC

## 2020-06-20 MED ORDER — PHENYLEPHRINE HCL (PRESSORS) 10 MG/ML IV SOLN
INTRAVENOUS | Status: DC | PRN
  Administered 2020-06-20: 80 ug via INTRAVENOUS

## 2020-06-20 MED ORDER — SUCCINYLCHOLINE CHLORIDE 20 MG/ML IJ SOLN
INTRAMUSCULAR | Status: DC | PRN
  Administered 2020-06-20: 160 mg via INTRAVENOUS

## 2020-06-20 MED ORDER — MIDAZOLAM HCL 2 MG/2ML IJ SOLN
INTRAMUSCULAR | Status: DC | PRN
  Administered 2020-06-20: 2 mg via INTRAVENOUS

## 2020-06-20 MED ORDER — FENTANYL CITRATE (PF) 100 MCG/2ML IJ SOLN
INTRAMUSCULAR | Status: AC
  Administered 2020-06-20: 50 ug via INTRAVENOUS
  Filled 2020-06-20: qty 2

## 2020-06-20 MED ORDER — OXYCODONE HCL 5 MG/5ML PO SOLN: 5.0000 mg | Freq: Once | ORAL | Status: AC | PRN

## 2020-06-20 MED ORDER — OXYBUTYNIN CHLORIDE 5 MG PO TABS: ORAL_TABLET | ORAL | 0 refills | Status: DC

## 2020-06-20 MED ORDER — IOHEXOL 180 MG/ML  SOLN
INTRAMUSCULAR | Status: DC | PRN
  Administered 2020-06-20: 5 mL

## 2020-06-20 MED ORDER — ONDANSETRON HCL 4 MG/2ML IJ SOLN
INTRAMUSCULAR | Status: DC | PRN
  Administered 2020-06-20: 4 mg via INTRAVENOUS

## 2020-06-20 MED ORDER — ONDANSETRON 4 MG PO TBDP: 4.0000 mg | ORAL_TABLET | Freq: Three times a day (TID) | ORAL | 0 refills | Status: DC | PRN

## 2020-06-20 MED ORDER — OXYCODONE-ACETAMINOPHEN 5-325 MG PO TABS: 1.0000 | ORAL_TABLET | ORAL | 0 refills | Status: DC | PRN

## 2020-06-20 SURGICAL SUPPLY — 14 items
BAG DRAIN CYSTO-URO LG1000N (MISCELLANEOUS) ×2 IMPLANT
BRUSH SCRUB EZ 1% IODOPHOR (MISCELLANEOUS) ×2 IMPLANT
CATH URETL 5X70 OPEN END (CATHETERS) ×1 IMPLANT
GLOVE SURG UNDER POLY LF SZ7.5 (GLOVE) ×2 IMPLANT
GOWN STRL REUS W/ TWL XL LVL3 (GOWN DISPOSABLE) ×1 IMPLANT
GOWN STRL REUS W/TWL XL LVL3 (GOWN DISPOSABLE) ×1
GUIDEWIRE STR DUAL SENSOR (WIRE) ×2 IMPLANT
KIT TURNOVER CYSTO (KITS) ×2 IMPLANT
PACK CYSTO AR (MISCELLANEOUS) ×2 IMPLANT
SET CYSTO W/LG BORE CLAMP LF (SET/KITS/TRAYS/PACK) ×2 IMPLANT
STENT URET 6FRX24 CONTOUR (STENTS) IMPLANT
STENT URET 6FRX26 CONTOUR (STENTS) IMPLANT
SURGILUBE 2OZ TUBE FLIPTOP (MISCELLANEOUS) ×2 IMPLANT
WATER STERILE IRR 1000ML POUR (IV SOLUTION) ×2 IMPLANT

## 2020-06-20 NOTE — Consult Note (Signed)
Urology Consult  Requesting physician: Dr. Charna Archer  Reason for consultation: Obstructing left ureteral calculi with AKI  Chief Complaint: Flank pain  History of Present Illness: Andrew Stevens is a 60 y.o. male with recurrent stone disease.   Presented to [facility] ED this afternoon with a 2 day history of left flank pain radiating to the left lower quadrant  Pain improved with Tylenol however at ~ 2:00 today he had severe left flank pain radiating to the left lower quadrant without identifiable precipitating, aggravating or alleviating factors  + Nausea without vomiting  No bothersome LUTS  No fever or chills  Stone protocol CT with ~ 12 mm left proximal ureteral calculus with moderate left hydronephrosis and a 7 mm calculus at the UVJ in addition to bilateral renal calculi  Pain improved with parenteral analgesics   Significant history recurrent stone disease  Impacted right proximal ureteral calculus with ureteral stricture in 2019  Subsequently referred to Dr. Alinda Money in [place] for consideration of laparoscopic stricture excision however based on renal function ~ 20% he underwent balloon dilation and ureteroscopic stone removal  Renal scan February 2022 with differential renal function  L83%/R 17% with right kidney atrophic and hydronephrotic  Creatinine in ED today 2.85 with baseline 1.09   Past Medical History:  Diagnosis Date  . Anxiety   . Diabetes mellitus without complication ([place])   . Difficult intubation   . Erectile dysfunction   . GERD (gastroesophageal reflux disease)    occ. takes rolaids or tums if needed  . Headache    hisotry of with brain tumor  . History of brain tumor   . History of kidney stones 2019   Bilateral  . History of umbilical hernia   . Hydronephrosis 2019   Moderate right hydronephrosis  . Hyperlipidemia   . Hypertension   . Lipoma of extremity    left inner thigh, right groin    Past Surgical History:  Procedure  Laterality Date  . BRAIN SURGERY  1989   Cyst removed - benign  . COLONOSCOPY  2015  . CYSTOSCOPY W/ URETERAL STENT PLACEMENT Right 05/27/2017   Procedure: CYSTOSCOPY WITH STENT REPLACEMENT;  Surgeon: [name], MD;  Location: [facility];  Service: Urology;  Laterality: Right;  . CYSTOSCOPY WITH STENT PLACEMENT Right 01/23/2018   Procedure: CYSTOSCOPY WITH STENT PLACEMENT;  Surgeon: Billey Co, MD;  Location: [facility];  Service: Urology;  Laterality: Right;  . CYSTOSCOPY/RETROGRADE/URETEROSCOPY Right 01/23/2018   Procedure: CYSTOSCOPY/RETROGRADE/URETEROSCOPY;  Surgeon: Billey Co, MD;  Location: [facility];  Service: Urology;  Laterality: Right;  . CYSTOSCOPY/URETEROSCOPY/HOLMIUM LASER Right 05/27/2017   Procedure: CYSTOSCOPY/URETEROSCOPY/HOLMIUM LASER;  Surgeon: [name], MD;  Location: [facility];  Service: Urology;  Laterality: Right;  . CYSTOSCOPY/URETEROSCOPY/HOLMIUM LASER/STENT PLACEMENT Right 04/11/2017   Procedure: CYSTOSCOPY/URETEROSCOPY/HOLMIUM LASER/STENT PLACEMENT;  Surgeon: [name], MD;  Location: [facility];  Service: Urology;  Laterality: Right;  . CYSTOSCOPY/URETEROSCOPY/HOLMIUM LASER/STENT PLACEMENT Right 06/18/2018   Procedure: CYSTOSCOPY/ RETROGRADE//URETEROSCOPY/HOLMIUM LASER/STENT PLACEMENT;  Surgeon: Raynelle Bring, MD;  [facility];  Service: Urology;  Laterality: Right;  . CYSTOSCOPY/URETEROSCOPY/HOLMIUM LASER/STENT PLACEMENT Right 11/23/2018   Procedure: CYSTOSCOPY/RETROGRADE PYELOGRAM/URETEROSCOPY/HOLMIUM LASER/STENT PLACEMENT;  Surgeon: Raynelle Bring, MD;  Location: [facility];  Service: Urology;  Laterality: Right;  . INGUINAL HERNIA REPAIR    . MASS EXCISION Bilateral 04/22/2017   Procedure: EXCISION OF 3  MASSES OF PERIRECTAL, INNER THIGH AREAS;  Surgeon: Johnathan Hausen, MD;  Location: [place];  Service: General;  Laterality: Bilateral;  LMA  . ROTATOR CUFF REPAIR Right 2014   Dr. Mack Guise  . VENTRAL HERNIA REPAIR       Home Medications:  Current Meds  Medication Sig  . ondansetron (ZOFRAN ODT) 4 MG disintegrating tablet Take 1 tablet (4 mg total) by mouth every 8 (eight) hours as needed for nausea or vomiting.  Marland Kitchen oxyCODONE-acetaminophen (PERCOCET) 5-325 MG tablet Take 1 tablet by mouth every 4 (four) hours as needed for severe pain.    Allergies: No Known Allergies  Family History  Problem Relation Age of Onset  . Hypertension Mother   . Rheum arthritis Mother   . Alcohol abuse Brother   . Heart attack Sister   . Early death Sister        During childbirth  . Thyroid disease Sister   . Colon cancer Neg Hx   . Prostate cancer Neg Hx     Social History:  reports that he has never smoked. He has never used smokeless tobacco. He reports current alcohol use. He reports that he does not use drugs.  ROS: A complete review of systems was performed.  All systems are negative except for pertinent findings as noted.  Physical Exam:  Vital signs in last 24 hours: Temp:  [98 F (36.7 C)] 98 F (36.7 C) (05/24 1541) Pulse Rate:  [70-87] 70 (05/24 1759) Resp:  [19-21] 19 (05/24 1759) BP: (133-150)/(86-90) 133/87 (05/24 1759) SpO2:  [96 %-99 %] 96 % (05/24 1759) Weight:  [89.8 kg] 89.8 kg (05/24 1541) Constitutional:  Alert and oriented, No acute distress HEENT: [place] AT, moist mucus membranes.  Trachea midline, no masses Cardiovascular: Regular rate and rhythm, no clubbing, cyanosis, or edema. Respiratory: Normal respiratory effort, lungs clear bilaterally GI: Abdomen is soft, nontender, nondistended, no abdominal masses GU: No CVA tenderness Skin: No rashes, bruises or suspicious lesions Lymph: No cervical or inguinal adenopathy Neurologic: Grossly intact, no focal deficits, moving all 4 extremities Psychiatric: Normal mood and affect   Laboratory Data:  Recent Labs    06/20/20 1559  WBC 6.9  HGB 13.9  HCT 39.6   Recent Labs    06/20/20 1559  NA 138  K 4.0  CL 100  CO2 27   GLUCOSE 119*  BUN 39*  CREATININE 2.85*  CALCIUM 9.2    Radiologic Imaging: Images were personally reviewed and interpreted  CT Renal Stone Study  Result Date: 06/20/2020 CLINICAL DATA:  60 year old male with history of nephrolithiasis with left flank pain. EXAM: CT ABDOMEN AND PELVIS WITHOUT CONTRAST TECHNIQUE: Multidetector CT imaging of the abdomen and pelvis was performed following the standard protocol without IV contrast. COMPARISON:  12/12/2017, 04/09/2019 FINDINGS: Lower chest: No acute abnormality. Hepatobiliary: No focal liver abnormality is seen. No gallstones, gallbladder wall thickening, or biliary dilatation. Pancreas: Unremarkable. No pancreatic ductal dilatation or surrounding inflammatory changes. Spleen: Normal in size without focal abnormality. Adrenals/Urinary Tract: The bilateral adrenal glands are normal in size and morphology. Similar appearing diffuse atrophy of the right renal cortex. Previously visualized punctate right upper pole renal calculus is no longer visualized. Multifocal right lower pole renal calculi appear similar, the largest measuring up to 1 cm. No right hydronephrosis. Unchanged 3 mm renal calculus in the proximal right ureter. Interval development of a punctate renal calculus in the superior pole of the right kidney. Decreased burden of previously visualized extensive left inferior pole renal calculus which now measures up to 1 cm in greatest Axial dimension, previously 1.8 cm. there is a proximal  left ureteral calculus measuring up to approximately 1.2 cm with associated moderate left hydronephrosis. The distal left ureter is decompressed. Within the urinary bladder, near the left ureterovesical junction is a 7 mm renal calculus. No renal masses. No significant perinephric fat stranding. Stomach/Bowel: Stomach is within normal limits. Appendix appears normal. No evidence of bowel wall thickening, distention, or inflammatory changes. Vascular/Lymphatic: Aortic  atherosclerosis. No enlarged abdominal or pelvic lymph nodes. Reproductive: The prostate measures up to 5.7 cm in greatest axial dimension. Other: No abdominal wall hernia or abnormality. No abdominopelvic ascites. Musculoskeletal: No acute osseous abnormality. Multilevel degenerative changes of the thoracolumbar spine. IMPRESSION: 1. Obstructive renal calculus in the proximal left ureter measuring up to 1.2 cm, likely dislodged from the prominent, previous visualized left inferior renal calculus which is now smaller. There is also a new 0.7 cm renal calculus in the urinary bladder positioned near the left ureterovesical junction. 2. Otherwise similar appearing scattered bilateral nephrolithiasis, no evidence of right hydronephrosis. 3. Prostatomegaly. Ruthann Cancer, MD [facility] Electronically Signed   By: Ruthann Cancer MD   On: 06/20/2020 16:31    Impression:   Obstructing right proximal ureteral calculus with moderate hydronephrosis  AKI secondary to above with left kidney providing 83% of differential renal function  7 mm calculus at UVJ  Urinalysis without evidence of infection  Recommendation:   Cystoscopy with placement left ureteral stent this evening due to AKI  Will not attempt definitive stone treatment and would recommend a brief period of passive ureteral dilation with stenting due to significant stone burden in the proximal ureter and left lower pole  The procedure was discussed in detail including potential risks of bleeding, infection/sepsis and ureteral injury  The potential of an impacted stone with inability to place stent was discussed and in that event would require placement of percutaneous nephrostomy by interventional radiology  All questions were answered and he desires to proceed    06/20/2020, 6:31 PM  John Giovanni,  MD

## 2020-06-20 NOTE — Op Note (Signed)
Preoperative diagnosis:  1. Obstructing left ureteral calculi with AKI  Postoperative diagnosis:  1. Same  Procedure:  1. Cystoscopy 2. Left ureteral stent placement (66FR/26 cm) 3. Left retrograde pyelography with interpretation   Surgeon: Nicki Reaper C. Trampus Mcquerry, M.D.  Anesthesia: General  Complications: None  Intraoperative findings:  1. Cystoscopy-urethra normal in caliber without stricture; prominent lateral lobe enlargement with hypervascularity; bladder mucosa without erythema, solid or papillary lesions bulging intramural portion of the left ureter due to known distal calculus. 2. Left retrograde pyelogram-moderate left hydronephrosis.  Calculi present left collecting system and distal ureter  EBL: Minimal  Specimens: None  Indication: Andrew Stevens is a 60 y.o. male with a history of recurrent stone disease.  Renal scan February 2022 showed 83% function of the left kidney and 17% function right kidney.  He presented to the ED this afternoon with the left renal colic and CT showed a 12 mm proximal ureteral calculus with moderate hydronephrosis and a 7 mm left distal ureteral calculus.  Creatinine was 2.85 with baseline 1.09.  Stent placement was recommended with definitive stone treatment after a period of stent dilation.  After reviewing the management options for treatment, he elected to proceed with the above surgical procedure(s). We have discussed the potential benefits and risks of the procedure, side effects of the proposed treatment, the likelihood of the patient achieving the goals of the procedure, and any potential problems that might occur during the procedure or recuperation. Informed consent has been obtained.  Description of procedure:  The patient was taken to the operating room and general anesthesia was induced.  The patient was placed in the dorsal lithotomy position, prepped and draped in the usual sterile fashion, and preoperative antibiotics were administered. A  preoperative time-out was performed.   Cystourethroscopy was performed.  A 21 French cystoscope was lubricated, passed per urethra and advanced proximally under direct vision with findings as described above.    A 0.38 Sensor guidewire was then advanced into the left ureteral orifice and easily advanced beyond the distal ureteral calculus.  The guidewire was advanced beyond the proximal calculus with minimal difficulty and the stone was noted to migrate into the renal pelvis under fluoroscopy.   A 5 French open-ended ureteral catheter was placed over the Sensor wire and the Sensor wire was removed followed by retrograde pyelogram with findings as described above.  The Sensor wire was replaced and the ureteral catheter was removed.  A 6FR/26 cm Contour ureteral stent was advanced over the guidewire without difficulty.  The stent was positioned appropriately under fluoroscopic and cystoscopic guidance.  The wire was then removed with an adequate stent curl noted in the renal pelvis as well as in the bladder.  The bladder was then emptied and the procedure ended.  The patient appeared to tolerate the procedure well and without complications.  After anesthetic reversal the patient was transported to the PACU in stable condition.  Plan: 1. Patient will be discharged home this evening if discharge criteria met 2. Follow-up basic metabolic panel Thursday, [id number]/86/5784 3. Definitive stone treatment ~ 2-3 weeks   John Giovanni, MD

## 2020-06-20 NOTE — Transfer of Care (Signed)
Immediate Anesthesia Transfer of Care Note  Patient: Andrew Stevens  Procedure(s) Performed: CYSTOSCOPY WITH STENT PLACEMENT (Left )  Patient Location: PACU  Anesthesia Type:General  Level of Consciousness: awake, alert  and patient cooperative  Airway & Oxygen Therapy: Patient Spontanous Breathing and Patient connected to face mask oxygen  Post-op Assessment: Report given to RN and Post -op Vital signs reviewed and stable  Post vital signs: Reviewed and stable  Last Vitals:  Vitals Value Taken Time  BP 125/81 06/20/20 1941  Temp 36.3 C 06/20/20 1941  Pulse 79 06/20/20 1946  Resp 12 06/20/20 1946  SpO2 100 % 06/20/20 1946  Vitals shown include unvalidated device data.  Last Pain:  Vitals:   06/20/20 1941  TempSrc:   PainSc: 0-No pain         Complications: No complications documented.

## 2020-06-20 NOTE — Anesthesia Procedure Notes (Signed)
Performed by: Lendon Colonel, CRNA

## 2020-06-20 NOTE — Anesthesia Preprocedure Evaluation (Signed)
Anesthesia Evaluation  Patient identified by MRN, date of birth, ID band Patient awake    Reviewed: Allergy & Precautions, NPO status , Patient's Chart, lab work & pertinent test results  History of Anesthesia Complications (+) DIFFICULT AIRWAY and history of anesthetic complications  Airway Mallampati: III  TM Distance: <3 FB Neck ROM: full    Dental  (+) Chipped   Pulmonary neg pulmonary ROS, neg shortness of breath,           Cardiovascular Exercise Tolerance: Good hypertension, (-) angina(-) Past MI and (-) DOE Normal cardiovascular exam     Neuro/Psych  Headaches, PSYCHIATRIC DISORDERS Anxiety    GI/Hepatic negative GI ROS, Neg liver ROS, neg GERD  ,  Endo/Other  diabetes, Type 2  Renal/GU Renal disease     Musculoskeletal   Abdominal   Peds  Hematology negative hematology ROS (+)   Anesthesia Other Findings Past Medical History: No date: Anxiety No date: Diabetes mellitus without complication (HCC) No date: Erectile dysfunction No date: History of kidney stones No date: Hyperlipidemia No date: Hypertension No date: Kidney stones No date: Lipoma of extremity  Past Surgical History: 1989: BRAIN SURGERY     Comment:  Cyst removed - benign No date: INGUINAL HERNIA REPAIR 2013: ROTATOR CUFF REPAIR; Right     Comment:  Dr. Rudene Christians  2014: ROTATOR CUFF REPAIR; Right     Comment:  Dr. Mack Guise No date: [place]     Reproductive/Obstetrics negative OB ROS                             Anesthesia Physical  Anesthesia Plan  ASA: III  Anesthesia Plan: General ETT   Post-op Pain Management:    Induction: Intravenous  PONV Risk Score and Plan: Ondansetron, Dexamethasone, Midazolam and Treatment may vary due to age or medical condition  Airway Management Planned: Oral ETT and Video Laryngoscope Planned  Additional Equipment:   Intra-op Plan:   Post-operative  Plan: Extubation in OR  Informed Consent: I have reviewed the patients History and Physical, chart, labs and discussed the procedure including the risks, benefits and alternatives for the proposed anesthesia with the patient or authorized representative who has indicated his/her understanding and acceptance.     Dental Advisory Given  Plan Discussed with: Anesthesiologist, CRNA and Surgeon  Anesthesia Plan Comments: (Patient consented for risks of anesthesia including but not limited to:  - adverse reactions to medications - damage to eyes, teeth, lips or other oral mucosa - nerve damage due to positioning  - sore throat or hoarseness - Damage to heart, brain, nerves, lungs, other parts of body or loss of life  Patient voiced understanding.)        Anesthesia Quick Evaluation

## 2020-06-20 NOTE — Anesthesia Procedure Notes (Signed)
Procedure Name: Intubation Date/Time: 06/20/2020 7:10 PM Performed by: Lendon Colonel, CRNA Pre-anesthesia Checklist: Patient identified, Patient being monitored, Timeout performed, Emergency Drugs available and Suction available Patient Re-evaluated:Patient Re-evaluated prior to induction Oxygen Delivery Method: Circle system utilized Preoxygenation: Pre-oxygenation with 100% oxygen Induction Type: IV induction and Rapid sequence Laryngoscope Size: Mac and 3 Grade View: Grade I Tube type: Oral Tube size: 7.0 mm Number of attempts: 1 Airway Equipment and Method: Stylet Placement Confirmation: ETT inserted through vocal cords under direct vision,  positive ETCO2 and breath sounds checked- equal and bilateral Secured at: 22 cm Tube secured with: Tape Dental Injury: Teeth and Oropharynx as per pre-operative assessment

## 2020-06-20 NOTE — ED Triage Notes (Signed)
Pt to ED via POV with c/o L lower back pain/flank pain that started Sunday and relieved on it's own, then started again today at approx 1400. Pt states hx of kidney stones at this time.

## 2020-06-20 NOTE — ED Provider Notes (Signed)
[facility] Emergency Department Provider Note   ____________________________________________   Event Date/Time   First MD Initiated Contact with Patient 06/20/20 1542     (approximate)  I have reviewed the triage vital signs and the nursing notes.   HISTORY  Chief Complaint Flank Pain and Back Pain    HPI Andrew Stevens is a 60 y.o. male with past medical history of hypertension, hyperlipidemia, diabetes, and kidney stones who presents to the ED complaining of flank pain.  Patient reports that 2 days ago he had onset of pain in his left flank radiating towards the left side of his abdomen.  He took some Tylenol and the pain seemed to ease off before coming back around 2:00 this afternoon.  Pain has been severe since it recurred today, is sharp and again radiates towards the left side of his abdomen.  He has felt nauseous but has not vomited, denies any dysuria or hematuria.  He reports current pain is similar to prior kidney stones.  He denies any fevers, cough, chest pain, shortness of breath, or changes in his bowel movements.        Past Medical History:  Diagnosis Date  . Anxiety   . Diabetes mellitus without complication ([place])   . Difficult intubation   . Erectile dysfunction   . GERD (gastroesophageal reflux disease)    occ. takes rolaids or tums if needed  . Headache    hisotry of with brain tumor  . History of brain tumor   . History of kidney stones 2019   Bilateral  . History of umbilical hernia   . Hydronephrosis 2019   Moderate right hydronephrosis  . Hyperlipidemia   . Hypertension   . Lipoma of extremity    left inner thigh, right groin    Patient Active Problem List   Diagnosis Date Noted  . Hypertension associated with diabetes ([place]) 01/14/2020  . Ureteral stricture 02/20/2018  . Annual physical exam 01/01/2018  . Anxiety 01/01/2018  . Nephrolithiasis 03/17/2017  . Personal history of kidney stones 02/24/2017  .  Hematuria 02/03/2017  . Mass of testicle 02/03/2017  . Pilonidal cyst 02/03/2017  . Lipoma of left lower extremity 02/03/2017  . Benign prostatic hyperplasia without lower urinary tract symptoms 01/10/2017  . Vitamin D deficiency 01/10/2017  . Acute bronchitis 01/17/2016  . Groin pain 01/11/2014  . Diabetes type 2, controlled ([place]) 09/23/2013  . Hyperlipidemia 09/23/2013  . Routine general medical examination at a health care facility 05/20/2013  . Screening for colon cancer 05/20/2013  . Erectile dysfunction 06/10/2011  . Hypertension 01/04/2011    Past Surgical History:  Procedure Laterality Date  . BRAIN SURGERY  1989   Cyst removed - benign  . COLONOSCOPY  2015  . CYSTOSCOPY W/ URETERAL STENT PLACEMENT Right 05/27/2017   Procedure: CYSTOSCOPY WITH STENT REPLACEMENT;  Surgeon: [name], MD;  Location: [facility];  Service: Urology;  Laterality: Right;  . CYSTOSCOPY WITH STENT PLACEMENT Right 01/23/2018   Procedure: CYSTOSCOPY WITH STENT PLACEMENT;  Surgeon: Billey Co, MD;  Location: [facility];  Service: Urology;  Laterality: Right;  . CYSTOSCOPY/RETROGRADE/URETEROSCOPY Right 01/23/2018   Procedure: CYSTOSCOPY/RETROGRADE/URETEROSCOPY;  Surgeon: Billey Co, MD;  Location: [facility];  Service: Urology;  Laterality: Right;  . CYSTOSCOPY/URETEROSCOPY/HOLMIUM LASER Right 05/27/2017   Procedure: CYSTOSCOPY/URETEROSCOPY/HOLMIUM LASER;  Surgeon: [name], MD;  Location: [facility];  Service: Urology;  Laterality: Right;  . CYSTOSCOPY/URETEROSCOPY/HOLMIUM LASER/STENT PLACEMENT Right 04/11/2017   Procedure: CYSTOSCOPY/URETEROSCOPY/HOLMIUM LASER/STENT  PLACEMENT;  Surgeon: [name], MD;  Location: [facility];  Service: Urology;  Laterality: Right;  . CYSTOSCOPY/URETEROSCOPY/HOLMIUM LASER/STENT PLACEMENT Right 06/18/2018   Procedure: CYSTOSCOPY/ RETROGRADE//URETEROSCOPY/HOLMIUM LASER/STENT PLACEMENT;  Surgeon: Raynelle Bring, MD;  Location: [facility];  Service: Urology;   Laterality: Right;  . CYSTOSCOPY/URETEROSCOPY/HOLMIUM LASER/STENT PLACEMENT Right 11/23/2018   Procedure: CYSTOSCOPY/RETROGRADE PYELOGRAM/URETEROSCOPY/HOLMIUM LASER/STENT PLACEMENT;  Surgeon: Raynelle Bring, MD;  Location: [facility];  Service: Urology;  Laterality: Right;  . INGUINAL HERNIA REPAIR    . MASS EXCISION Bilateral 04/22/2017   Procedure: EXCISION OF 3  MASSES OF PERIRECTAL, INNER THIGH AREAS;  Surgeon: Johnathan Hausen, MD;  Location: [place];  Service: General;  Laterality: Bilateral;  LMA  . ROTATOR CUFF REPAIR Right 2014   Dr. Mack Guise  . VENTRAL HERNIA REPAIR      Prior to Admission medications   Medication Sig Start Date End Date Taking? Authorizing Provider  ondansetron (ZOFRAN ODT) 4 MG disintegrating tablet Take 1 tablet (4 mg total) by mouth every 8 (eight) hours as needed for nausea or vomiting. 06/20/20  Yes Blake Divine, MD  oxyCODONE-acetaminophen (PERCOCET) 5-325 MG tablet Take 1 tablet by mouth every 4 (four) hours as needed for severe pain. 06/20/20 06/20/21 Yes Blake Divine, MD  amLODipine (NORVASC) 10 MG tablet Take 1 tablet (10 mg total) by mouth every morning. 01/14/20   McLean-Scocuzza, Nino Glow, MD  amLODipine (NORVASC) 10 MG tablet TAKE 1 TABLET (10 MG TOTAL) BY MOUTH EVERY MORNING. 01/10/20 01/09/21  McLean-Scocuzza, Nino Glow, MD  atorvastatin (LIPITOR) 20 MG tablet Take 1 tablet (20 mg total) by mouth at bedtime. 01/14/20   McLean-Scocuzza, Nino Glow, MD  atorvastatin (LIPITOR) 20 MG tablet TAKE 1 TABLET (20 MG TOTAL) BY MOUTH AT BEDTIME. 01/10/20 01/09/21  McLean-Scocuzza, Nino Glow, MD  chlorthalidone (HYGROTON) 25 MG tablet Take 1 tablet (25 mg total) by mouth daily. 01/14/20   McLean-Scocuzza, Nino Glow, MD  chlorthalidone (HYGROTON) 25 MG tablet TAKE 1 TABLET (25 MG TOTAL) BY MOUTH DAILY. 01/10/20 01/09/21  McLean-Scocuzza, Nino Glow, MD  COVID-19 mRNA vaccine, Pfizer, 30 MCG/0.3ML injection USE AS DIRECTED 12/31/19 12/30/20  Carlyle Basques, MD   metFORMIN (GLUCOPHAGE) 500 MG tablet Take 1 tablet (500 mg total) by mouth 2 (two) times daily with a meal. 01/14/20   McLean-Scocuzza, Nino Glow, MD  metFORMIN (GLUCOPHAGE) 500 MG tablet TAKE 1 TABLET BY MOUTH 2 (TWO) TIMES DAILY WITH A MEAL. 01/10/20 01/09/21  McLean-Scocuzza, Nino Glow, MD  potassium citrate (UROCIT-K) 10 MEQ (1080 MG) SR tablet Take 2 tablets (20 mEq total) by mouth 2 (two) times daily. 01/10/20   McLean-Scocuzza, Nino Glow, MD  potassium citrate (UROCIT-K) 10 MEQ (1080 MG) SR tablet TAKE TWO TABLETS BY MOUTH TWICE DAILY 12/31/19 12/30/20  Raynelle Bring, MD  sildenafil (VIAGRA) 100 MG tablet Take 100 mg by mouth as directed. 12/23/19   [provider]  sildenafil (VIAGRA) 100 MG tablet TAKE 1 TABLET BY MOUTH BY MOUTH AS NEEDED AS DIRECTED 10/29/19 10/28/20  Raynelle Bring, MD  telmisartan (MICARDIS) 80 MG tablet Take 1 tablet (80 mg total) by mouth daily. 01/14/20   McLean-Scocuzza, Nino Glow, MD  telmisartan (MICARDIS) 80 MG tablet TAKE 1 TABLET (80 MG TOTAL) BY MOUTH DAILY. 01/10/20 01/09/21  McLean-Scocuzza, Nino Glow, MD  telmisartan (MICARDIS) 80 MG tablet TAKE 1 TABLET BY MOUTH DAILY. 12/21/19 12/20/20  McLean-Scocuzza, Nino Glow, MD    Allergies Patient has no known allergies.  Family History  Problem Relation Age of Onset  . Hypertension  Mother   . Rheum arthritis Mother   . Alcohol abuse Brother   . Heart attack Sister   . Early death Sister        During childbirth  . Thyroid disease Sister   . Colon cancer Neg Hx   . Prostate cancer Neg Hx     Social History Social History   Tobacco Use  . Smoking status: Never Smoker  . Smokeless tobacco: Never Used  Vaping Use  . Vaping Use: Never used  Substance Use Topics  . Alcohol use: Yes    Alcohol/week: 0.0 standard drinks    Comment: Occasional  . Drug use: No    Review of Systems  Constitutional: No fever/chills Eyes: No visual changes. ENT: No sore throat. Cardiovascular: Denies chest  pain. Respiratory: Denies shortness of breath. Gastrointestinal: Positive for left flank and abdominal pain.  Positive for nausea, no vomiting.  No diarrhea.  No constipation. Genitourinary: Negative for dysuria. Musculoskeletal: Negative for back pain. Skin: Negative for rash. Neurological: Negative for headaches, focal weakness or numbness.  ____________________________________________   PHYSICAL EXAM:  VITAL SIGNS: ED Triage Vitals  Enc Vitals Group     BP 06/20/20 1541 (!) 145/86     Pulse Rate 06/20/20 1541 87     Resp 06/20/20 1541 20     Temp 06/20/20 1541 98 F (36.7 C)     Temp Source 06/20/20 1541 Oral     SpO2 06/20/20 1541 99 %     Weight 06/20/20 1541 198 lb (89.8 kg)     Height 06/20/20 1541 5\' 10"  (1.778 m)     Head Circumference --      Peak Flow --      Pain Score 06/20/20 1540 8     Pain Loc --      Pain Edu? --      Excl. in [place]? --     Constitutional: Alert and oriented. Eyes: Conjunctivae are normal. Head: Atraumatic. Nose: No congestion/rhinnorhea. Mouth/Throat: Mucous membranes are moist. Neck: Normal ROM Cardiovascular: Normal rate, regular rhythm. Grossly normal heart sounds. Respiratory: Normal respiratory effort.  No retractions. Lungs CTAB. Gastrointestinal: Soft and tender to palpation in the left upper quadrant and left lower quadrant.  Left CVA tenderness noted. No distention. Genitourinary: deferred Musculoskeletal: No lower extremity tenderness nor edema. Neurologic:  Normal speech and language. No gross focal neurologic deficits are appreciated. Skin:  Skin is warm, dry and intact. No rash noted. Psychiatric: Mood and affect are normal. Speech and behavior are normal.  ____________________________________________   LABS (all labs ordered are listed, but only abnormal results are displayed)  Labs Reviewed  COMPREHENSIVE METABOLIC PANEL - Abnormal; Notable for the following components:      Result Value   Glucose, Bld 119 (*)     BUN 39 (*)    Creatinine, Ser 2.85 (*)    GFR, Estimated 24 (*)    All other components within normal limits  URINALYSIS, COMPLETE (UACMP) WITH MICROSCOPIC - Abnormal; Notable for the following components:   Color, Urine YELLOW (*)    APPearance CLEAR (*)    Hgb urine dipstick MODERATE (*)    Leukocytes,Ua TRACE (*)    Bacteria, UA RARE (*)    All other components within normal limits  CBC WITH DIFFERENTIAL/PLATELET  LIPASE, BLOOD     PROCEDURES  Procedure(s) performed (including Critical Care):  Procedures   ____________________________________________   INITIAL IMPRESSION / ASSESSMENT AND PLAN / ED COURSE  61 year old male with past medical history of hypertension, hyperlipidemia, diabetes, and kidney stones who presents to the ED with acute onset severe pain in his left flank radiating towards the left side of his abdomen.  Pain appears consistent with a kidney stone and we will further assess with CT scan.  We will check labs and UA, treat symptomatically with IV morphine and Zofran.  CT scan reviewed by me and shows large proximal left ureteral kidney stone.  Stone sized at 1.2 cm per radiology with additional 7 mm stone near the left UVJ.  No signs of infection noted on UA although patient noted to have an AKI.  Previous imaging has demonstrated that 83% of his kidney function comes from the left with only 17% coming from the right.  Urinary obstruction is likely contributing to his AKI and due to this as well as large stone, case discussed with Dr. [name] of urology.  He will plan to take the patient to the OR later this evening for stent placement, after which the patient may be discharged home.  Patient agrees with plan.      ____________________________________________   FINAL CLINICAL IMPRESSION(S) / ED DIAGNOSES  Final diagnoses:  Ureterolithiasis  AKI (acute kidney injury) [facility])     ED Discharge Orders         Ordered    oxyCODONE-acetaminophen  (PERCOCET) 5-325 MG tablet  Every 4 hours PRN        06/20/20 1704    ondansetron (ZOFRAN ODT) 4 MG disintegrating tablet  Every 8 hours PRN        06/20/20 1704           Note:  This document was prepared using Dragon voice recognition software and may include unintentional dictation errors.   Blake Divine, MD 06/20/20 [phone]

## 2020-06-20 NOTE — Discharge Instructions (Addendum)
AMBULATORY SURGERY  DISCHARGE INSTRUCTIONS   1) The drugs that you were given will stay in your system until tomorrow so for the next 24 hours you should not:  A) Drive an automobile B) Make any legal decisions C) Drink any alcoholic beverage   2) You may resume regular meals tomorrow.  Today it is better to start with liquids and gradually work up to solid foods.  You may eat anything you prefer, but it is better to start with liquids, then soup and crackers, and gradually work up to solid foods.   3) Please notify your doctor immediately if you have any unusual bleeding, trouble breathing, redness and pain at the surgery site, drainage, fever, or pain not relieved by medication.    4) Additional Instructions:    Follow Dr. Dene Gentry instructions.    Please contact your physician with any problems or Same Day Surgery at [phone], Monday through Friday 6 am to 4 pm, or [place] at [facility] number at [phone].DISCHARGE INSTRUCTIONS FOR KIDNEY STONE/URETERAL STENT   MEDICATIONS:  1. Resume all your other meds from home.  2.  AZO (over-the-counter) can help with the burning/stinging when you urinate. 3.  Oxycodone is for moderate/severe pain, Dr. Charna Archer sent Rx to [facility].  Oxybutynin and tamsulosin are for stent irritation, Rxs were sent to [facility].  Ondansetron is for nausea, Dr. Charna Archer sent Rx to [facility]:  1. May resume regular activities in 24 hours. 2. No driving while on narcotic pain medications  3. Drink plenty of water  4. Continue to walk at home - you can still get blood clots when you are at home, so keep active, but don't over do it.  5. May return to work/school tomorrow or when you feel ready    SIGNS/SYMPTOMS TO CALL:  Common postoperative symptoms include urinary frequency, urgency, bladder spasm and blood in the urine  Please call us if you have a fever greater than 101.5, uncontrolled nausea/vomiting,  uncontrolled pain, dizziness, unable to urinate, excessively bloody urine, chest pain, shortness of breath, leg swelling, leg pain, or any other concerns or questions.   You can reach [place] at [phone].   FOLLOW-UP:  1.  Our office will contact you for blood work to repeat your kidney function panel on Thursday, 06/22/2020 and for follow-up office visit

## 2020-06-21 ENCOUNTER — Encounter: Payer: Self-pay | Admitting: Urology

## 2020-06-22 NOTE — Anesthesia Postprocedure Evaluation (Signed)
Anesthesia Post Note  Patient: Andrew Stevens  Procedure(s) Performed: CYSTOSCOPY WITH STENT PLACEMENT (Left )  Patient location during evaluation: PACU Anesthesia Type: General Level of consciousness: awake and alert Pain management: pain level controlled Vital Signs Assessment: post-procedure vital signs reviewed and stable Respiratory status: spontaneous breathing, nonlabored ventilation, respiratory function stable and patient connected to nasal cannula oxygen Cardiovascular status: blood pressure returned to baseline and stable Postop Assessment: no apparent nausea or vomiting Anesthetic complications: no   No complications documented.   Last Vitals:  Vitals:   06/20/20 2035 06/20/20 2040  BP:  121/83  Pulse: 72 76  Resp: 11 12  Temp:  (!) 36.2 C  SpO2: 96% 100%    Last Pain:  Vitals:   06/20/20 2040  TempSrc:   PainSc: 0-No pain                 Precious Haws Larissa Pegg

## 2020-06-24 ENCOUNTER — Other Ambulatory Visit: Payer: Self-pay

## 2020-06-24 MED FILL — Potassium Citrate Tab ER 10 MEQ (1080 MG): ORAL | 30 days supply | Qty: 120 | Fill #1 | Status: AC

## 2020-06-24 MED FILL — Atorvastatin Calcium Tab 20 MG (Base Equivalent): ORAL | 90 days supply | Qty: 90 | Fill #0 | Status: AC

## 2020-06-24 MED FILL — Telmisartan Tab 80 MG: ORAL | 90 days supply | Qty: 90 | Fill #0 | Status: AC

## 2020-06-27 ENCOUNTER — Other Ambulatory Visit: Payer: Self-pay

## 2020-07-06 ENCOUNTER — Telehealth: Payer: Self-pay | Admitting: Urology

## 2020-07-06 NOTE — Telephone Encounter (Signed)
Please check with pt to see if he wants to have his stone treated here or in [place]. He may have already [facility] contact with [facility]

## 2020-07-13 IMAGING — CT CT RENAL STONE PROTOCOL
2 of 4 series · 16 of 46 positions shown, 18 images · non-contrast
Comparison: 09/11/2009

CLINICAL DATA: Left flank pain for 2-3 months.

EXAM:
CT ABDOMEN AND PELVIS WITHOUT CONTRAST
TECHNIQUE: Multidetector CT imaging of the abdomen and pelvis was performed
following the standard protocol without IV contrast.

[Series 2: stone full standard · axial · 0.76mm/px · z∈[-878,-453]mm · 13 of 93 slices shown, 15 images]
[im 4/93  soft-tissue]
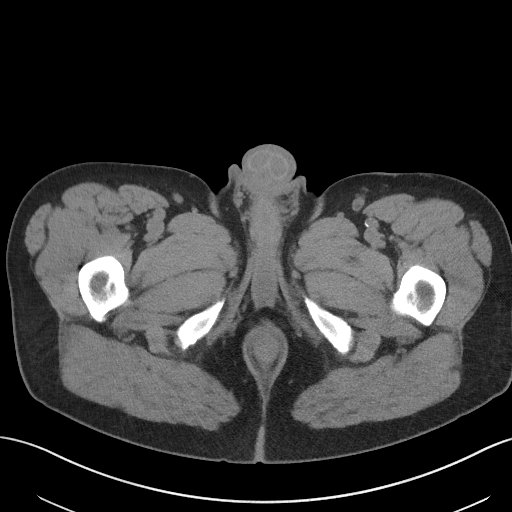
[im 4/93  bone]
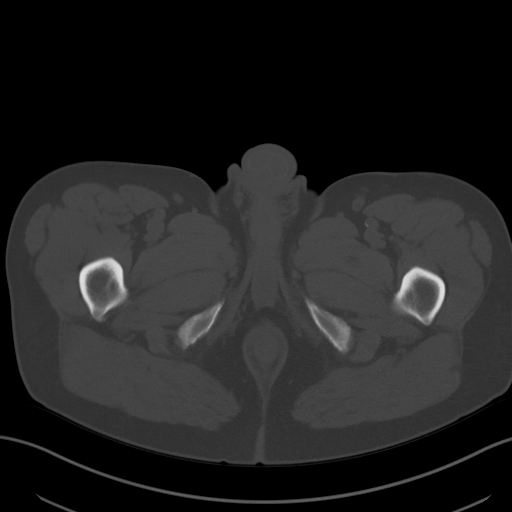
[im 12/93  soft-tissue]
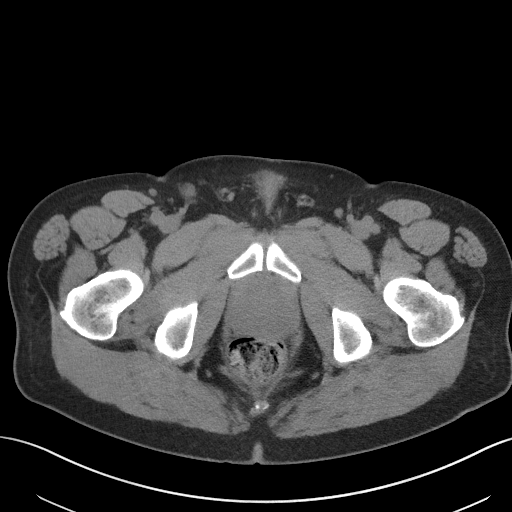
[im 19/93  soft-tissue]
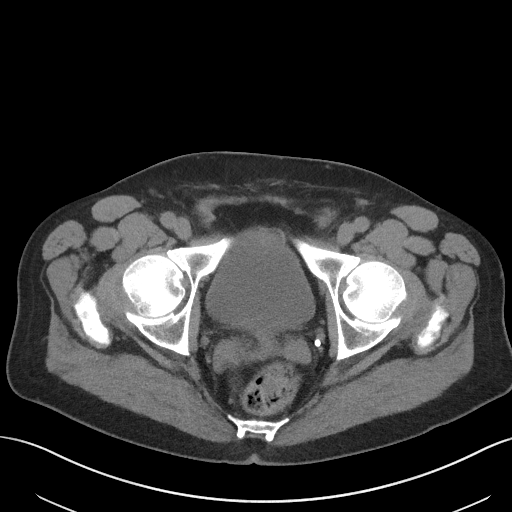
[im 26/93  soft-tissue]
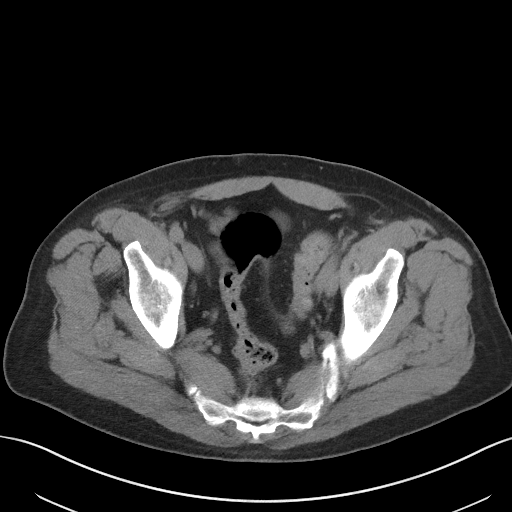
[im 34/93  soft-tissue]
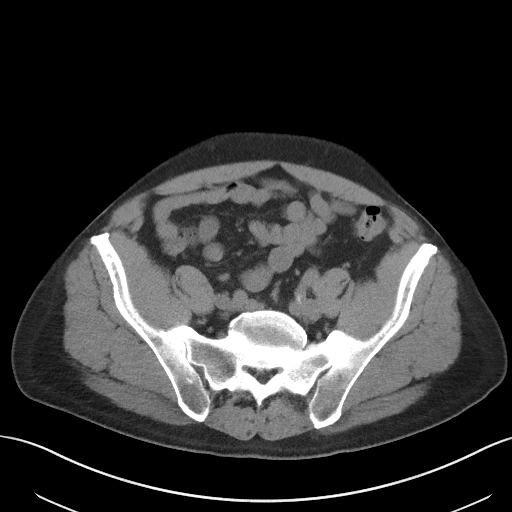
[im 41/93  soft-tissue]
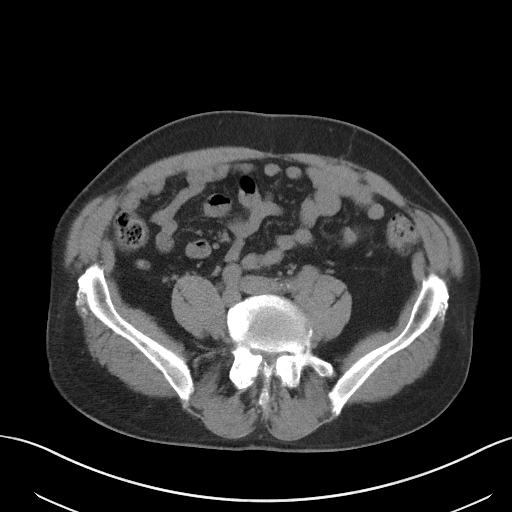
[im 48/93  soft-tissue]
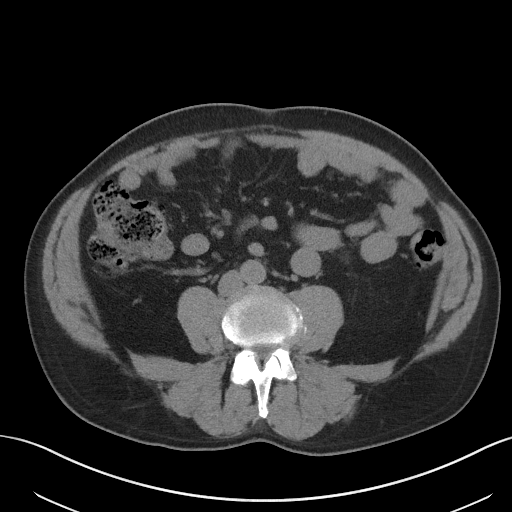
[im 52/93  soft-tissue]
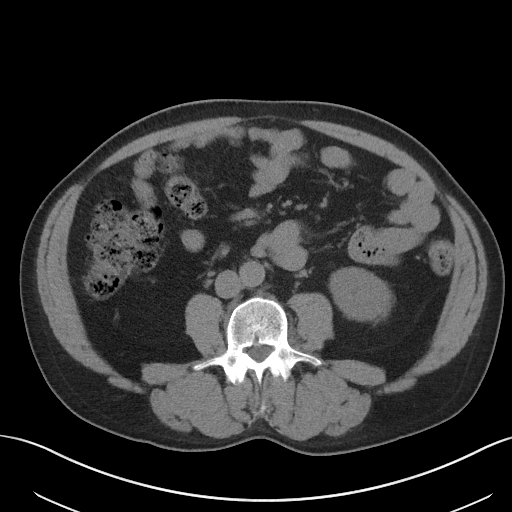
[im 59/93  soft-tissue]
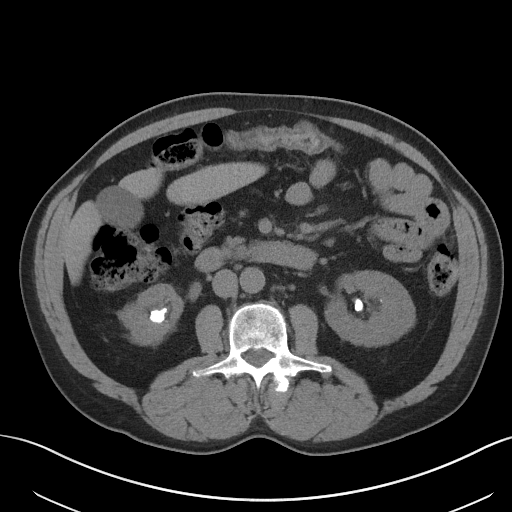
[im 59/93  bone]
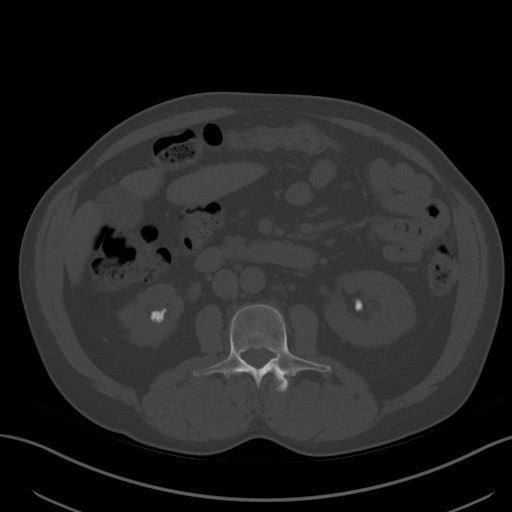
[im 67/93  soft-tissue]
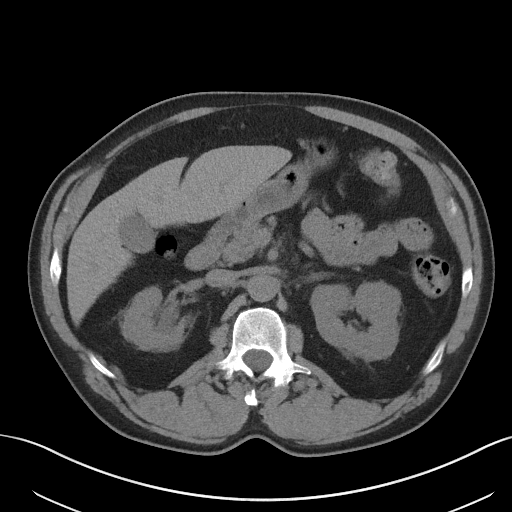
[im 74/93  soft-tissue]
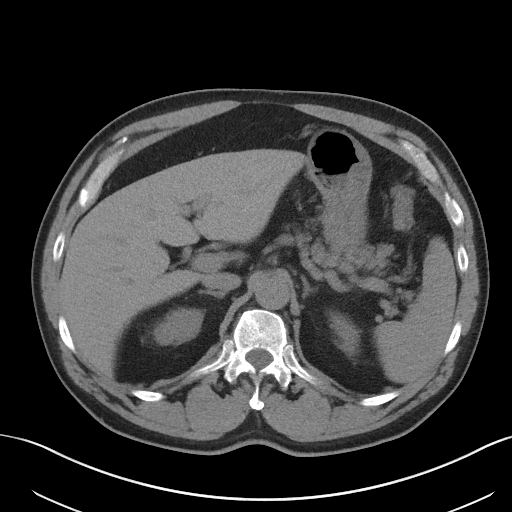
[im 81/93  soft-tissue]
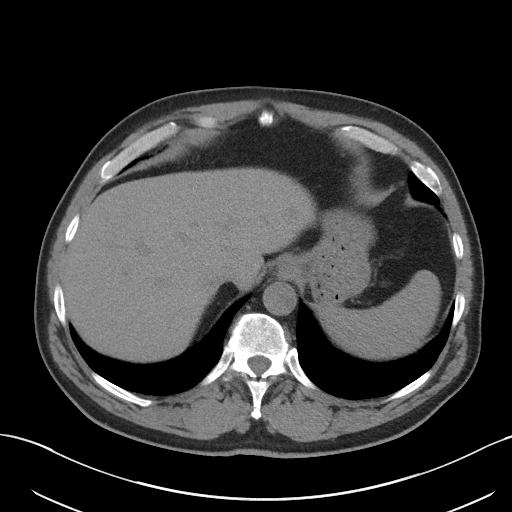
[im 89/93  soft-tissue]
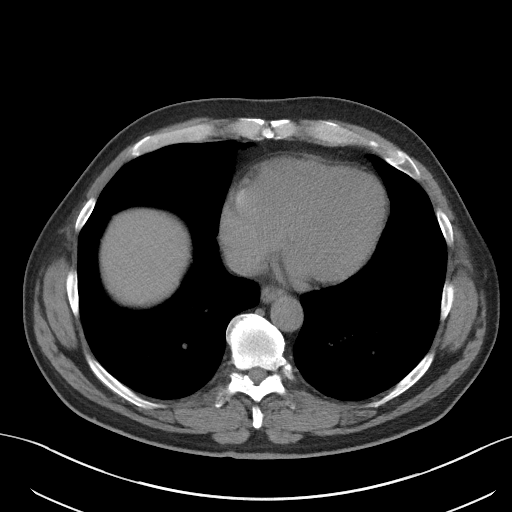

[Series 5: coronal · coronal · 0.77mm/px · 3 of 146 slices shown]
[im 49/146  soft-tissue]
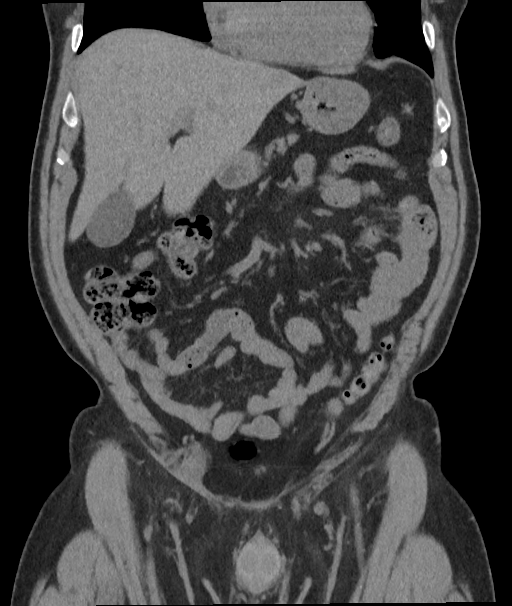
[im 65/146  soft-tissue]
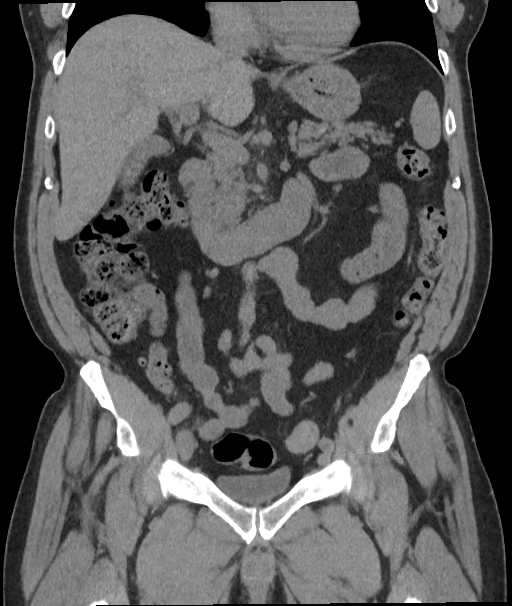
[im 81/146  soft-tissue]
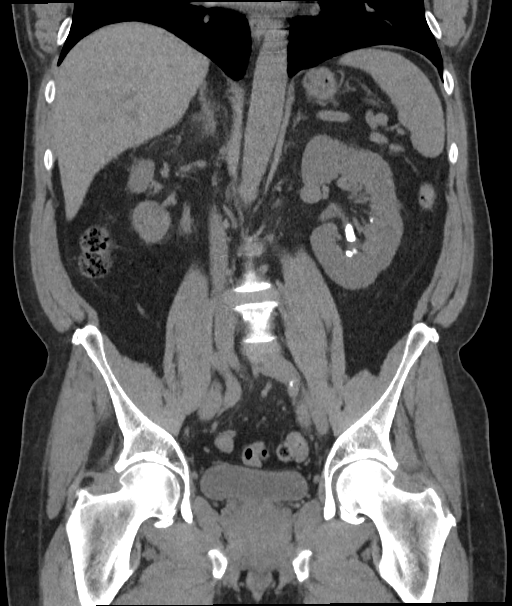

[16 of 46 positions shown; findings below may reference images not displayed]

FINDINGS: Lower chest: The lung bases are clear of acute process. No pleural
effusion or pulmonary lesions. The heart is normal in size. No
pericardial effusion. The distal esophagus and aorta are
unremarkable.

Hepatobiliary: No focal hepatic lesions or intrahepatic biliary
dilatation. The gallbladder is normal. No common bile duct
dilatation.

Pancreas: No mass, inflammation or ductal dilatation.

Spleen: Normal size.  No focal lesions.

Adrenals/Urinary Tract: The adrenal glands are normal.

Right kidney: Significant scarring changes, atrophy and mild to
moderate hydronephrosis. Cluster of lower pole calculi are noted
with the largest calculus measuring up to 12 mm. There is also a 3
mm calculus in the proximal right ureter just below the UPJ and at
the level of the L3 vertebral body.

Left kidney: Normal renal cortical thickness without scarring
changes. Numerous renal calculi are demonstrated with the largest
calculus in the lower pole region measuring 13 mm. No hydronephrosis
or left-sided ureteral calculi. No worrisome renal lesions.

No bladder mass or calculi.  No asymmetric bladder wall thickening.

Stomach/Bowel: The stomach, duodenum, small bowel and colon are
grossly normal without oral contrast. No inflammatory changes, mass
lesions or obstructive findings. The terminal ileum and appendix are
normal.

Vascular/Lymphatic: Scattered atherosclerotic calcifications
involving the distal aorta and iliac arteries. No aneurysm. No
mesenteric or retroperitoneal mass or adenopathy. Small scattered
lymph nodes are noted.

Reproductive: Mild prostate gland enlargement. The seminal vesicles
appear normal.

Other: No inguinal mass, adenopathy or hernia.

Musculoskeletal: No significant bony findings.
IMPRESSION: 1. Bilateral renal calculi.
2. 3 mm upper right ureteral calculus with mild to moderate grade
hydroureteronephrosis. No left-sided ureteral calculi.
3. Small scarred right kidney.
4. Normal bladder without contrast.
5. No acute abdominal/pelvic findings, mass lesions or adenopathy.

## 2020-07-17 ENCOUNTER — Other Ambulatory Visit: Payer: Self-pay | Admitting: Urology

## 2020-07-18 MED FILL — Sildenafil Citrate Tab 100 MG: ORAL | 30 days supply | Qty: 6 | Fill #2 | Status: AC

## 2020-07-19 ENCOUNTER — Other Ambulatory Visit: Payer: Self-pay

## 2020-08-03 NOTE — Progress Notes (Addendum)
COVID Vaccine Completed: Yes x3 Date COVID Vaccine completed: 01-25-19 02-15-19 Has received booster: 12-31-19 COVID vaccine manufacturer: Pfizer     Date of COVID positive in last 90 days:  N/A  PCP - Orland Mustard, MD Cardiologist - Ida Rogue, MD (last OV 2013)  Chest x-ray - N/A EKG - 08-04-20 Epic Stress Test - 2013 Epic ECHO - N/A Cardiac Cath - N/A Pacemaker/ICD device last checked: Spinal Cord Stimulator:  Sleep Study - N/A CPAP -   Fasting Blood Sugar - No, 129 at PAT Checks Blood Sugar _____ times a day  Blood Thinner Instructions: Aspirin Instructions: Last Dose:  Activity level:  Can go up a flight of stairs and perform activities of daily living without stopping and without symptoms of chest pain or shortness of breath.  Able to exercise without symptoms   Anesthesia review:  Hx of chest pain evaluated by cardiology in 2013.   Patient stated felt like heart was skipping beats, has no had chest pain or this sensation in years.    BP elevated at PAT 168/90, patient states has white coat HTN.  158/92 on recheck.  Will monitor at home and call PCP if remains elevated.    Patient denies shortness of breath, fever, cough and chest pain at PAT appointment   Patient verbalized understanding of instructions that were given to them at the PAT appointment. Patient was also instructed that they will need to review over the PAT instructions again at home before surgery.

## 2020-08-03 NOTE — Patient Instructions (Addendum)
DUE TO COVID-19 ONLY ONE VISITOR IS ALLOWED TO COME WITH YOU AND STAY IN THE WAITING ROOM ONLY DURING PRE OP AND PROCEDURE.   **NO VISITORS ARE ALLOWED IN THE SHORT STAY AREA OR RECOVERY ROOM!!**        Your procedure is scheduled on: Thursday, 08-10-20   Report to [facility] Main  Entrance   Report to admitting at 2:30 PM   Call this number if you have problems the morning of surgery [phone]   Do not eat food :After Midnight.   May have liquids until 1:30 PM day of surgery  CLEAR LIQUID DIET  Foods Allowed                                                                     Foods Excluded  Water, Black Coffee and tea, regular and decaf                liquids that you cannot  Plain Jell-O in any flavor  (No red)                                      see through such as: Fruit ices (not with fruit pulp)                                      milk, soups, orange juice              Iced Popsicles (No red)                                      All solid food                                   Apple juices Sports drinks like Gatorade (No red) Lightly seasoned clear broth or consume(fat free) Sugar, honey syrup      Oral Hygiene is also important to reduce your risk of infection.                                    Remember - BRUSH YOUR TEETH THE MORNING OF SURGERY WITH YOUR REGULAR TOOTHPASTE   Do NOT smoke after Midnight   Take these medicines the morning of surgery with A SIP OF WATER:  Amlodipine, Tamsulosin    WHAT DO I DO ABOUT MY DIABETES MEDICATION?  Do not take oral diabetes medicines (pills) the morning of surgery.  THE DAY BEFORE SURGERY:  Take Metformin as prescribed.       THE MORNING OF SURGERY:  Do not take Metformin.   Reviewed and Endorsed by [facility] Patient Education Committee, August 2015                               You may not have any metal on  your body including jewelry, and body piercing             Do not wear lotions, powders,  cologne, or deodorant              Men may shave face and neck.   Do not bring valuables to the hospital. [place].   Contacts, dentures or bridgework may not be worn into surgery.   Patients discharged the day of surgery will not be allowed to drive home.    Please read over the following fact sheets you were given: IF YOU HAVE QUESTIONS ABOUT YOUR PRE OP INSTRUCTIONS PLEASE CALL [facility] - Preparing for Surgery Before surgery, you can play an important role.  Because skin is not sterile, your skin needs to be as free of germs as possible.  You can reduce the number of germs on your skin by washing with CHG (chlorahexidine gluconate) soap before surgery.  CHG is an antiseptic cleaner which kills germs and bonds with the skin to continue killing germs even after washing. Please DO NOT use if you have an allergy to CHG or antibacterial soaps.  If your skin becomes reddened/irritated stop using the CHG and inform your nurse when you arrive at Short Stay. Do not shave (including legs and underarms) for at least 48 hours prior to the first CHG shower.  You may shave your face/neck.  Please follow these instructions carefully:  1.  Shower with CHG Soap the night before surgery and the  morning of surgery.  2.  If you choose to wash your hair, wash your hair first as usual with your normal  shampoo.  3.  After you shampoo, rinse your hair and body thoroughly to remove the shampoo.                             4.  Use CHG as you would any other liquid soap.  You can apply chg directly to the skin and wash.  Gently with a scrungie or clean washcloth.  5.  Apply the CHG Soap to your body ONLY FROM THE NECK DOWN.   Do   not use on face/ open                           Wound or open sores. Avoid contact with eyes, ears mouth and   genitals (private parts).                       Wash face,  Genitals (private parts) with your normal soap.              6.  Wash thoroughly, paying special attention to the area where your    surgery  will be performed.  7.  Thoroughly rinse your body with warm water from the neck down.  8.  DO NOT shower/wash with your normal soap after using and rinsing off the CHG Soap.                9.  Pat yourself dry with a clean towel.            10.  Wear clean pajamas.            11.  Place clean sheets on your bed the night of your first shower and do  not  sleep with pets. Day of Surgery : Do not apply any lotions/deodorants the morning of surgery.  Please wear clean clothes to the hospital/surgery center.  FAILURE TO FOLLOW THESE INSTRUCTIONS MAY RESULT IN THE CANCELLATION OF YOUR SURGERY  PATIENT SIGNATURE_________________________________  NURSE SIGNATURE__________________________________  ________________________________________________________________________

## 2020-08-04 ENCOUNTER — Encounter (HOSPITAL_COMMUNITY): Payer: Self-pay

## 2020-08-04 ENCOUNTER — Encounter (HOSPITAL_COMMUNITY)
Admission: RE | Admit: 2020-08-04 | Discharge: 2020-08-04 | Disposition: A | Discharge status: Home / Self Care | Payer: No Typology Code available for payment source | Source: Ambulatory Visit | Attending: Urology | Admitting: Urology

## 2020-08-04 ENCOUNTER — Other Ambulatory Visit: Payer: Self-pay

## 2020-08-04 DIAGNOSIS — Z01818 Encounter for other preprocedural examination: Secondary | ICD-10-CM | POA: Insufficient documentation

## 2020-08-04 LAB — CBC
HCT: 47 % (ref 39.0–52.0)
Hemoglobin: 15.8 g/dL (ref 13.0–17.0)
MCH: 29.8 pg (ref 26.0–34.0)
MCHC: 33.6 g/dL (ref 30.0–36.0)
MCV: 88.7 fL (ref 80.0–100.0)
Platelets: 385 10*3/uL (ref 150–400)
RBC: 5.3 MIL/uL (ref 4.22–5.81)
RDW: 12.8 % (ref 11.5–15.5)
WBC: 8.6 10*3/uL (ref 4.0–10.5)
nRBC: 0 % (ref 0.0–0.2)

## 2020-08-04 LAB — BASIC METABOLIC PANEL
Anion gap: 8 (ref 5–15)
BUN: 18 mg/dL (ref 8–23)
CO2: 28 mmol/L (ref 22–32)
Calcium: 9.9 mg/dL (ref 8.9–10.3)
Chloride: 101 mmol/L (ref 98–111)
Creatinine, Ser: 1.07 mg/dL (ref 0.61–1.24)
GFR, Estimated: >60 mL/min (ref >60)
Glucose, Bld: 116 mg/dL — ABNORMAL HIGH (ref 70–99)
Potassium: 4.4 mmol/L (ref 3.5–5.1)
Sodium: 137 mmol/L (ref 135–145)

## 2020-08-04 LAB — HEMOGLOBIN A1C
Hgb A1c MFr Bld: 6.5 % — ABNORMAL HIGH (ref 4.8–5.6)
Mean Plasma Glucose: 139.85 mg/dL

## 2020-08-04 LAB — GLUCOSE, CAPILLARY: Glucose-Capillary: 129 mg/dL — ABNORMAL HIGH (ref 70–99)

## 2020-08-09 ENCOUNTER — Encounter (HOSPITAL_COMMUNITY): Payer: Self-pay | Admitting: Urology

## 2020-08-09 NOTE — Anesthesia Preprocedure Evaluation (Addendum)
Anesthesia Evaluation  Patient identified by MRN, date of birth, ID band Patient awake    Reviewed: Allergy & Precautions, NPO status , Patient's Chart, lab work & pertinent test results  History of Anesthesia Complications (+) DIFFICULT AIRWAY and history of anesthetic complications (successful w/ video laryngoscopy multiple times in past)  Airway Mallampati: II       Dental  (+) Chipped, Dental Advisory Given,    Pulmonary neg pulmonary ROS,    breath sounds clear to auscultation       Cardiovascular hypertension, Pt. on medications  Rhythm:Regular Rate:Normal     Neuro/Psych  Headaches, PSYCHIATRIC DISORDERS Anxiety    GI/Hepatic Neg liver ROS, GERD  Medicated and Controlled,  Endo/Other  diabetes, Well Controlled, Type 2, Oral Hypoglycemic Agents  Renal/GU Renal diseaseLab Results      Component                Value               Date                      CREATININE               1.07                08/04/2020                BUN                      18                  08/04/2020                NA                       137                 08/04/2020                K                        4.4                 08/04/2020                CL                       101                 08/04/2020                CO2                      28                  08/04/2020             negative genitourinary   Musculoskeletal negative musculoskeletal ROS (+)   Abdominal   Peds  Hematology negative hematology ROS (+) Lab Results      Component                Value               Date  WBC                      8.6                 08/04/2020                HGB                      15.8                08/04/2020                HCT                      47.0                08/04/2020                MCV                      88.7                08/04/2020                PLT                      385                  08/04/2020              Anesthesia Other Findings   Reproductive/Obstetrics negative OB ROS                          Anesthesia Physical Anesthesia Plan  ASA: 2  Anesthesia Plan: General   Post-op Pain Management:    Induction: Intravenous  PONV Risk Score and Plan: Treatment may vary due to age or medical condition, Midazolam, Dexamethasone and Ondansetron  Airway Management Planned: LMA  Additional Equipment: None  Intra-op Plan:   Post-operative Plan: Extubation in OR  Informed Consent: I have reviewed the patients History and Physical, chart, labs and discussed the procedure including the risks, benefits and alternatives for the proposed anesthesia with the patient or authorized representative who has indicated his/her understanding and acceptance.     Dental advisory given  Plan Discussed with: CRNA  Anesthesia Plan Comments: (Has been labeled "difficulty airway" but intubated multiple times in the last few years w/ video laryngoscopy w/o issue )      Anesthesia Quick Evaluation

## 2020-08-09 NOTE — H&P (Signed)
Office Visit Report     07/27/2020   --------------------------------------------------------------------------------   Andrew Stevens  MRN: [id number]  DOB: [birth year], 60 year old Male  SSN:    PRIMARY CARE:  Orland Mustard, MD  REFERRING:  John Giovanni, MD  PROVIDER:  Raynelle Bring, M.D.  TREATING:  Daine Gravel, NP  LOCATION:  [facility] [phone]     --------------------------------------------------------------------------------   CC/HPI: 1. Urolithiasis  2. History of elevated PSA  3. Right proximal ureteral stricture with ureteral obstruction  4. Erectile dysfunction   [name] returns today for routine follow-up. Since his last visit, he denies any significant right-sided flank pain, hematuria, passage of any stones, or change in renal function. He has had some periodic "twinges" in his right flank but also in his left flank periodically. These have been fairly minimal and have not required even over-the-counter medication. His he did have a comprehensive laboratory assessment by his primary care physician in December. His serum creatinine at that time was 1.09. His PSA was 1.74. He continues to have erectile dysfunction. I did prescribe him sildenafil after his last visit but he is not found this to be particularly effective. He does continue to have a fair amount of anxiety and worry about many of his medical conditions. He continues to take potassium citrate for treatment of his hypocitraturia and history of kidney stones. He also remains on chlorthalidone.   Interval: He presents today for preoperative appointment prior to undergoing URS on 08/10/20 for a 1.2 cm stone in his left proximal ureter. He has 83% relative function from the left kidney and his Cr bumped to 2.85. Dr. [name] placed a stent for this reason on 06/20/20. He presents today to repeat his renal function prior to surgery.     ALLERGIES: None   MEDICATIONS: Lipitor  Metformin  Hcl  Potassium Citrate Er 10 meq (1,080 mg) tablet, extended release 2 tablet PO BID  Sildenafil Citrate 100 mg tablet 1 tablet PO as directed PRN  Tadalafil 20 mg tablet 1 tablet PO prn as directed  Amlodipine Besylate  Chlorthalidone  Micardis     GU PSH: Cysto Remove Stent FB Sim - 12/08/2018, 2020 Cysto Uretero Lithotripsy Cysto/uretero W/up Stricture, Right - 2020 Cystoscopy Ureteroscopy Locm 300-399Mg/Ml Iodine,1Ml - 04/09/2019 Ureteroscopic laser litho, Right - 11/23/2018, Right - 2020 Ureteroscopic stone removal, Right - 2020       PSH Notes: Colloid cyst removal from head 1989   NON-GU PSH: Hernia Repair     GU PMH: ED due to arterial insufficiency - 03/17/2020, - 2020 Elevated PSA - 03/17/2020, - 2020 Renal and ureteral calculus - 03/17/2020, - 2020 Ureteral stricture - 03/17/2020, - 2020 Right renal neoplasm - 03/26/2019      PMH Notes:   1) Urolithiasis: He has a history of recurrent stone disease. He was noted to have a large impacted 1.9 cm proximal right ureteral stone in the spring of 2019 and underwent ureteroscopic laser lithotripsy. Follow up imaging in November 2019 indicated a 3 mm proximal right ureteral stone at the UPJ and non-obstructing stones. Attempt at ureteroscopic treatment revealed a ureteral stricture and a stent was placed. He was referred for further evaluation due to his stricture and stones in March 2020.   Current medical therapy Potassium citrate 20 mEq bid   1989: ESWL  Spring 2019: R ureteroscopic laser lithotripsy 1.9 cm right proximal ureteral stone [name])  Dec 2019: Right ureteroscopy with stricture noted at UPJ (stent placed)  May 2020: R ureteroscopy with laser lithotripsy of right renal calculi and impacted right UPJ stone with stent placement (mild stricture noted prior to treatment of impacted stone) - Calcium oxalate monohydrate  Jun 2020: Stent removal  Sep 2020: CT urogram - Persistent calcification in vicinity of proximal  ureter and lower pole calculi, Renogram with 21% relative right renal function and obstruction  Oct 2020: 24 hr urine - Low volume and hypocitraturia, began potassium citrate 20 mEq bid  Oct 2020: Ureteroscopy with balloon dilation of right ureteral stricture and laser lithotripsy and removal of lower pole calculi (no intraureteral calculi noted), 8 x 26 right ureteral stent  Nov 2020: Right ureteral stent removal   2) Elevated PSA: His PSA was elevated at 5.7 in October 2019. His PSA was repeated in March 2020 and decreased to 1.34.   3) Erectile dysfunction: He does not take nitrates.   Current treatment: Sildenafil   NON-GU PMH: Hypertension    FAMILY HISTORY: None    Notes: 2 daughters    SOCIAL HISTORY: Marital Status: Married Preferred Language: English; Ethnicity: Not Hispanic Or Latino; Race: Other Race Current Smoking Status: Patient has never smoked.   Tobacco Use Assessment Completed: Used Tobacco in last 30 days? Drinks 2 drinks per day.  Drinks 3 caffeinated drinks per day.    REVIEW OF SYSTEMS:    GU Review Male:   hematuria. Patient reports frequent urination and hard to postpone urination. Patient denies burning/ pain with urination, get up at night to urinate, leakage of urine, stream starts and stops, trouble starting your stream, have to strain to urinate , erection problems, and penile pain.  Gastrointestinal (Upper):   Patient denies nausea, vomiting, and indigestion/ heartburn.  Gastrointestinal (Lower):   Patient denies diarrhea and constipation.  Constitutional:   Patient denies fever, night sweats, weight loss, and fatigue.  Musculoskeletal:   Patient denies back pain and joint pain.  Neurological:   Patient denies headaches and dizziness.  Psychologic:   Patient denies depression and anxiety.   VITAL SIGNS:      07/27/2020 02:32 PM  Weight 198 lb / 89.81 kg  Height 70 in / 177.8 cm  BP 170/89 mmHg  Pulse 88 /min  Temperature 97.8 F / 36.5 C  BMI  28.4 kg/m   MULTI-SYSTEM PHYSICAL EXAMINATION:    Constitutional: Well-nourished. No physical deformities. Normally developed. Good grooming.  Respiratory: No labored breathing, no use of accessory muscles.   Cardiovascular: Normal temperature, normal extremity pulses, no swelling, no varicosities.  Skin: No paleness, no jaundice, no cyanosis. No lesion, no ulcer, no rash.  Neurologic / Psychiatric: Oriented to time, oriented to place, oriented to person. No depression, no anxiety, no agitation.     Complexity of Data:  Source Of History:  Patient  Lab Test Review:   CMP  Records Review:   Previous Doctor Records, Previous Patient Records  Urine Test Review:   Urinalysis   03/05/19 03/31/18  PSA  Total PSA 1.72 ng/mL 1.34 ng/mL    07/27/20  General Chemistry  Sodium 133 mEq/L  Potassium 4.5 mEq/L  BUN 17 mg/dL  Creatinine 0.8 mg/dL  Chloride 98 mEq/L  CO2 32 mEq/L  Glucose 115 mg/dL  Calcium 9.2 mg/dL  Protein, Total 7.8 g/dL  Albumin 4.0 g/dL  Bilirubin, Total 0.6 mg/dL  Alkaline Phosphatase 49 IU/L  ALT 14 IU/L  AST 23 IU/L  eGFR African American 111.7   eGFR Non-Afr. American 96.4   BUN/Creatinine Ratio 21.3 Ratio  Urinalysis  Urine Appearance Cloudy   Urine Color Yellow   Urine Glucose Neg mg/dL  Urine Bilirubin Neg mg/dL  Urine Ketones Neg mg/dL  Urine Specific Gravity 1.020   Urine Blood 3+ ery/uL  Urine pH 6.0   Urine Protein 2+ mg/dL  Urine Urobilinogen 0.2 mg/dL  Urine Nitrites Neg   Urine Leukocyte Esterase Trace leu/uL  Urine WBC/hpf 0 - 5/hpf   Urine RBC/hpf >60/hpf   Urine Epithelial Cells 0 - 5/hpf   Urine Bacteria Rare (0-9/hpf)   Urine Mucous Not Present   Urine Yeast NS (Not Seen)   Urine Trichomonas Not Present   Urine Cystals NS (Not Seen)   Urine Casts NS (Not Seen)   Urine Sperm Not Present   Urine C&S  Culture, Urine -    PROCEDURES:          Urinalysis w/Scope Dipstick Dipstick Cont'd Micro  Color: Yellow Bilirubin: Neg mg/dL  WBC/hpf: 0 - 5/hpf  Appearance: Cloudy Ketones: Neg mg/dL RBC/hpf: >60/hpf  Specific Gravity: 1.020 Blood: 3+ ery/uL Bacteria: Rare (0-9/hpf)  pH: 6.0 Protein: 2+ mg/dL Cystals: NS (Not Seen)  Glucose: Neg mg/dL Urobilinogen: 0.2 mg/dL Casts: NS (Not Seen)    Nitrites: Neg Trichomonas: Not Present    Leukocyte Esterase: Trace leu/uL Mucous: Not Present      Epithelial Cells: 0 - 5/hpf      Yeast: NS (Not Seen)      Sperm: Not Present    ASSESSMENT:      ICD-10 Details  1 GU:   Renal and ureteral calculus - N20.2 Left, Acute, Systemic Symptoms   PLAN:           Orders Labs CMP, CULTURE, URINE          Schedule Return Visit/Planned Activity: Keep Scheduled Appointment          Document Letter(s):  Created for Patient: Clinical Summary         Notes:   1. Ureteral Calculus: renal function drawn today and urine sent for culture. He is tolerating stent well. Will follo wup with him regarding results. HE was given strict return precautions for any fevers, chills, nausea, vomiting or progressive symptoms. return as scheduled for URS with Dr. Alinda Money.         Next Appointment:      Next Appointment: 08/10/2020 04:00 PM    Appointment Type: Surgery     Location: [facility] [phone]    Provider: Raynelle Bring, M.D.    Reason for Visit: WL/OP CYSTO, LT URS LL , LT UR STENT      * Signed by Daine Gravel, NP on 08/03/20 at 5:58 PM (EDT)*

## 2020-08-10 ENCOUNTER — Ambulatory Visit (HOSPITAL_COMMUNITY)
Admission: RE | Admit: 2020-08-10 | Discharge: 2020-08-10 | Disposition: A | Discharge status: Home / Self Care | Payer: No Typology Code available for payment source | Attending: Urology | Admitting: Urology

## 2020-08-10 ENCOUNTER — Encounter (HOSPITAL_COMMUNITY)
Admission: RE | Disposition: A | Discharge status: Home / Self Care | Payer: Self-pay | Source: Home / Self Care | Attending: Urology

## 2020-08-10 ENCOUNTER — Encounter (HOSPITAL_COMMUNITY): Payer: Self-pay | Admitting: Urology

## 2020-08-10 ENCOUNTER — Ambulatory Visit (HOSPITAL_COMMUNITY): Payer: No Typology Code available for payment source | Admitting: Anesthesiology

## 2020-08-10 ENCOUNTER — Ambulatory Visit (HOSPITAL_COMMUNITY): Payer: No Typology Code available for payment source

## 2020-08-10 ENCOUNTER — Other Ambulatory Visit: Payer: Self-pay

## 2020-08-10 DIAGNOSIS — N201 Calculus of ureter: Secondary | ICD-10-CM | POA: Diagnosis present

## 2020-08-10 DIAGNOSIS — Z87442 Personal history of urinary calculi: Secondary | ICD-10-CM | POA: Insufficient documentation

## 2020-08-10 DIAGNOSIS — F419 Anxiety disorder, unspecified: Secondary | ICD-10-CM | POA: Diagnosis not present

## 2020-08-10 DIAGNOSIS — Z7984 Long term (current) use of oral hypoglycemic drugs: Secondary | ICD-10-CM | POA: Diagnosis not present

## 2020-08-10 DIAGNOSIS — N200 Calculus of kidney: Secondary | ICD-10-CM | POA: Diagnosis not present

## 2020-08-10 DIAGNOSIS — Z79899 Other long term (current) drug therapy: Secondary | ICD-10-CM | POA: Diagnosis not present

## 2020-08-10 DIAGNOSIS — N529 Male erectile dysfunction, unspecified: Secondary | ICD-10-CM | POA: Diagnosis not present

## 2020-08-10 DIAGNOSIS — N135 Crossing vessel and stricture of ureter without hydronephrosis: Secondary | ICD-10-CM | POA: Diagnosis not present

## 2020-08-10 HISTORY — PX: CYSTOSCOPY/URETEROSCOPY/HOLMIUM LASER/STENT PLACEMENT: SHX6546

## 2020-08-10 LAB — GLUCOSE, CAPILLARY
Glucose-Capillary: 138 mg/dL — ABNORMAL HIGH (ref 70–99)
Glucose-Capillary: 142 mg/dL — ABNORMAL HIGH (ref 70–99)

## 2020-08-10 SURGERY — CYSTOSCOPY/URETEROSCOPY/HOLMIUM LASER/STENT PLACEMENT
Anesthesia: General | Site: Ureter | Laterality: Left

## 2020-08-10 MED ORDER — FENTANYL CITRATE (PF) 100 MCG/2ML IJ SOLN
INTRAMUSCULAR | Status: AC
  Filled 2020-08-10: qty 2

## 2020-08-10 MED ORDER — SODIUM CHLORIDE 0.9 % IV SOLN
2.0000 g | INTRAVENOUS | Status: AC
  Administered 2020-08-10: 2 g via INTRAVENOUS
  Filled 2020-08-10: qty 2

## 2020-08-10 MED ORDER — ACETAMINOPHEN 10 MG/ML IV SOLN: 1000.0000 mg | Freq: Once | INTRAVENOUS | Status: DC | PRN

## 2020-08-10 MED ORDER — MIDAZOLAM HCL 2 MG/2ML IJ SOLN
INTRAMUSCULAR | Status: AC
  Filled 2020-08-10: qty 2

## 2020-08-10 MED ORDER — AMISULPRIDE (ANTIEMETIC) 5 MG/2ML IV SOLN: 10.0000 mg | Freq: Once | INTRAVENOUS | Status: DC | PRN

## 2020-08-10 MED ORDER — HYDROCODONE-ACETAMINOPHEN 5-325 MG PO TABS
1.0000 | ORAL_TABLET | Freq: Four times a day (QID) | ORAL | 0 refills | Status: DC | PRN
  Filled 2020-08-10: qty 15, 2d supply, fill #0

## 2020-08-10 MED ORDER — LACTATED RINGERS IV SOLN: INTRAVENOUS | Status: DC

## 2020-08-10 MED ORDER — LIDOCAINE 2% (20 MG/ML) 5 ML SYRINGE
INTRAMUSCULAR | Status: AC
  Filled 2020-08-10: qty 5

## 2020-08-10 MED ORDER — DEXAMETHASONE SODIUM PHOSPHATE 10 MG/ML IJ SOLN
INTRAMUSCULAR | Status: AC
  Filled 2020-08-10: qty 2

## 2020-08-10 MED ORDER — PHENYLEPHRINE HCL (PRESSORS) 10 MG/ML IV SOLN
INTRAVENOUS | Status: AC
  Filled 2020-08-10: qty 1

## 2020-08-10 MED ORDER — OXYCODONE HCL 5 MG PO TABS
5.0000 mg | ORAL_TABLET | Freq: Once | ORAL | Status: DC | PRN
Start: 2020-08-10 — End: 2020-08-10

## 2020-08-10 MED ORDER — MIDAZOLAM HCL 5 MG/5ML IJ SOLN
INTRAMUSCULAR | Status: DC | PRN
  Administered 2020-08-10: 2 mg via INTRAVENOUS

## 2020-08-10 MED ORDER — SODIUM CHLORIDE 0.9 % IR SOLN
Status: DC | PRN
  Administered 2020-08-10 (×2): 3000 mL via INTRAVESICAL

## 2020-08-10 MED ORDER — FENTANYL CITRATE (PF) 100 MCG/2ML IJ SOLN
INTRAMUSCULAR | Status: DC | PRN
  Administered 2020-08-10: 25 ug via INTRAVENOUS
  Administered 2020-08-10: 50 ug via INTRAVENOUS
  Administered 2020-08-10: 25 ug via INTRAVENOUS
  Administered 2020-08-10: 50 ug via INTRAVENOUS

## 2020-08-10 MED ORDER — HYDROMORPHONE HCL 1 MG/ML IJ SOLN: 0.2500 mg | INTRAMUSCULAR | Status: DC | PRN

## 2020-08-10 MED ORDER — ONDANSETRON HCL 4 MG/2ML IJ SOLN: 4.0000 mg | Freq: Once | INTRAMUSCULAR | Status: DC | PRN

## 2020-08-10 MED ORDER — CHLORHEXIDINE GLUCONATE 0.12 % MT SOLN
15.0000 mL | Freq: Once | OROMUCOSAL | Status: AC
  Administered 2020-08-10: 15 mL via OROMUCOSAL

## 2020-08-10 MED ORDER — PROPOFOL 10 MG/ML IV BOLUS
INTRAVENOUS | Status: AC
  Filled 2020-08-10: qty 20

## 2020-08-10 MED ORDER — ORAL CARE MOUTH RINSE: 15.0000 mL | Freq: Once | OROMUCOSAL | Status: AC

## 2020-08-10 MED ORDER — EPHEDRINE SULFATE 50 MG/ML IJ SOLN
INTRAMUSCULAR | Status: DC | PRN
  Administered 2020-08-10 (×3): 10 mg via INTRAVENOUS
  Administered 2020-08-10 (×2): 5 mg via INTRAVENOUS

## 2020-08-10 MED ORDER — PROPOFOL 10 MG/ML IV BOLUS
INTRAVENOUS | Status: DC | PRN
  Administered 2020-08-10: 100 mg via INTRAVENOUS
  Administered 2020-08-10: 200 mg via INTRAVENOUS

## 2020-08-10 MED ORDER — LIDOCAINE HCL (CARDIAC) PF 100 MG/5ML IV SOSY
PREFILLED_SYRINGE | INTRAVENOUS | Status: DC | PRN
  Administered 2020-08-10: 100 mg via INTRAVENOUS

## 2020-08-10 MED ORDER — DEXMEDETOMIDINE (PRECEDEX) IN NS 20 MCG/5ML (4 MCG/ML) IV SYRINGE
PREFILLED_SYRINGE | INTRAVENOUS | Status: AC
  Filled 2020-08-10: qty 5

## 2020-08-10 MED ORDER — OXYCODONE HCL 5 MG/5ML PO SOLN
5.0000 mg | Freq: Once | ORAL | Status: DC | PRN
Start: 2020-08-10 — End: 2020-08-10

## 2020-08-10 MED ORDER — DEXAMETHASONE SODIUM PHOSPHATE 4 MG/ML IJ SOLN
INTRAMUSCULAR | Status: DC | PRN
  Administered 2020-08-10: 8 mg via INTRAVENOUS

## 2020-08-10 MED ORDER — DEXMEDETOMIDINE (PRECEDEX) IN NS 20 MCG/5ML (4 MCG/ML) IV SYRINGE
PREFILLED_SYRINGE | INTRAVENOUS | Status: DC | PRN
  Administered 2020-08-10: 8 ug via INTRAVENOUS

## 2020-08-10 MED ORDER — ONDANSETRON HCL 4 MG/2ML IJ SOLN
INTRAMUSCULAR | Status: DC | PRN
  Administered 2020-08-10: 4 mg via INTRAVENOUS

## 2020-08-10 SURGICAL SUPPLY — 22 items
BAG URO CATCHER STRL LF (MISCELLANEOUS) ×2 IMPLANT
BASKET ZERO TIP NITINOL 2.4FR (BASKET) ×4 IMPLANT
CATH INTERMIT  6FR 70CM (CATHETERS) ×2 IMPLANT
CLOTH BEACON ORANGE TIMEOUT ST (SAFETY) ×2 IMPLANT
GLOVE SURG ENC TEXT LTX SZ7.5 (GLOVE) ×2 IMPLANT
GLOVE SURG UNDER POLY LF SZ6 (GLOVE) ×6 IMPLANT
GOWN STRL REUS W/TWL LRG LVL3 (GOWN DISPOSABLE) ×6 IMPLANT
GUIDEWIRE STR DUAL SENSOR (WIRE) ×2 IMPLANT
GUIDEWIRE ZIPWRE .038 STRAIGHT (WIRE) IMPLANT
IV NS 1000ML (IV SOLUTION)
IV NS 1000ML BAXH (IV SOLUTION) IMPLANT
IV NS IRRIG 3000ML ARTHROMATIC (IV SOLUTION) ×4 IMPLANT
KIT TURNOVER KIT A (KITS) ×2 IMPLANT
LASER FIB FLEXIVA PULSE ID 365 (Laser) IMPLANT
MANIFOLD NEPTUNE II (INSTRUMENTS) ×2 IMPLANT
PACK CYSTO (CUSTOM PROCEDURE TRAY) ×2 IMPLANT
SHEATH URETERAL 12FRX35CM (MISCELLANEOUS) ×2 IMPLANT
STENT URET 6FRX26 CONTOUR (STENTS) ×2 IMPLANT
TRACTIP FLEXIVA PULS ID 200XHI (Laser) ×1 IMPLANT
TRACTIP FLEXIVA PULSE ID 200 (Laser) ×1
TUBING CONNECTING 10 (TUBING) ×2 IMPLANT
TUBING UROLOGY SET (TUBING) ×2 IMPLANT

## 2020-08-10 NOTE — Transfer of Care (Signed)
Immediate Anesthesia Transfer of Care Note  Patient: Andrew Stevens  Procedure(s) Performed: Procedure(s): CYSTOSCOPY/LEFT URETEROSCOPY/HOLMIUM LASER/STENT PLACEMENT (Left)  Patient Location: PACU  Anesthesia Type:General  Level of Consciousness:  sedated, patient cooperative and responds to stimulation  Airway & Oxygen Therapy:Patient Spontanous Breathing and Patient connected to face mask oxgen  Post-op Assessment:  Report given to PACU RN and Post -op Vital signs reviewed and stable  Post vital signs:  Reviewed and stable  Last Vitals:  Vitals:   08/10/20 1049  BP: (!) 150/86  Pulse: 66  Resp: 18  Temp: 36.9 C  SpO2: 01%    Complications: No apparent anesthesia complications

## 2020-08-10 NOTE — Anesthesia Postprocedure Evaluation (Signed)
Anesthesia Post Note  Patient: Andrew Stevens  Procedure(s) Performed: CYSTOSCOPY/LEFT URETEROSCOPY/HOLMIUM LASER/STENT PLACEMENT (Left: Ureter)     Patient location during evaluation: PACU Anesthesia Type: General Level of consciousness: awake and alert, oriented and patient cooperative Pain management: pain level controlled Vital Signs Assessment: post-procedure vital signs reviewed and stable Respiratory status: spontaneous breathing, nonlabored ventilation and respiratory function stable Cardiovascular status: blood pressure returned to baseline and stable Postop Assessment: no apparent nausea or vomiting Anesthetic complications: no   No notable events documented.  Last Vitals:  Vitals:   08/10/20 1348 08/10/20 1400  BP: 114/88 123/83  Pulse: 65 82  Resp: 16 15  Temp: 36.6 C   SpO2: 96% 100%    Last Pain:  Vitals:   08/10/20 1400  TempSrc:   PainSc: 0-No pain                 Pervis Hocking

## 2020-08-10 NOTE — Interval H&P Note (Signed)
History and Physical Interval Note:  08/10/2020 11:25 AM  Andrew Stevens  has presented today for surgery, with the diagnosis of LEFT URETERAL AND RENAL CALCULI.  The various methods of treatment have been discussed with the patient and family. After consideration of risks, benefits and other options for treatment, the patient has consented to  Procedure(s): CYSTOSCOPY/URETEROSCOPY/HOLMIUM LASER/STENT PLACEMENT (Left) as a surgical intervention.  The patient's history has been reviewed, patient examined, no change in status, stable for surgery.  I have reviewed the patient's chart and labs.  Questions were answered to the patient's satisfaction.     [employer]

## 2020-08-10 NOTE — Anesthesia Procedure Notes (Addendum)
Procedure Name: LMA Insertion Date/Time: 08/10/2020 11:54 AM Performed by: Lavina Hamman, CRNA Pre-anesthesia Checklist: Patient identified, Emergency Drugs available, Suction available and Patient being monitored Patient Re-evaluated:Patient Re-evaluated prior to induction Oxygen Delivery Method: Circle System Utilized Preoxygenation: Pre-oxygenation with 100% oxygen Induction Type: IV induction Ventilation: Mask ventilation without difficulty LMA: LMA with gastric port inserted LMA Size: 4.0 Number of attempts: 2 Airway Equipment and Method: Bite block Placement Confirmation: positive ETCO2 Tube secured with: Tape Dental Injury: Teeth and Oropharynx as per pre-operative assessment  Comments: LMA 4 changed to LMA 4 Proseal.

## 2020-08-10 NOTE — Op Note (Signed)
Preoperative diagnosis: Left UPJ calculus  Postoperative diagnosis: Left renal calculus  Procedure:  Cystoscopy Left ureteroscopy and stone removal Ureteroscopic laser lithotripsy Left ureteral stent placement (6 x 26 - no string) Left retrograde pyelography with interpretation  Surgeon: Pryor Curia. M.D.  Anesthesia: General  Complications: None  Intraoperative findings: Left retrograde pyelography demonstrated a filling defect within the left lower pole renal collecting system consistent with the patient's known calculus without other abnormalities noted.  EBL: Minimal  Specimens: Left renal calculus  Disposition of specimens: [facility] for stone analysis  Indication: Andrew Stevens  is a 60 y.o. patient with urolithiasis. He has a mostly left solitary functional kidney and he recently presented with AKI and a 1.2 cm left UPJ calculus and underwent stent placement with resolution of his AKI. After reviewing the management options for treatment, they elected to proceed with the above surgical procedure(s). We have discussed the potential benefits and risks of the procedure, side effects of the proposed treatment, the likelihood of the patient achieving the goals of the procedure, and any potential problems that might occur during the procedure or recuperation. Informed consent has been obtained.  Description of procedure:  The patient was taken to the operating room and general anesthesia was induced.  The patient was placed in the dorsal lithotomy position, prepped and draped in the usual sterile fashion, and preoperative antibiotics were administered. A preoperative time-out was performed.   Cystourethroscopy was performed.  The patient's urethra was examined and was normal. The bladder was then systematically examined in its entirety. There was no evidence for any bladder tumors, stones, or other mucosal pathology.    Attention then turned to the  left ureteral orifice and the indwelling stent was removed.  A 0.38 sensor guidewire was then advanced up the left ureter into the renal pelvis under fluoroscopic guidance.  A 12/14 Fr ureteral access sheath was then advanced over the guide wire. The digital flexible ureteroscope was then advanced through the access sheath into the ureter next to the guidewire and the calculus was identified and was located in the lower pole with a smaller stone in the upper pole.   The stone was then fragmented with the 200 micron holmium laser fiber on a setting of 0.3J and frequency of 60 Hz.  Much of the stone was dusted with some fragments left.   All sizable stones were then removed with a zero tip nitinol basket.  Reinspection of the ureter/renal pelvis revealed no remaining visible stones or fragments of significant size.   Any remaining small fragments were further dusted into small, < 2 mm fragments with the laser.  The safety wire was then replaced and the access sheath removed.  The guidewire was backloaded through the cystoscope and a ureteral stent was advance over the wire using Seldinger technique.  The stent was positioned appropriately under fluoroscopic and cystoscopic guidance.  The wire was then removed with an adequate stent curl noted in the renal pelvis as well as in the bladder.  The bladder was then emptied and the procedure ended.  The patient appeared to tolerate the procedure well and without complications.  The patient was able to be awakened and transferred to the recovery unit in satisfactory condition.   Pryor Curia MD

## 2020-08-10 NOTE — Discharge Instructions (Signed)

## 2020-08-11 ENCOUNTER — Encounter (HOSPITAL_COMMUNITY): Payer: Self-pay | Admitting: Urology

## 2020-08-16 MED FILL — Sildenafil Citrate Tab 100 MG: ORAL | 30 days supply | Qty: 6 | Fill #3 | Status: AC

## 2020-08-17 ENCOUNTER — Other Ambulatory Visit: Payer: Self-pay

## 2020-08-18 ENCOUNTER — Other Ambulatory Visit: Payer: Self-pay

## 2020-08-18 MED ORDER — POTASSIUM CITRATE ER 10 MEQ (1080 MG) PO TBCR
EXTENDED_RELEASE_TABLET | Freq: Two times a day (BID) | ORAL | 11 refills | Status: DC
  Filled 2020-08-18: qty 120, 30d supply, fill #0
  Filled 2020-08-25 – 2020-09-26 (×2): qty 120, 30d supply, fill #1
  Filled 2020-11-09: qty 120, 30d supply, fill #2
  Filled 2020-12-25 – 2021-01-16 (×2): qty 120, 30d supply, fill #3
  Filled 2021-02-25: qty 120, 30d supply, fill #4
  Filled 2021-04-07: qty 120, 30d supply, fill #5
  Filled 2021-07-30: qty 120, 30d supply, fill #6

## 2020-08-24 ENCOUNTER — Other Ambulatory Visit: Payer: Self-pay

## 2020-08-25 ENCOUNTER — Other Ambulatory Visit: Payer: Self-pay

## 2020-08-25 ENCOUNTER — Other Ambulatory Visit: Payer: Self-pay | Admitting: Internal Medicine

## 2020-08-28 ENCOUNTER — Other Ambulatory Visit: Payer: Self-pay

## 2020-08-29 ENCOUNTER — Other Ambulatory Visit: Payer: Self-pay

## 2020-09-06 ENCOUNTER — Other Ambulatory Visit: Payer: Self-pay

## 2020-09-06 ENCOUNTER — Other Ambulatory Visit: Payer: Self-pay | Admitting: Internal Medicine

## 2020-09-06 ENCOUNTER — Encounter: Payer: Self-pay | Admitting: Internal Medicine

## 2020-09-06 DIAGNOSIS — I10 Essential (primary) hypertension: Secondary | ICD-10-CM

## 2020-09-07 ENCOUNTER — Other Ambulatory Visit: Payer: Self-pay

## 2020-09-08 ENCOUNTER — Other Ambulatory Visit: Payer: Self-pay

## 2020-09-08 MED ORDER — AMLODIPINE BESYLATE 10 MG PO TABS
10.0000 mg | ORAL_TABLET | ORAL | 1 refills | Status: DC
  Filled 2020-09-08: qty 90, 90d supply, fill #0
  Filled 2020-12-10: qty 90, 90d supply, fill #1

## 2020-09-08 MED ORDER — CHLORTHALIDONE 25 MG PO TABS
25.0000 mg | ORAL_TABLET | Freq: Every day | ORAL | 1 refills | Status: DC
  Filled 2020-09-08: qty 90, 90d supply, fill #0
  Filled 2020-12-11: qty 90, 90d supply, fill #1

## 2020-09-14 ENCOUNTER — Other Ambulatory Visit: Payer: Self-pay

## 2020-09-14 MED FILL — Sildenafil Citrate Tab 100 MG: ORAL | 30 days supply | Qty: 6 | Fill #4 | Status: AC

## 2020-09-15 ENCOUNTER — Other Ambulatory Visit: Payer: Self-pay

## 2020-09-15 MED FILL — Metformin HCl Tab 500 MG: ORAL | 90 days supply | Qty: 180 | Fill #1 | Status: AC

## 2020-09-19 ENCOUNTER — Other Ambulatory Visit: Payer: Self-pay

## 2020-09-19 MED ORDER — SILDENAFIL CITRATE 100 MG PO TABS
ORAL_TABLET | ORAL | 11 refills | Status: DC
  Filled 2020-09-19: qty 10, 30d supply, fill #0
  Filled 2020-09-28 – 2020-10-10 (×2): qty 6, 30d supply, fill #0
  Filled 2020-11-09: qty 6, 30d supply, fill #1
  Filled 2020-12-10: qty 6, 30d supply, fill #2
  Filled 2021-01-09: qty 6, 30d supply, fill #3
  Filled 2021-02-09: qty 6, 30d supply, fill #4
  Filled 2021-03-10: qty 6, 30d supply, fill #5
  Filled 2021-04-07: qty 6, 30d supply, fill #6
  Filled 2021-05-09: qty 6, 30d supply, fill #7
  Filled 2021-06-29: qty 6, 30d supply, fill #8
  Filled 2021-07-30: qty 6, 30d supply, fill #9
  Filled 2021-09-01: qty 6, 30d supply, fill #10

## 2020-09-27 ENCOUNTER — Other Ambulatory Visit: Payer: Self-pay

## 2020-09-28 ENCOUNTER — Other Ambulatory Visit: Payer: Self-pay

## 2020-10-10 ENCOUNTER — Other Ambulatory Visit: Payer: Self-pay

## 2020-10-16 ENCOUNTER — Other Ambulatory Visit: Payer: Self-pay | Admitting: Internal Medicine

## 2020-10-16 ENCOUNTER — Other Ambulatory Visit: Payer: Self-pay

## 2020-10-17 ENCOUNTER — Other Ambulatory Visit: Payer: Self-pay | Admitting: Internal Medicine

## 2020-10-17 ENCOUNTER — Other Ambulatory Visit: Payer: Self-pay

## 2020-10-17 DIAGNOSIS — I10 Essential (primary) hypertension: Secondary | ICD-10-CM

## 2020-10-17 MED FILL — Telmisartan Tab 80 MG: ORAL | 90 days supply | Qty: 90 | Fill #0 | Status: AC

## 2020-11-09 ENCOUNTER — Other Ambulatory Visit: Payer: Self-pay

## 2020-11-10 ENCOUNTER — Other Ambulatory Visit: Payer: Self-pay

## 2020-11-10 ENCOUNTER — Other Ambulatory Visit: Payer: Self-pay | Admitting: Internal Medicine

## 2020-11-13 ENCOUNTER — Other Ambulatory Visit: Payer: Self-pay

## 2020-11-13 MED ORDER — ATORVASTATIN CALCIUM 20 MG PO TABS
ORAL_TABLET | Freq: Every day | ORAL | 0 refills | Status: DC
  Filled 2020-11-13 – 2020-12-11 (×2): qty 90, 90d supply, fill #0

## 2020-11-27 ENCOUNTER — Other Ambulatory Visit: Payer: Self-pay

## 2020-12-11 ENCOUNTER — Other Ambulatory Visit: Payer: Self-pay

## 2020-12-12 ENCOUNTER — Other Ambulatory Visit: Payer: Self-pay

## 2020-12-26 ENCOUNTER — Other Ambulatory Visit: Payer: Self-pay

## 2021-01-09 ENCOUNTER — Other Ambulatory Visit: Payer: Self-pay

## 2021-01-10 ENCOUNTER — Other Ambulatory Visit: Payer: Self-pay

## 2021-01-17 ENCOUNTER — Other Ambulatory Visit: Payer: Self-pay

## 2021-01-17 ENCOUNTER — Ambulatory Visit (INDEPENDENT_AMBULATORY_CARE_PROVIDER_SITE_OTHER): Payer: No Typology Code available for payment source | Admitting: Internal Medicine

## 2021-01-17 ENCOUNTER — Encounter: Payer: Self-pay | Admitting: Internal Medicine

## 2021-01-17 VITALS — BP 118/76 | HR 79 | Temp 97.2°F | Ht 68.58 in | Wt 192.6 lb

## 2021-01-17 DIAGNOSIS — R319 Hematuria, unspecified: Secondary | ICD-10-CM | POA: Diagnosis not present

## 2021-01-17 DIAGNOSIS — E559 Vitamin D deficiency, unspecified: Secondary | ICD-10-CM | POA: Diagnosis not present

## 2021-01-17 DIAGNOSIS — I10 Essential (primary) hypertension: Secondary | ICD-10-CM

## 2021-01-17 DIAGNOSIS — Z0100 Encounter for examination of eyes and vision without abnormal findings: Secondary | ICD-10-CM

## 2021-01-17 DIAGNOSIS — Z1211 Encounter for screening for malignant neoplasm of colon: Secondary | ICD-10-CM | POA: Diagnosis not present

## 2021-01-17 DIAGNOSIS — I152 Hypertension secondary to endocrine disorders: Secondary | ICD-10-CM

## 2021-01-17 DIAGNOSIS — Z125 Encounter for screening for malignant neoplasm of prostate: Secondary | ICD-10-CM | POA: Diagnosis not present

## 2021-01-17 DIAGNOSIS — Z0000 Encounter for general adult medical examination without abnormal findings: Secondary | ICD-10-CM | POA: Diagnosis not present

## 2021-01-17 DIAGNOSIS — E119 Type 2 diabetes mellitus without complications: Secondary | ICD-10-CM

## 2021-01-17 DIAGNOSIS — Z1329 Encounter for screening for other suspected endocrine disorder: Secondary | ICD-10-CM | POA: Diagnosis not present

## 2021-01-17 DIAGNOSIS — E1159 Type 2 diabetes mellitus with other circulatory complications: Secondary | ICD-10-CM

## 2021-01-17 LAB — LIPID PANEL
Cholesterol: 141 mg/dL (ref 0–200)
HDL: 53.2 mg/dL (ref >39.00)
LDL Cholesterol: 75 mg/dL (ref 0–99)
NonHDL: 87.66
Total CHOL/HDL Ratio: 3
Triglycerides: 64 mg/dL (ref 0.0–149.0)
VLDL: 12.8 mg/dL (ref 0.0–40.0)

## 2021-01-17 LAB — CBC WITH DIFFERENTIAL/PLATELET
Basophils Absolute: 0.1 10*3/uL (ref 0.0–0.1)
Basophils Relative: 0.7 % (ref 0.0–3.0)
Eosinophils Absolute: 0.1 10*3/uL (ref 0.0–0.7)
Eosinophils Relative: 1.4 % (ref 0.0–5.0)
HCT: 43.9 % (ref 39.0–52.0)
Hemoglobin: 14.9 g/dL (ref 13.0–17.0)
Lymphocytes Relative: 11.6 % — ABNORMAL LOW (ref 12.0–46.0)
Lymphs Abs: 0.9 10*3/uL (ref 0.7–4.0)
MCHC: 34.1 g/dL (ref 30.0–36.0)
MCV: 87.6 fl (ref 78.0–100.0)
Monocytes Absolute: 0.5 10*3/uL (ref 0.1–1.0)
Monocytes Relative: 6.3 % (ref 3.0–12.0)
Neutro Abs: 6.3 10*3/uL (ref 1.4–7.7)
Neutrophils Relative %: 80 % — ABNORMAL HIGH (ref 43.0–77.0)
Platelets: 342 10*3/uL (ref 150.0–400.0)
RBC: 5.01 Mil/uL (ref 4.22–5.81)
RDW: 13.1 % (ref 11.5–15.5)
WBC: 7.8 10*3/uL (ref 4.0–10.5)

## 2021-01-17 LAB — COMPREHENSIVE METABOLIC PANEL
ALT: 17 U/L (ref 0–53)
AST: 20 U/L (ref 0–37)
Albumin: 4.3 g/dL (ref 3.5–5.2)
Alkaline Phosphatase: 57 U/L (ref 39–117)
BUN: 17 mg/dL (ref 6–23)
CO2: 34 mEq/L — ABNORMAL HIGH (ref 19–32)
Calcium: 9.9 mg/dL (ref 8.4–10.5)
Chloride: 97 mEq/L (ref 96–112)
Creatinine, Ser: 1.13 mg/dL (ref 0.40–1.50)
GFR: 69.81 mL/min (ref >60.00)
Glucose, Bld: 122 mg/dL — ABNORMAL HIGH (ref 70–99)
Potassium: 4.6 mEq/L (ref 3.5–5.1)
Sodium: 137 mEq/L (ref 135–145)
Total Bilirubin: 1 mg/dL (ref 0.2–1.2)
Total Protein: 7.3 g/dL (ref 6.0–8.3)

## 2021-01-17 LAB — PSA: PSA: 1.89 ng/mL (ref 0.10–4.00)

## 2021-01-17 LAB — HEMOGLOBIN A1C: Hgb A1c MFr Bld: 6.7 % — ABNORMAL HIGH (ref 4.6–6.5)

## 2021-01-17 LAB — VITAMIN D 25 HYDROXY (VIT D DEFICIENCY, FRACTURES): VITD: 37.14 ng/mL (ref 30.00–100.00)

## 2021-01-17 LAB — TSH: TSH: 1.2 u[IU]/mL (ref 0.35–5.50)

## 2021-01-17 MED ORDER — TELMISARTAN 80 MG PO TABS
80.0000 mg | ORAL_TABLET | Freq: Every day | ORAL | 3 refills | Status: DC
  Filled 2021-01-17: qty 90, 90d supply, fill #0
  Filled 2021-05-03: qty 90, 90d supply, fill #1
  Filled 2021-07-15: qty 90, 90d supply, fill #2
  Filled 2021-10-28 – 2021-11-01 (×2): qty 90, 90d supply, fill #3

## 2021-01-17 MED ORDER — AMLODIPINE BESYLATE 10 MG PO TABS
10.0000 mg | ORAL_TABLET | ORAL | 3 refills | Status: DC
  Filled 2021-01-17 – 2021-03-04 (×2): qty 90, 90d supply, fill #0
  Filled 2021-06-11: qty 90, 90d supply, fill #1
  Filled 2021-09-01: qty 90, 90d supply, fill #2
  Filled 2021-12-11: qty 90, 90d supply, fill #3

## 2021-01-17 MED ORDER — ATORVASTATIN CALCIUM 20 MG PO TABS
20.0000 mg | ORAL_TABLET | Freq: Every day | ORAL | 3 refills | Status: DC
  Filled 2021-01-17 – 2021-06-29 (×2): qty 90, 90d supply, fill #0
  Filled 2022-01-05: qty 90, 90d supply, fill #1

## 2021-01-17 MED ORDER — METFORMIN HCL 500 MG PO TABS
ORAL_TABLET | Freq: Two times a day (BID) | ORAL | 3 refills | Status: DC
  Filled 2021-01-17: qty 180, 90d supply, fill #0
  Filled 2021-05-11: qty 180, 90d supply, fill #1
  Filled 2021-09-01: qty 180, 90d supply, fill #2
  Filled 2022-01-05: qty 180, 90d supply, fill #3

## 2021-01-17 MED ORDER — CHLORTHALIDONE 25 MG PO TABS
25.0000 mg | ORAL_TABLET | Freq: Every day | ORAL | 3 refills | Status: DC
  Filled 2021-01-17 – 2021-03-10 (×2): qty 90, 90d supply, fill #0
  Filled 2021-06-24: qty 90, 90d supply, fill #1
  Filled 2021-10-02: qty 90, 90d supply, fill #2
  Filled 2022-01-05: qty 90, 90d supply, fill #3

## 2021-01-17 NOTE — Progress Notes (Signed)
Chief Complaint  Patient presents with   Annual Exam   Annual 1. Htn controlled on norvasc 10 mg qd and chlorathalidone 25 mg qd and micardis 80 mg  2. DM 2 on metformin 500 mg and lipitor 20 mg qhs  3. Recurrent kidney stones s/p procedure D.r Alinda Money x 4 last with stenting 07/2020 and will f/u in 03/2021   Review of Systems  Constitutional:  Negative for weight loss.  HENT:  Negative for hearing loss.   Eyes:  Negative for blurred vision.  Respiratory:  Negative for shortness of breath.   Cardiovascular:  Negative for chest pain.  Gastrointestinal:  Negative for abdominal pain and blood in stool.  Musculoskeletal:  Negative for back pain.  Skin:  Negative for rash.  Neurological:  Negative for headaches.  Psychiatric/Behavioral:  Negative for depression.   Past Medical History:  Diagnosis Date   Anxiety    Diabetes mellitus without complication ([place])    Difficult intubation    Erectile dysfunction    GERD (gastroesophageal reflux disease)    occ. takes rolaids or tums if needed   Headache    hisotry of with brain tumor   History of brain tumor    History of kidney stones 2019   Bilateral   History of umbilical hernia    Hydronephrosis 2019   Moderate right hydronephrosis   Hyperlipidemia    Hypertension    Lipoma of extremity    left inner thigh, right groin   Past Surgical History:  Procedure Laterality Date   BRAIN SURGERY  1989   Cyst removed - benign   COLONOSCOPY  2015   CYSTOSCOPY W/ URETERAL STENT PLACEMENT Right 05/27/2017   Procedure: CYSTOSCOPY WITH STENT REPLACEMENT;  Surgeon: [name], MD;  Location: [facility];  Service: Urology;  Laterality: Right;   CYSTOSCOPY WITH STENT PLACEMENT Right 01/23/2018   Procedure: CYSTOSCOPY WITH STENT PLACEMENT;  Surgeon: Billey Co, MD;  Location: [facility];  Service: Urology;  Laterality: Right;   CYSTOSCOPY WITH STENT PLACEMENT Left 06/20/2020   Procedure: CYSTOSCOPY WITH STENT PLACEMENT;  Surgeon: [name], MD;  Location: [facility];  Service: Urology;  Laterality: Left;   CYSTOSCOPY/RETROGRADE/URETEROSCOPY Right 01/23/2018   Procedure: CYSTOSCOPY/RETROGRADE/URETEROSCOPY;  Surgeon: Billey Co, MD;  Location: [facility];  Service: Urology;  Laterality: Right;   CYSTOSCOPY/URETEROSCOPY/HOLMIUM LASER Right 05/27/2017   Procedure: CYSTOSCOPY/URETEROSCOPY/HOLMIUM LASER;  Surgeon: [name], MD;  Location: [facility];  Service: Urology;  Laterality: Right;   CYSTOSCOPY/URETEROSCOPY/HOLMIUM LASER/STENT PLACEMENT Right 04/11/2017   Procedure: CYSTOSCOPY/URETEROSCOPY/HOLMIUM LASER/STENT PLACEMENT;  Surgeon: [name], MD;  Location: [facility];  Service: Urology;  Laterality: Right;   CYSTOSCOPY/URETEROSCOPY/HOLMIUM LASER/STENT PLACEMENT Right 06/18/2018   Procedure: CYSTOSCOPY/ RETROGRADE//URETEROSCOPY/HOLMIUM LASER/STENT PLACEMENT;  Surgeon: Raynelle Bring, MD;  Location: [facility];  Service: Urology;  Laterality: Right;   CYSTOSCOPY/URETEROSCOPY/HOLMIUM LASER/STENT PLACEMENT Right 11/23/2018   Procedure: CYSTOSCOPY/RETROGRADE PYELOGRAM/URETEROSCOPY/HOLMIUM LASER/STENT PLACEMENT;  Surgeon: Raynelle Bring, MD;  Location: [facility];  Service: Urology;  Laterality: Right;   CYSTOSCOPY/URETEROSCOPY/HOLMIUM LASER/STENT PLACEMENT Left 08/10/2020   Procedure: CYSTOSCOPY/LEFT URETEROSCOPY/HOLMIUM LASER/STENT PLACEMENT;  Surgeon: Raynelle Bring, MD;  Location: [facility];  Service: Urology;  Laterality: Left;   INGUINAL HERNIA REPAIR     MASS EXCISION Bilateral 04/22/2017   Procedure: EXCISION OF 3  MASSES OF PERIRECTAL, INNER THIGH AREAS;  Surgeon: Johnathan Hausen, MD;  Location: [facility];  Service: General;  Laterality: Bilateral;  LMA   ROTATOR CUFF REPAIR Right 2014   Dr. 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 Dog. [place].      3 grandchildren, 2 children, married to wife x 47 years as of 01/08/19   From [facility]      Regular Exercise -  NO   Daily Caffeine Use:  4 cups coffee in AM, 1 cup tea in AM         Social Determinants of Health   Financial Resource Strain: Not on file  Food Insecurity: Not on file  Transportation Needs: Not on file  Physical Activity: Not on file  Stress: Not on file  Social Connections: Not on file  Intimate Partner Violence: Not on file   Current Meds  Medication Sig   amLODipine (NORVASC) 10 MG tablet Take 1 tablet (10 mg total) by mouth every morning.   atorvastatin (LIPITOR) 20 MG tablet Take 1 tablet (20 mg total) by mouth at bedtime.   atorvastatin (LIPITOR) 20 MG tablet TAKE 1 TABLET (20 MG TOTAL) BY MOUTH AT BEDTIME.   chlorthalidone (HYGROTON) 25 MG tablet Take 1 tablet (25 mg total) by mouth daily.   potassium citrate (UROCIT-K) 10 MEQ (1080 MG) SR tablet Take 2 tablets (20 mEq total) by mouth 2 (two) times daily.   potassium citrate (UROCIT-K) 10 MEQ (1080 MG) SR tablet TAKE TWO TABLETS BY MOUTH TWICE DAILY   sildenafil (VIAGRA) 100 MG tablet 1 tablet PO as directed PRN   [DISCONTINUED] metFORMIN (GLUCOPHAGE) 500 MG tablet TAKE  1 TABLET BY MOUTH 2 (TWO) TIMES DAILY WITH A MEAL.   [DISCONTINUED] telmisartan (MICARDIS) 80 MG tablet TAKE 1 TABLET (80 MG TOTAL) BY MOUTH DAILY.   No Known Allergies No results found for this or any previous visit (from the past 2160 hour(s)). Objective  Body mass index is 28.79 kg/m. Wt Readings from Last 3 Encounters:  01/17/21 192 lb 9.6 oz (87.4 kg)  08/10/20 197 lb (89.4 kg)  08/04/20 197 lb (89.4 kg)   Temp Readings from Last 3 Encounters:  01/17/21 (!) 97.2 F (36.2 C) (Temporal)  08/10/20 97.8 F (36.6 C)  08/04/20 98.3 F (36.8 C) (Oral)   BP Readings from Last 3 Encounters:  01/17/21 118/76  08/10/20 134/81  08/04/20 (!) 158/92   Pulse Readings from Last 3 Encounters:  01/17/21 79  08/10/20 70  08/04/20 98  Physical Exam Vitals and nursing note reviewed.  Constitutional:      Appearance: Normal appearance. He is well-developed and well-groomed. He is obese.  HENT:     Head: Normocephalic and atraumatic.  Eyes:     Conjunctiva/sclera: Conjunctivae normal.     Pupils: Pupils are equal, round, and reactive to light.  Cardiovascular:     Rate and Rhythm: Normal rate and regular rhythm.     Heart sounds: Normal heart sounds.  Pulmonary:     Effort: Pulmonary effort is normal. No respiratory distress.     Breath sounds: Normal breath sounds.  Abdominal:     Tenderness: There is no abdominal tenderness.  Skin:    General: Skin is warm and moist.  Neurological:     General: No focal deficit present.     Mental Status: He is alert and oriented to person, place, and time. Mental status is at baseline.     Sensory: Sensation is intact.     Motor: Motor function is intact.     Coordination: Coordination is intact.     Gait: Gait is intact. Gait normal.  Psychiatric:        Attention and Perception: Attention and perception normal.        Mood and Affect: Mood and affect normal.        Speech: Speech normal.        Behavior: Behavior normal. Behavior is  cooperative.        Thought Content: Thought content normal.        Cognition and Memory: Cognition and memory normal.        Judgment: Judgment normal.    Assessment  Plan  Annual physical exam - Plan: Comprehensive metabolic panel, Lipid panel, CBC with Differential/Platelet, TSH, Urinalysis, Routine w reflex microscopic, Hemoglobin A1c, Vitamin D (25 hydroxy), PSA  Essential hypertension  controlled- Plan: telmisartan (MICARDIS) 80 MG tablet, chlorthalidone 25, norvasc 10 Comprehensive metabolic panel, Lipid panel, CBC with Differential/Platelet   Hematuria, unspecified type - Plan: Urinalysis, Routine w reflex microscopic,  Fu urology Dr. Alinda Money 03/2021  HM Had flu shot utd Tdap utd  pna 23 UTD  shingrix 2/2  Pfizer 3/3 consider 4th dose and prevnar vaccine Hep B immune hep C neg  Check fasting labs    UTD colonoscopy DUE Dr. Allen Norris referral placed  Sch 01/2021 Dr. Allen Norris   UTD PSA 03/31/2018 Dr. Alinda Money 1.34 will hold for this currently normal had reduced per Dr. Alinda Money procedure 07/2020, and f/u 03/2021  rec healthy diet choices and exercise   03/2021 [facility] recurrent kidney stones h/o elevated PSA 5.7 10/2017, PSA 03/2018 1.34  rec healthy diet and exercise    Provider: Dr. Olivia Mackie McLean-Scocuz[facility]

## 2021-01-17 NOTE — Patient Instructions (Signed)
Prevnar 13 and covid shot consider  Pneumococcal Conjugate Vaccine (Prevnar 13) Suspension for Injection What is this medication? PNEUMOCOCCAL VACCINE (NEU mo KOK al vak SEEN) is a vaccine used to prevent pneumococcus bacterial infections. These bacteria can cause serious infections like pneumonia, meningitis, and blood infections. This vaccine will lower your chance of getting pneumonia. If you do get pneumonia, it can make your symptoms milder and your illness shorter. This vaccine will not treat an infection and will not cause infection. This vaccine is recommended for infants and young children, adults with certain medical conditions, and adults 62 years or older. This medicine may be used for other purposes; ask your health care provider or pharmacist if you have questions. COMMON BRAND NAME(S): Prevnar, Prevnar 13 What should I tell my care team before I take this medication? They need to know if you have any of these conditions: bleeding problems fever immune system problems an unusual or allergic reaction to pneumococcal vaccine, diphtheria toxoid, other vaccines, latex, other medicines, foods, dyes, or preservatives pregnant or trying to get pregnant breast-feeding How should I use this medication? This vaccine is for injection into a muscle. It is given by a health care professional. A copy of Vaccine Information Statements will be given before each vaccination. Read this sheet carefully each time. The sheet may change frequently. Talk to your pediatrician regarding the use of this medicine in children. While this drug may be prescribed for children as young as 38 weeks old for selected conditions, precautions do apply. Overdosage: If you think you have taken too much of this medicine contact a poison control center or emergency room at once. NOTE: This medicine is only for you. Do not share this medicine with others. What if I miss a dose? It is important not to miss your dose. Call  your doctor or health care professional if you are unable to keep an appointment. What may interact with this medication? medicines for cancer chemotherapy medicines that suppress your immune function steroid medicines like prednisone or cortisone This list may not describe all possible interactions. Give your health care provider a list of all the medicines, herbs, non-prescription drugs, or dietary supplements you use. Also tell them if you smoke, drink alcohol, or use illegal drugs. Some items may interact with your medicine. What should I watch for while using this medication? Mild fever and pain should go away in 3 days or less. Report any unusual symptoms to your doctor or health care professional. What side effects may I notice from receiving this medication? Side effects that you should report to your doctor or health care professional as soon as possible: allergic reactions like skin rash, itching or hives, swelling of the face, lips, or tongue breathing problems confused fast or irregular heartbeat fever over 102 degrees F seizures unusual bleeding or bruising unusual muscle weakness Side effects that usually do not require medical attention (report to your doctor or health care professional if they continue or are bothersome): aches and pains diarrhea fever of 102 degrees F or less headache irritable loss of appetite pain, tender at site where injected trouble sleeping This list may not describe all possible side effects. Call your doctor for medical advice about side effects. You may report side effects to FDA at 1-800-FDA-1088. Where should I keep my medication? This does not apply. This vaccine is given in a clinic, pharmacy, doctor's office, or other health care setting and will not be stored at home. NOTE: This sheet is a  summary. It may not cover all possible information. If you have questions about this medicine, talk to your doctor, pharmacist, or health care  provider.  2022 Elsevier/Gold Standard (2013-10-21 00:00:00)

## 2021-01-18 ENCOUNTER — Encounter: Payer: Self-pay | Admitting: Internal Medicine

## 2021-01-18 LAB — URINALYSIS, ROUTINE W REFLEX MICROSCOPIC
Bilirubin Urine: NEGATIVE
Glucose, UA: NEGATIVE
Hgb urine dipstick: NEGATIVE
Ketones, ur: NEGATIVE
Leukocytes,Ua: NEGATIVE
Nitrite: NEGATIVE
Protein, ur: NEGATIVE
Specific Gravity, Urine: 1.018 (ref 1.001–1.035)
pH: 7.5 (ref 5.0–8.0)

## 2021-01-18 NOTE — Telephone Encounter (Signed)
Please advise, referral was only placed yesterday 01/18/21

## 2021-01-23 NOTE — Telephone Encounter (Signed)
Andrew Stevens, Rasheedah R  You 4 days ago   RY Yes they will call pt to sch  once his referral has been reviewed. I will call pt and give pt the number to call.   Noted

## 2021-01-30 ENCOUNTER — Other Ambulatory Visit: Payer: Self-pay

## 2021-01-31 ENCOUNTER — Telehealth: Payer: Self-pay

## 2021-01-31 NOTE — Telephone Encounter (Signed)
CALLED PATIENT NO ANSWER LEFT VOICEMAIL FOR A CALL BACK ? ?

## 2021-01-31 NOTE — Telephone Encounter (Signed)
CALLED PATIENT NO ANSWER LEFT VOICEMAIL FOR A CALL BACK °Letter sent °

## 2021-02-02 ENCOUNTER — Ambulatory Visit: Payer: No Typology Code available for payment source | Attending: Internal Medicine

## 2021-02-02 ENCOUNTER — Other Ambulatory Visit: Payer: Self-pay

## 2021-02-02 ENCOUNTER — Telehealth: Payer: Self-pay | Admitting: Internal Medicine

## 2021-02-02 DIAGNOSIS — Z23 Encounter for immunization: Secondary | ICD-10-CM

## 2021-02-02 MED ORDER — PFIZER COVID-19 VAC BIVALENT 30 MCG/0.3ML IM SUSP
INTRAMUSCULAR | 0 refills | Status: DC
  Filled 2021-02-02: qty 0.3, 1d supply, fill #0

## 2021-02-02 NOTE — Telephone Encounter (Signed)
Rejection Reason - Patient did not respond" Andrew Stevens said on Feb 01, 2021 1:04 PM  "lm 01/18/21 " Andrew Stevens said on Jan 25, 2021 11:00 AM  Msg from [place] [facility]

## 2021-02-02 NOTE — Progress Notes (Signed)
° °  Covid-19 Vaccination Clinic  Name:  PERKINS MOLINA    MRN: [id number] DOB: [birth year]  02/02/2021  Mr. Hortman was observed post Covid-19 immunization for 15 minutes without incident. He was provided with Vaccine Information Sheet and instruction to access the V-Safe system.   Mr. Yeley was instructed to call 911 with any severe reactions post vaccine: Difficulty breathing  Swelling of face and throat  A fast heartbeat  A bad rash all over body  Dizziness and weakness   Immunizations Administered     Name Date Dose VIS Date Route   Pfizer Covid-19 Vaccine Bivalent Booster 02/02/2021  2:11 PM 0.3 mL 09/27/2020 Intramuscular   Manufacturer: [place]   Lot: [id number]   [place]: [place], PharmD, MBA Clinical Acute Care Pharmacist

## 2021-02-09 ENCOUNTER — Other Ambulatory Visit: Payer: Self-pay

## 2021-02-13 ENCOUNTER — Telehealth: Payer: Self-pay | Admitting: Internal Medicine

## 2021-02-13 NOTE — Telephone Encounter (Signed)
Pt wife called stating that pt need a referral for a colonscopy in order to have it done by Darrin Allen Norris

## 2021-02-14 ENCOUNTER — Encounter: Payer: Self-pay | Admitting: Internal Medicine

## 2021-02-14 NOTE — Telephone Encounter (Signed)
See 02/15/20 Patient message encounter

## 2021-02-26 ENCOUNTER — Other Ambulatory Visit: Payer: Self-pay

## 2021-03-05 ENCOUNTER — Other Ambulatory Visit: Payer: Self-pay

## 2021-03-12 ENCOUNTER — Other Ambulatory Visit: Payer: Self-pay

## 2021-03-28 ENCOUNTER — Other Ambulatory Visit (HOSPITAL_COMMUNITY): Payer: Self-pay | Admitting: Urology

## 2021-03-28 DIAGNOSIS — N202 Calculus of kidney with calculus of ureter: Secondary | ICD-10-CM

## 2021-03-28 DIAGNOSIS — N135 Crossing vessel and stricture of ureter without hydronephrosis: Secondary | ICD-10-CM

## 2021-04-05 ENCOUNTER — Other Ambulatory Visit: Payer: Self-pay

## 2021-04-05 MED ORDER — NA SULFATE-K SULFATE-MG SULF 17.5-3.13-1.6 GM/177ML PO SOLN
1.0000 | Freq: Once | ORAL | 0 refills | Status: AC
  Filled 2021-04-05: qty 354, 1d supply, fill #0

## 2021-04-05 NOTE — Addendum Note (Signed)
Addended by: Eliseo Squires on: 04/05/2021 03:54 PM ? ? Modules accepted: Orders ? ?

## 2021-04-09 ENCOUNTER — Other Ambulatory Visit: Payer: Self-pay

## 2021-04-13 LAB — HM DIABETES EYE EXAM

## 2021-04-16 ENCOUNTER — Encounter: Payer: Self-pay | Admitting: Gastroenterology

## 2021-04-19 ENCOUNTER — Other Ambulatory Visit: Payer: Self-pay

## 2021-04-19 MED ORDER — POTASSIUM CITRATE ER 10 MEQ (1080 MG) PO TBCR
30.0000 meq | EXTENDED_RELEASE_TABLET | Freq: Two times a day (BID) | ORAL | 11 refills | Status: DC
  Filled 2021-04-19: qty 180, 30d supply, fill #0
  Filled 2021-06-11: qty 180, 30d supply, fill #1
  Filled 2021-07-15: qty 180, 30d supply, fill #2
  Filled 2021-09-01: qty 180, 30d supply, fill #3
  Filled 2021-10-14: qty 180, 30d supply, fill #4
  Filled 2021-11-18: qty 180, 30d supply, fill #5
  Filled 2022-01-05: qty 180, 30d supply, fill #6
  Filled 2022-02-16: qty 180, 30d supply, fill #7
  Filled 2022-03-30: qty 180, 30d supply, fill #8

## 2021-04-20 ENCOUNTER — Ambulatory Visit: Payer: No Typology Code available for payment source | Admitting: Anesthesiology

## 2021-04-20 ENCOUNTER — Encounter: Payer: Self-pay | Admitting: Gastroenterology

## 2021-04-20 ENCOUNTER — Ambulatory Visit
Admission: RE | Admit: 2021-04-20 | Discharge: 2021-04-20 | Disposition: A | Discharge status: Home / Self Care | Payer: No Typology Code available for payment source | Attending: Gastroenterology | Admitting: Gastroenterology

## 2021-04-20 ENCOUNTER — Encounter
Admission: RE | Disposition: A | Discharge status: Home / Self Care | Payer: Self-pay | Source: Home / Self Care | Attending: Gastroenterology

## 2021-04-20 ENCOUNTER — Other Ambulatory Visit: Payer: Self-pay

## 2021-04-20 DIAGNOSIS — E119 Type 2 diabetes mellitus without complications: Secondary | ICD-10-CM | POA: Insufficient documentation

## 2021-04-20 DIAGNOSIS — K635 Polyp of colon: Secondary | ICD-10-CM | POA: Diagnosis not present

## 2021-04-20 DIAGNOSIS — Z860100 Personal history of colon polyps, unspecified: Secondary | ICD-10-CM

## 2021-04-20 DIAGNOSIS — D122 Benign neoplasm of ascending colon: Secondary | ICD-10-CM | POA: Insufficient documentation

## 2021-04-20 DIAGNOSIS — Z79899 Other long term (current) drug therapy: Secondary | ICD-10-CM | POA: Diagnosis not present

## 2021-04-20 DIAGNOSIS — D125 Benign neoplasm of sigmoid colon: Secondary | ICD-10-CM | POA: Insufficient documentation

## 2021-04-20 DIAGNOSIS — D123 Benign neoplasm of transverse colon: Secondary | ICD-10-CM | POA: Diagnosis not present

## 2021-04-20 DIAGNOSIS — I10 Essential (primary) hypertension: Secondary | ICD-10-CM | POA: Insufficient documentation

## 2021-04-20 DIAGNOSIS — Z1211 Encounter for screening for malignant neoplasm of colon: Secondary | ICD-10-CM | POA: Diagnosis present

## 2021-04-20 DIAGNOSIS — K640 First degree hemorrhoids: Secondary | ICD-10-CM | POA: Diagnosis not present

## 2021-04-20 HISTORY — PX: COLONOSCOPY: SHX5424

## 2021-04-20 HISTORY — PX: POLYPECTOMY: SHX5525

## 2021-04-20 LAB — GLUCOSE, CAPILLARY: Glucose-Capillary: 137 mg/dL — ABNORMAL HIGH (ref 70–99)

## 2021-04-20 SURGERY — COLONOSCOPY
Anesthesia: General | Site: Rectum

## 2021-04-20 MED ORDER — ACETAMINOPHEN 160 MG/5ML PO SOLN: 325.0000 mg | ORAL | Status: DC | PRN

## 2021-04-20 MED ORDER — ONDANSETRON HCL 4 MG/2ML IJ SOLN: 4.0000 mg | Freq: Once | INTRAMUSCULAR | Status: DC | PRN

## 2021-04-20 MED ORDER — PROPOFOL 10 MG/ML IV BOLUS
INTRAVENOUS | Status: DC | PRN
  Administered 2021-04-20 (×10): 30 mg via INTRAVENOUS
  Administered 2021-04-20: 150 mg via INTRAVENOUS
  Administered 2021-04-20: 30 mg via INTRAVENOUS

## 2021-04-20 MED ORDER — STERILE WATER FOR IRRIGATION IR SOLN
Status: DC | PRN
  Administered 2021-04-20: 100 mL

## 2021-04-20 MED ORDER — ACETAMINOPHEN 325 MG PO TABS: 325.0000 mg | ORAL_TABLET | ORAL | Status: DC | PRN

## 2021-04-20 MED ORDER — LACTATED RINGERS IV SOLN: INTRAVENOUS | Status: DC

## 2021-04-20 MED ORDER — LIDOCAINE HCL (CARDIAC) PF 100 MG/5ML IV SOSY
PREFILLED_SYRINGE | INTRAVENOUS | Status: DC | PRN
  Administered 2021-04-20: 30 mg via INTRAVENOUS

## 2021-04-20 MED ORDER — STERILE WATER FOR IRRIGATION IR SOLN
Status: DC | PRN
  Administered 2021-04-20: 120 mL

## 2021-04-20 SURGICAL SUPPLY — 8 items
GOWN CVR UNV OPN BCK APRN NK (MISCELLANEOUS) ×2 IMPLANT
GOWN ISOL THUMB LOOP REG UNIV (MISCELLANEOUS) ×4
KIT PRC NS LF DISP ENDO (KITS) ×1 IMPLANT
KIT PROCEDURE OLYMPUS (KITS) ×2
MANIFOLD NEPTUNE II (INSTRUMENTS) ×2 IMPLANT
SNARE COLD EXACTO (MISCELLANEOUS) ×1 IMPLANT
TRAP ETRAP POLY (MISCELLANEOUS) ×1 IMPLANT
WATER STERILE IRR 250ML POUR (IV SOLUTION) ×2 IMPLANT

## 2021-04-20 NOTE — Transfer of Care (Signed)
Immediate Anesthesia Transfer of Care Note ? ?Patient: Andrew Stevens ? ?Procedure(s) Performed: COLONOSCOPY WITH BIOPSY (Rectum) ?POLYPECTOMY (Rectum) ? ?Patient Location: PACU ? ?Anesthesia Type: General ? ?Level of Consciousness: awake, alert  and patient cooperative ? ?Airway and Oxygen Therapy: Patient Spontanous Breathing and Patient connected to supplemental oxygen ? ?Post-op Assessment: Post-op Vital signs reviewed, Patient's Cardiovascular Status Stable, Respiratory Function Stable, Patent Airway and No signs of Nausea or vomiting ? ?Post-op Vital Signs: Reviewed and stable ? ?Complications: No notable events documented. ? ?

## 2021-04-20 NOTE — Op Note (Signed)
[facility] ?Gastroenterology ?Patient Name: Andrew Stevens ?Procedure Date: 04/20/2021 7:24 AM ?MRN: [id number] ?Account #: [id number] ?Date of Birth: 09-26-1960 ?Admit Type: Outpatient ?Age: 61 ?Room: [facility] OR ROOM 01 ?Gender: Male ?Note Status: Finalized ?Instrument Name: [id number] ?Procedure:             Colonoscopy ?Indications:           High risk colon cancer surveillance: Personal history  ?                       of colonic polyps ?Providers:             Lucilla Lame MD, MD ?Referring MD:          Nino Glow Mclean-Scocuzza MD, MD (Referring MD) ?Medicines:             Propofol per Anesthesia ?Complications:         No immediate complications. ?Procedure:             Pre-Anesthesia Assessment: ?                       - Prior to the procedure, a History and Physical was  ?                       performed, and patient medications and allergies were  ?                       reviewed. The patient's tolerance of previous  ?                       anesthesia was also reviewed. The risks and benefits  ?                       of the procedure and the sedation options and risks  ?                       were discussed with the patient. All questions were  ?                       answered, and informed consent was obtained. Prior  ?                       Anticoagulants: The patient has taken no previous  ?                       anticoagulant or antiplatelet agents. ASA Grade  ?                       Assessment: II - A patient with mild systemic disease.  ?                       After reviewing the risks and benefits, the patient  ?                       was deemed in satisfactory condition to undergo the  ?                       procedure. ?  After obtaining informed consent, the colonoscope was  ?                       passed under direct vision. Throughout the procedure,  ?                       the patient's blood pressure, pulse, and oxygen  ?                       saturations were  monitored continuously. The  ?                       Colonoscope was introduced through the anus and  ?                       advanced to the the cecum, identified by appendiceal  ?                       orifice and ileocecal valve. The colonoscopy was  ?                       performed without difficulty. The patient tolerated  ?                       the procedure well. The quality of the bowel  ?                       preparation was excellent. ?Findings: ?     The perianal and digital rectal examinations were normal. ?     Two sessile polyps were found in the ascending colon. The polyps were 3  ?     to 4 mm in size. These polyps were removed with a cold snare. Resection  ?     and retrieval were complete. ?     Four sessile polyps were found in the transverse colon. The polyps were  ?     3 to 5 mm in size. These polyps were removed with a cold snare.  ?     Resection and retrieval were complete. ?     Two sessile polyps were found in the sigmoid colon. The polyps were 4 to  ?     5 mm in size. These polyps were removed with a cold snare. Resection and  ?     retrieval were complete. ?     Non-bleeding internal hemorrhoids were found during retroflexion. The  ?     hemorrhoids were Grade I (internal hemorrhoids that do not prolapse). ?Impression:            - Two 3 to 4 mm polyps in the ascending colon, removed  ?                       with a cold snare. Resected and retrieved. ?                       - Four 3 to 5 mm polyps in the transverse colon,  ?                       removed with a cold snare. Resected and retrieved. ?                       -  Two 4 to 5 mm polyps in the sigmoid colon, removed  ?                       with a cold snare. Resected and retrieved. ?                       - Non-bleeding internal hemorrhoids. ?Recommendation:        - Discharge patient to home. ?                       - Resume previous diet. ?                       - Continue present medications. ?                       - Await  pathology results. ?                       - Repeat colonoscopy in 3 years for surveillance. ?Procedure Code(s):     --- Professional --- ?                       [phone], Colonoscopy, flexible; with removal of  ?                       tumor(s), polyp(s), or other lesion(s) by snare  ?                       technique ?Diagnosis Code(s):     --- Professional --- ?                       Z86.010, Personal history of colonic polyps ?                       K63.5, Polyp of colon ?CPT copyright 2019 American Medical Association. All rights reserved. ?The codes documented in this report are preliminary and upon coder review may  ?be revised to meet current compliance requirements. ?Lucilla Lame MD, MD ?04/20/2021 8:15:01 AM ?This report has been signed electronically. ?Number of Addenda: 0 ?Note Initiated On: 04/20/2021 7:24 AM ?Scope Withdrawal Time: 0 hours 13 minutes 40 seconds  ?Total Procedure Duration: 0 hours 18 minutes 33 seconds  ?Estimated Blood Loss:  Estimated blood loss: none. ?     [facility] ?

## 2021-04-20 NOTE — Anesthesia Preprocedure Evaluation (Signed)
Anesthesia Evaluation  ?Patient identified by MRN, date of birth, ID band ?Patient awake ? ? ? ?Reviewed: ?Allergy & Precautions, H&P , NPO status , Patient's Chart, lab work & pertinent test results ? ?Airway ?Mallampati: I ? ?TM Distance: >3 FB ?Neck ROM: full ? ? ? Dental ?no notable dental hx. ? ?  ?Pulmonary ?neg pulmonary ROS,  ?  ?Pulmonary exam normal ? ? ? ? ? ? ? Cardiovascular ?hypertension, On Medications ?Normal cardiovascular exam ?Rhythm:regular Rate:Normal ? ? ?  ?Neuro/Psych ? Headaches,   ? GI/Hepatic ?Neg liver ROS, Medicated,  ?Endo/Other  ?diabetes, Well Controlled, Type 2 ? Renal/GU ?  ? ?  ?Musculoskeletal ? ? Abdominal ?  ?Peds ? Hematology ?negative hematology ROS ?(+)   ?Anesthesia Other Findings ? ? Reproductive/Obstetrics ? ?  ? ? ? ? ? ? ? ? ? ? ? ? ? ?  ?  ? ? ? ? ? ? ? ? ?Anesthesia Physical ?Anesthesia Plan ? ?ASA: 2 ? ?Anesthesia Plan: General  ? ?Post-op Pain Management:   ? ?Induction:  ? ?PONV Risk Score and Plan: 2 and TIVA, Propofol infusion and Treatment may vary due to age or medical condition ? ?Airway Management Planned:  ? ?Additional Equipment:  ? ?Intra-op Plan:  ? ?Post-operative Plan:  ? ?Informed Consent: I have reviewed the patients History and Physical, chart, labs and discussed the procedure including the risks, benefits and alternatives for the proposed anesthesia with the patient or authorized representative who has indicated his/her understanding and acceptance.  ? ? ? ? ? ?Plan Discussed with:  ? ?Anesthesia Plan Comments:   ? ? ? ? ? ? ?Anesthesia Quick Evaluation ? ?

## 2021-04-20 NOTE — H&P (Signed)
? ?Lucilla Lame, MD [facility] ?[address] ?[place], [place] [address] ?Phone:[phone] ?Fax : [phone] ? ?Primary Care Physician:  McLean-Scocuzza, Nino Glow, MD ?Primary Gastroenterologist:  Dr. Allen Norris ? ?Pre-Procedure History & Physical: ?HPI:  DIRON HADDON is a 61 y.o. male is here for an colonoscopy. ?  ?Past Medical History:  ?Diagnosis Date  ? Anxiety   ? Diabetes mellitus without complication ([place])   ? Difficult intubation   ? per pt, during kidney stone procedure at [facility]  ? Erectile dysfunction   ? GERD (gastroesophageal reflux disease)   ? occ. takes rolaids or tums if needed  ? Headache   ? hisotry of with brain tumor  ? History of brain tumor   ? History of kidney stones 2019  ? Bilateral  ? History of umbilical hernia   ? Hydronephrosis 2019  ? Moderate right hydronephrosis  ? Hyperlipidemia   ? Hypertension   ? Lipoma of extremity   ? left inner thigh, right groin  ? ? ?Past Surgical History:  ?Procedure Laterality Date  ? BRAIN SURGERY  1989  ? Cyst removed - benign  ? COLONOSCOPY  2015  ? CYSTOSCOPY W/ URETERAL STENT PLACEMENT Right 05/27/2017  ? Procedure: CYSTOSCOPY WITH STENT REPLACEMENT;  Surgeon: [name], MD;  Location: [facility] ORS;  Service: Urology;  Laterality: Right;  ? CYSTOSCOPY WITH STENT PLACEMENT Right 01/23/2018  ? Procedure: CYSTOSCOPY WITH STENT PLACEMENT;  Surgeon: Billey Co, MD;  Location: [facility];  Service: Urology;  Laterality: Right;  ? CYSTOSCOPY WITH STENT PLACEMENT Left 06/20/2020  ? Procedure: CYSTOSCOPY WITH STENT PLACEMENT;  Surgeon: [name], MD;  Location: [facility];  Service: Urology;  Laterality: Left;  ? CYSTOSCOPY/RETROGRADE/URETEROSCOPY Right 01/23/2018  ? Procedure: CYSTOSCOPY/RETROGRADE/URETEROSCOPY;  Surgeon: Billey Co, MD;  Location: [facility];  Service: Urology;  Laterality: Right;  ? CYSTOSCOPY/URETEROSCOPY/HOLMIUM LASER Right 05/27/2017  ? Procedure: CYSTOSCOPY/URETEROSCOPY/HOLMIUM LASER;  Surgeon: [name], MD;   Location: [facility];  Service: Urology;  Laterality: Right;  ? CYSTOSCOPY/URETEROSCOPY/HOLMIUM LASER/STENT PLACEMENT Right 04/11/2017  ? Procedure: CYSTOSCOPY/URETEROSCOPY/HOLMIUM LASER/STENT PLACEMENT;  Surgeon: [name], MD;  Location: [facility];  Service: Urology;  Laterality: Right;  ? CYSTOSCOPY/URETEROSCOPY/HOLMIUM LASER/STENT PLACEMENT Right 06/18/2018  ? Procedure: CYSTOSCOPY/ RETROGRADE//URETEROSCOPY/HOLMIUM LASER/STENT PLACEMENT;  Surgeon: Raynelle Bring, MD;  Location: [facility];  Service: Urology;  Laterality: Right;  ? CYSTOSCOPY/URETEROSCOPY/HOLMIUM LASER/STENT PLACEMENT Right 11/23/2018  ? Procedure: LGXQJJHERD/EYCXKGYJEH PYELOGRAM/URETEROSCOPY/HOLMIUM LASER/STENT PLACEMENT;  Surgeon: Raynelle Bring, MD;  Location: [facility];  Service: Urology;  Laterality: Right;  ? CYSTOSCOPY/URETEROSCOPY/HOLMIUM LASER/STENT PLACEMENT Left 08/10/2020  ? Procedure: CYSTOSCOPY/LEFT URETEROSCOPY/HOLMIUM LASER/STENT PLACEMENT;  Surgeon: Raynelle Bring, MD;  Location: [facility];  Service: Urology;  Laterality: Left;  ? INGUINAL HERNIA REPAIR    ? MASS EXCISION Bilateral 04/22/2017  ? Procedure: EXCISION OF 3  MASSES OF PERIRECTAL, INNER THIGH AREAS;  Surgeon: Johnathan Hausen, MD;  Location: [facility];  Service: General;  Laterality: Bilateral;  LMA  ? ROTATOR CUFF REPAIR Right 2014  ? Dr. Mack Guise  ? VENTRAL HERNIA REPAIR    ? ? ?Prior to Admission medications   ?Medication Sig Start Date End Date Taking? Authorizing Provider  ?amLODipine (NORVASC) 10 MG tablet Take 1 tablet (10 mg total) by mouth every morning. 01/17/21  Yes McLean-Scocuzza, Nino Glow, MD  ?atorvastatin (LIPITOR) 20 MG tablet Take 1 tablet (20 mg total) by mouth at bedtime. 01/17/21  Yes McLean-Scocuzza, Nino Glow, MD  ?chlorthalidone (HYGROTON) 25 MG tablet Take 1 tablet (25 mg  total) by mouth daily. 01/17/21  Yes McLean-Scocuzza, Nino Glow, MD  ?metFORMIN (GLUCOPHAGE) 500 MG tablet TAKE 1 TABLET BY MOUTH 2 (TWO) TIMES DAILY WITH A MEAL. 01/17/21  01/17/22 Yes McLean-Scocuzza, Nino Glow, MD  ?potassium citrate (UROCIT-K) 10 MEQ (1080 MG) SR tablet Take 2 tablets (20 mEq total) by mouth 2 (two) times daily. 01/10/20  Yes McLean-Scocuzza, Nino Glow, MD  ?potassium citrate (UROCIT-K) 10 MEQ (1080 MG) SR tablet TAKE TWO TABLETS BY MOUTH TWICE DAILY 08/18/20 08/18/21 Yes   ?potassium citrate (UROCIT-K) 10 MEQ (1080 MG) SR tablet TAKE THREE TABLETS BY MOUTH TWICE DAILY 04/18/21  Yes   ?sildenafil (VIAGRA) 100 MG tablet 1 tablet PO as directed PRN 09/19/20  Yes   ?telmisartan (MICARDIS) 80 MG tablet TAKE 1 TABLET (80 MG TOTAL) BY MOUTH DAILY. 01/17/21  Yes McLean-Scocuzza, Nino Glow, MD  ?COVID-19 mRNA bivalent vaccine, Pfizer, (PFIZER COVID-19 VAC BIVALENT) injection Inject into the muscle. 02/02/21   Carlyle Basques, MD  ?tamsulosin (FLOMAX) 0.4 MG CAPS capsule Take 1 capsule (0.4 mg total) by mouth daily after breakfast. ?Patient not taking: Reported on 01/17/2021 06/20/20   [name], MD  ? ? ?Allergies as of 02/27/2021  ? (No Known Allergies)  ? ? ?Family History  ?Problem Relation Age of Onset  ? Hypertension Mother   ? Rheum arthritis Mother   ? Alcohol abuse Brother   ? Heart attack Sister   ? Early death Sister   ?     During childbirth  ? Thyroid disease Sister   ? Colon cancer Neg Hx   ? Prostate cancer Neg Hx   ? ? ?Social History  ? ?Socioeconomic History  ? Marital status: Married  ?  Spouse name: Not on file  ? Number of children: 2  ? Years of education: Not on file  ? Highest education level: Not on file  ?Occupational History  ? Occupation: OR at [employer]  ?Tobacco Use  ? Smoking status: Never  ? Smokeless tobacco: Never  ?Vaping Use  ? Vaping Use: Never used  ?Substance and Sexual Activity  ? Alcohol use: Yes  ?  Alcohol/week: 0.0 standard drinks  ?  Comment: Occasional  ? Drug use: No  ? Sexual activity: Yes  ?  Comment: wife   ?Other Topics Concern  ? Not on file  ?Social History Narrative  ? Lives in [place] with wife. Dog. Hobbies - Football.  ?    ? 3 grandchildren, 2 children, married to wife x 47 years as of 01/08/19  ? From [facility]  ?   ? Regular Exercise -  NO  ? Daily Caffeine Use:  4 cups coffee in AM, 1 cup tea in AM  ?   ?   ? ?Social Determinants of Health  ? ?Financial Resource Strain: Not on file  ?Food Insecurity: Not on file  ?Transportation Needs: Not on file  ?Physical Activity: Not on file  ?Stress: Not on file  ?Social Connections: Not on file  ?Intimate Partner Violence: Not on file  ? ? ?Review of Systems: ?See HPI, otherwise negative ROS ? ?Physical Exam: ?BP (!) 149/89   Pulse 97   Temp 98.1 ?F (36.7 ?C) (Temporal)   Ht 5' 8.6" (1.742 m)   Wt 87.1 kg   SpO2 98%   BMI 28.69 kg/m?  ?General:   Alert,  pleasant and cooperative in NAD ?Head:  Normocephalic and atraumatic. ?Neck:  Supple; no masses or thyromegaly. ?Lungs:  Clear throughout  to auscultation.    ?Heart:  Regular rate and rhythm. ?Abdomen:  Soft, nontender and nondistended. Normal bowel sounds, without guarding, and without rebound.   ?Neurologic:  Alert and  oriented x4;  grossly normal neurologically. ? ?Impression/Plan: ?REYNALDO ROSSMAN is here for an colonoscopy to be performed for a history of adenomatous polyps on 2015 ? ? ?Risks, benefits, limitations, and alternatives regarding  colonoscopy have been reviewed with the patient.  Questions have been answered.  All parties agreeable. ? ? ?Lucilla Lame, MD  04/20/2021, 7:39 AM ?

## 2021-04-20 NOTE — Anesthesia Procedure Notes (Signed)
Date/Time: 04/20/2021 7:51 AM ?Performed by: Cameron Ali, CRNA ?Pre-anesthesia Checklist: Patient identified, Emergency Drugs available, Suction available, Timeout performed and Patient being monitored ?Patient Re-evaluated:Patient Re-evaluated prior to induction ?Oxygen Delivery Method: Nasal cannula ?Placement Confirmation: positive ETCO2 ? ? ? ? ?

## 2021-04-20 NOTE — Anesthesia Postprocedure Evaluation (Signed)
Anesthesia Post Note ? ?Patient: Andrew Stevens ? ?Procedure(s) Performed: COLONOSCOPY WITH BIOPSY (Rectum) ?POLYPECTOMY (Rectum) ? ? ?  ?Patient location during evaluation: PACU ?Anesthesia Type: General ?Level of consciousness: awake and alert ?Pain management: pain level controlled ?Vital Signs Assessment: post-procedure vital signs reviewed and stable ?Respiratory status: spontaneous breathing ?Cardiovascular status: stable ?Anesthetic complications: no ? ? ?No notable events documented. ? ?Virl Axe,  Amarie Tarte D ? ? ? ? ? ?

## 2021-04-23 ENCOUNTER — Other Ambulatory Visit: Payer: Self-pay

## 2021-04-23 LAB — SURGICAL PATHOLOGY

## 2021-04-24 ENCOUNTER — Encounter: Payer: Self-pay | Admitting: Gastroenterology

## 2021-04-30 ENCOUNTER — Encounter: Payer: Self-pay | Admitting: Gastroenterology

## 2021-05-03 ENCOUNTER — Other Ambulatory Visit: Payer: Self-pay

## 2021-05-04 ENCOUNTER — Encounter (HOSPITAL_COMMUNITY): Payer: Self-pay

## 2021-05-04 ENCOUNTER — Ambulatory Visit (HOSPITAL_COMMUNITY): Payer: No Typology Code available for payment source

## 2021-05-09 ENCOUNTER — Other Ambulatory Visit: Payer: Self-pay

## 2021-05-10 ENCOUNTER — Ambulatory Visit (HOSPITAL_COMMUNITY)
Admission: RE | Admit: 2021-05-10 | Discharge: 2021-05-10 | Disposition: A | Discharge status: Home / Self Care | Payer: No Typology Code available for payment source | Source: Ambulatory Visit | Attending: Urology | Admitting: Urology

## 2021-05-10 DIAGNOSIS — N202 Calculus of kidney with calculus of ureter: Secondary | ICD-10-CM | POA: Insufficient documentation

## 2021-05-10 DIAGNOSIS — N135 Crossing vessel and stricture of ureter without hydronephrosis: Secondary | ICD-10-CM | POA: Diagnosis present

## 2021-05-10 MED ORDER — FUROSEMIDE 10 MG/ML IJ SOLN
INTRAMUSCULAR | Status: AC
Start: 2021-05-10 — End: 2021-05-10
  Administered 2021-05-10: 40 mg via INTRAVENOUS
  Filled 2021-05-10: qty 4

## 2021-05-10 MED ORDER — TECHNETIUM TC 99M MERTIATIDE
4.9000 | Freq: Once | INTRAVENOUS | Status: AC | PRN
  Administered 2021-05-10: 4.9 via INTRAVENOUS

## 2021-05-10 MED ORDER — FUROSEMIDE 10 MG/ML IJ SOLN: 40.0000 mg | Freq: Once | INTRAMUSCULAR | Status: AC

## 2021-05-11 ENCOUNTER — Other Ambulatory Visit: Payer: Self-pay

## 2021-05-24 ENCOUNTER — Encounter: Payer: Self-pay | Admitting: Internal Medicine

## 2021-05-29 NOTE — Addendum Note (Signed)
Encounter addended by: Tania Ade on: 05/29/2021 10:04 AM ? Actions taken: Imaging Exam ended

## 2021-06-12 ENCOUNTER — Other Ambulatory Visit: Payer: Self-pay

## 2021-06-26 ENCOUNTER — Other Ambulatory Visit: Payer: Self-pay

## 2021-06-29 ENCOUNTER — Other Ambulatory Visit: Payer: Self-pay

## 2021-07-15 ENCOUNTER — Other Ambulatory Visit: Payer: Self-pay

## 2021-07-16 ENCOUNTER — Other Ambulatory Visit: Payer: Self-pay

## 2021-07-30 ENCOUNTER — Other Ambulatory Visit: Payer: Self-pay

## 2021-08-03 ENCOUNTER — Other Ambulatory Visit: Payer: Self-pay

## 2021-09-03 ENCOUNTER — Other Ambulatory Visit: Payer: Self-pay

## 2021-09-04 ENCOUNTER — Other Ambulatory Visit: Payer: Self-pay

## 2021-10-02 ENCOUNTER — Other Ambulatory Visit: Payer: Self-pay

## 2021-10-14 ENCOUNTER — Other Ambulatory Visit: Payer: Self-pay

## 2021-10-15 ENCOUNTER — Other Ambulatory Visit: Payer: Self-pay

## 2021-10-29 ENCOUNTER — Other Ambulatory Visit: Payer: Self-pay

## 2021-11-01 ENCOUNTER — Other Ambulatory Visit: Payer: Self-pay

## 2021-11-19 ENCOUNTER — Other Ambulatory Visit: Payer: Self-pay

## 2021-12-10 ENCOUNTER — Other Ambulatory Visit: Payer: Self-pay

## 2021-12-10 MED ORDER — COVID-19 MRNA VAC-TRIS(PFIZER) 30 MCG/0.3ML IM SUSY
PREFILLED_SYRINGE | INTRAMUSCULAR | 0 refills | Status: DC
  Filled 2021-12-14: qty 0.3, 1d supply, fill #0

## 2021-12-11 ENCOUNTER — Other Ambulatory Visit: Payer: Self-pay

## 2021-12-14 ENCOUNTER — Other Ambulatory Visit: Payer: Self-pay

## 2022-01-07 ENCOUNTER — Other Ambulatory Visit: Payer: Self-pay

## 2022-01-13 NOTE — Progress Notes (Signed)
SUBJECTIVE:   Chief Complaint  Patient presents with   Establish Care    Transfer of Care   HPI Patient presents to clinic to transfer care.  No acute concerns  Diabetes type 2 Asymptomatic.  Currently taking metformin 500 mg twice a day and tolerating well.  Does not check CBGs at home.  Hypertension Asymptomatic.  Currently taking amlodipine 10 mg daily, chlorthalidone 25 mg daily and telmisartan 80 mg daily.  Denies any headaches, visual changes, chest pain, shortness of breath or lower extremity edema.   Hyperlipidemia Taking atorvastatin 20 mg daily and tolerating medication well.   PERTINENT PMH / PSH: Hypertension Diabetes type 2 Hyperlipidemia Nephrolithiasis BPH Ureteral strictures Colon polyps ED  OBJECTIVE:  BP 118/76   Pulse 95   Temp 97.9 F (36.6 C)   Ht 5' 8.6" (1.742 m)   Wt 208 lb 9.6 oz (94.6 kg)   SpO2 98%   BMI 31.17 kg/m    Physical Exam Vitals reviewed.  Constitutional:      General: He is not in acute distress.    Appearance: He is normal weight. He is not ill-appearing.  HENT:     Head: Normocephalic.  Eyes:     Conjunctiva/sclera: Conjunctivae normal.  Cardiovascular:     Rate and Rhythm: Normal rate and regular rhythm.     Pulses: Normal pulses.     Heart sounds: Normal heart sounds.  Pulmonary:     Effort: Pulmonary effort is normal.     Breath sounds: Normal breath sounds.  Abdominal:     General: Bowel sounds are normal.     Palpations: Abdomen is soft.     Tenderness: There is no abdominal tenderness.  Musculoskeletal:        General: Normal range of motion.     Cervical back: Normal range of motion and neck supple.     Right lower leg: No edema.     Left lower leg: No edema.  Lymphadenopathy:     Cervical: No cervical adenopathy.  Neurological:     Mental Status: He is alert. Mental status is at baseline.  Psychiatric:        Mood and Affect: Mood normal.        Behavior: Behavior normal.        Thought  Content: Thought content normal.        Judgment: Judgment normal.     ASSESSMENT/PLAN:  Hypertension associated with diabetes (HCC) Assessment & Plan: Initially elevated.  Repeat blood pressure within normal limits.  Controlled with current medications. Continue amlodipine 10 mg at night Continue telmisartan 80 mg at night Continue chlorthalidone 25 mg daily Monitor blood pressure at home.  Goal less than 120/80.   Orders: -     CBC with Differential/Platelet; Future -     Comprehensive metabolic panel; Future  Controlled type 2 diabetes mellitus without complication, without long-term current use of insulin (HCC) Assessment & Plan: Last A1c 6.2.  Currently asymptomatic.  Tolerating medication well. Continue metformin 500 mg twice daily Eye exam up-to-date Foot exam at next visit   Orders: -     Hemoglobin A1c; Future -     Vitamin B12; Future -     Microalbumin / creatinine urine ratio; Future  Vitamin D deficiency -     VITAMIN D 25 Hydroxy (Vit-D Deficiency, Fractures); Future  Nephrolithiasis Assessment & Plan: Takes potassium citrate 30 mEq twice daily.  Follows with urology.   Benign prostatic hyperplasia without lower urinary tract  symptoms Assessment & Plan: Chronic.  Asymptomatic. Follows with urology.   Erectile dysfunction, unspecified erectile dysfunction type Assessment & Plan: Currently on Viagra as needed Follows with urology.   Hyperlipidemia, unspecified hyperlipidemia type Assessment & Plan: On statin.  No myalgias. Continue atorvastatin 20 mg daily   Prostate cancer screening -     PSA; Future    PDMP reviewed  Return in about 9 days (around 01/23/2022) for PCP, annual physical..  Dana Allan, MD

## 2022-01-14 ENCOUNTER — Ambulatory Visit (INDEPENDENT_AMBULATORY_CARE_PROVIDER_SITE_OTHER): Payer: No Typology Code available for payment source | Admitting: Family Medicine

## 2022-01-14 ENCOUNTER — Encounter: Payer: Self-pay | Admitting: Family Medicine

## 2022-01-14 VITALS — BP 118/76 | HR 95 | Temp 97.9°F | Ht 68.6 in | Wt 208.6 lb

## 2022-01-14 DIAGNOSIS — N529 Male erectile dysfunction, unspecified: Secondary | ICD-10-CM

## 2022-01-14 DIAGNOSIS — E1159 Type 2 diabetes mellitus with other circulatory complications: Secondary | ICD-10-CM

## 2022-01-14 DIAGNOSIS — E119 Type 2 diabetes mellitus without complications: Secondary | ICD-10-CM

## 2022-01-14 DIAGNOSIS — I152 Hypertension secondary to endocrine disorders: Secondary | ICD-10-CM

## 2022-01-14 DIAGNOSIS — Z125 Encounter for screening for malignant neoplasm of prostate: Secondary | ICD-10-CM

## 2022-01-14 DIAGNOSIS — E559 Vitamin D deficiency, unspecified: Secondary | ICD-10-CM

## 2022-01-14 DIAGNOSIS — E785 Hyperlipidemia, unspecified: Secondary | ICD-10-CM

## 2022-01-14 DIAGNOSIS — N400 Enlarged prostate without lower urinary tract symptoms: Secondary | ICD-10-CM

## 2022-01-14 DIAGNOSIS — N200 Calculus of kidney: Secondary | ICD-10-CM

## 2022-01-14 NOTE — Assessment & Plan Note (Signed)
Last A1c 6.2.  Currently asymptomatic.  Tolerating medication well. Continue metformin 500 mg twice daily Eye exam up-to-date Foot exam at next visit

## 2022-01-14 NOTE — Patient Instructions (Addendum)
It was a pleasure meeting you today. Thank you for allowing me to take part in your health care.  Our goals for today as we discussed include:  For your blood pressure Recommend you start taking amlodipine and telmisartan at night blood pressure control.  Recommend starting this tomorrow night 12/19. Continue amlodipine 10 mg at night Continue telmisartan 80 mg at night Continue chlorthalidone 25 mg during the day Monitor blood pressures at home.  Goal less than 120/80.   For your diabetes Continue metformin 500 mg twice daily Will check A1c Foot exam due at next visit Eye exam due March 2024  We will get some labs.  If they are abnormal or we need to do something about them, I will call you.  If they are normal, I will send you a message on MyChart (if it is active) or a letter in the mail.  If you don't hear from [place] in 2 weeks, please call the office at the number below.   Please fast 12 hours prior to your blood work and have them completed at least 1 week before your next visit.  If you have any questions or concerns, please do not hesitate to call the office at [phone].  I look forward to our next visit and until then take care and stay safe.  Regards,   Carollee Leitz, MD   [facility]

## 2022-01-14 NOTE — Assessment & Plan Note (Signed)
Takes potassium citrate 30 mEq twice daily.  Follows with urology.

## 2022-01-14 NOTE — Assessment & Plan Note (Signed)
Initially elevated.  Repeat blood pressure within normal limits.  Controlled with current medications. Continue amlodipine 10 mg at night Continue telmisartan 80 mg at night Continue chlorthalidone 25 mg daily Monitor blood pressure at home.  Goal less than 120/80.

## 2022-01-14 NOTE — Assessment & Plan Note (Signed)
On statin.  No myalgias. Continue atorvastatin 20 mg daily

## 2022-01-14 NOTE — Assessment & Plan Note (Signed)
Currently on Viagra as needed Follows with urology.

## 2022-01-14 NOTE — Assessment & Plan Note (Signed)
Chronic.  Asymptomatic. Follows with urology.

## 2022-01-15 ENCOUNTER — Other Ambulatory Visit
Admission: RE | Admit: 2022-01-15 | Discharge: 2022-01-15 | Disposition: A | Discharge status: Home / Self Care | Payer: No Typology Code available for payment source | Attending: Family Medicine | Admitting: Family Medicine

## 2022-01-15 DIAGNOSIS — E559 Vitamin D deficiency, unspecified: Secondary | ICD-10-CM | POA: Diagnosis present

## 2022-01-15 DIAGNOSIS — I152 Hypertension secondary to endocrine disorders: Secondary | ICD-10-CM | POA: Insufficient documentation

## 2022-01-15 DIAGNOSIS — E119 Type 2 diabetes mellitus without complications: Secondary | ICD-10-CM | POA: Insufficient documentation

## 2022-01-15 DIAGNOSIS — Z125 Encounter for screening for malignant neoplasm of prostate: Secondary | ICD-10-CM | POA: Insufficient documentation

## 2022-01-15 DIAGNOSIS — E1159 Type 2 diabetes mellitus with other circulatory complications: Secondary | ICD-10-CM | POA: Diagnosis present

## 2022-01-15 LAB — COMPREHENSIVE METABOLIC PANEL WITH GFR
ALT: 19 U/L (ref 0–44)
AST: 19 U/L (ref 15–41)
Albumin: 4.5 g/dL (ref 3.5–5.0)
Alkaline Phosphatase: 68 U/L (ref 38–126)
Anion gap: 7 (ref 5–15)
BUN: 17 mg/dL (ref 8–23)
CO2: 29 mmol/L (ref 22–32)
Calcium: 9.3 mg/dL (ref 8.9–10.3)
Chloride: 105 mmol/L (ref 98–111)
Creatinine, Ser: 1.07 mg/dL (ref 0.61–1.24)
GFR, Estimated: >60 mL/min
Glucose, Bld: 128 mg/dL — ABNORMAL HIGH (ref 70–99)
Potassium: 4.1 mmol/L (ref 3.5–5.1)
Sodium: 141 mmol/L (ref 135–145)
Total Bilirubin: 1.4 mg/dL — ABNORMAL HIGH (ref 0.3–1.2)
Total Protein: 8 g/dL (ref 6.5–8.1)

## 2022-01-15 LAB — CBC WITH DIFFERENTIAL/PLATELET
Abs Immature Granulocytes: 0.03 10*3/uL (ref 0.00–0.07)
Basophils Absolute: 0.1 10*3/uL (ref 0.0–0.1)
Basophils Relative: 1 %
Eosinophils Absolute: 0.1 10*3/uL (ref 0.0–0.5)
Eosinophils Relative: 1 %
HCT: 46.4 % (ref 39.0–52.0)
Hemoglobin: 15.7 g/dL (ref 13.0–17.0)
Immature Granulocytes: 0 %
Lymphocytes Relative: 13 %
Lymphs Abs: 1 10*3/uL (ref 0.7–4.0)
MCH: 29.6 pg (ref 26.0–34.0)
MCHC: 33.8 g/dL (ref 30.0–36.0)
MCV: 87.5 fL (ref 80.0–100.0)
Monocytes Absolute: 0.5 10*3/uL (ref 0.1–1.0)
Monocytes Relative: 6 %
Neutro Abs: 6.5 10*3/uL (ref 1.7–7.7)
Neutrophils Relative %: 79 %
Platelets: 323 10*3/uL (ref 150–400)
RBC: 5.3 MIL/uL (ref 4.22–5.81)
RDW: 13 % (ref 11.5–15.5)
WBC: 8.2 10*3/uL (ref 4.0–10.5)
nRBC: 0 % (ref 0.0–0.2)

## 2022-01-15 LAB — VITAMIN D 25 HYDROXY (VIT D DEFICIENCY, FRACTURES): Vit D, 25-Hydroxy: 31.93 ng/mL (ref 30–100)

## 2022-01-15 LAB — VITAMIN B12: Vitamin B-12: 235 pg/mL (ref 180–914)

## 2022-01-15 LAB — PSA: Prostatic Specific Antigen: 7.62 ng/mL — ABNORMAL HIGH (ref 0.00–4.00)

## 2022-01-16 ENCOUNTER — Telehealth: Payer: Self-pay

## 2022-01-16 LAB — MICROALBUMIN / CREATININE URINE RATIO
Creatinine, Urine: 147.9 mg/dL
Microalb Creat Ratio: 14 mg/g creat (ref 0–29)
Microalb, Ur: 20.9 ug/mL — ABNORMAL HIGH

## 2022-01-16 LAB — HEMOGLOBIN A1C
Hgb A1c MFr Bld: 6.9 % — ABNORMAL HIGH (ref 4.8–5.6)
Mean Plasma Glucose: 151 mg/dL

## 2022-01-16 NOTE — Telephone Encounter (Signed)
Left message to call office back regarding lab results

## 2022-01-23 ENCOUNTER — Ambulatory Visit (INDEPENDENT_AMBULATORY_CARE_PROVIDER_SITE_OTHER): Payer: No Typology Code available for payment source | Admitting: Family Medicine

## 2022-01-23 ENCOUNTER — Encounter: Payer: Self-pay | Admitting: Family Medicine

## 2022-01-23 ENCOUNTER — Telehealth: Payer: Self-pay | Admitting: Family Medicine

## 2022-01-23 ENCOUNTER — Other Ambulatory Visit: Payer: Self-pay

## 2022-01-23 VITALS — BP 124/76 | HR 71 | Temp 98.3°F | Ht 68.6 in | Wt 200.6 lb

## 2022-01-23 DIAGNOSIS — R972 Elevated prostate specific antigen [PSA]: Secondary | ICD-10-CM | POA: Diagnosis not present

## 2022-01-23 DIAGNOSIS — E1159 Type 2 diabetes mellitus with other circulatory complications: Secondary | ICD-10-CM

## 2022-01-23 DIAGNOSIS — E119 Type 2 diabetes mellitus without complications: Secondary | ICD-10-CM

## 2022-01-23 DIAGNOSIS — I739 Peripheral vascular disease, unspecified: Secondary | ICD-10-CM

## 2022-01-23 LAB — PSA: PSA: 5.76 ng/mL — ABNORMAL HIGH (ref 0.10–4.00)

## 2022-01-23 MED ORDER — METFORMIN HCL 500 MG PO TABS
1000.0000 mg | ORAL_TABLET | Freq: Two times a day (BID) | ORAL | 3 refills | Status: DC
  Filled 2022-01-23 – 2022-04-21 (×2): qty 180, 45d supply, fill #0
  Filled 2022-07-21: qty 180, 45d supply, fill #1
  Filled 2022-12-29: qty 180, 45d supply, fill #2

## 2022-01-23 NOTE — Progress Notes (Signed)
   SUBJECTIVE:   Chief Complaint  Patient presents with  . Annual Exam   HPI Patient presents clinic for annual physical exam  No acute concerns  Reviewed recent lab work with patient  Diabetes type 2 A1c elevated from previous check.  Patient reports noncompliancy with diet.  Denies any symptoms.  Compliant with metformin.   PSA elevated Denies any symptoms of urinary obstruction, hesitancy, dysuria, hematuria or nocturia.    PERTINENT PMH / PSH: Diabetes type 2 Hypertension BPH  OBJECTIVE:  BP 124/76   Pulse 71   Temp 98.3 F (36.8 C)   Ht 5' 8.6" (1.742 m)   Wt 200 lb 9.6 oz (91 kg)   SpO2 98%   BMI 29.97 kg/m    Physical Exam Vitals reviewed.  HENT:     Head: Normocephalic.     Right Ear: Tympanic membrane, ear canal and external ear normal.     Left Ear: Tympanic membrane, ear canal and external ear normal.     Nose: Nose normal.     Mouth/Throat:     Mouth: Mucous membranes are moist.  Eyes:     Conjunctiva/sclera: Conjunctivae normal.     Pupils: Pupils are equal, round, and reactive to light.  Neck:     Thyroid: No thyroid mass, thyromegaly or thyroid tenderness.     Vascular: No carotid bruit.  Cardiovascular:     Rate and Rhythm: Normal rate and regular rhythm.     Pulses: Normal pulses.     Heart sounds: Normal heart sounds.  Pulmonary:     Effort: Pulmonary effort is normal.     Breath sounds: Normal breath sounds.  Abdominal:     General: Abdomen is flat. Bowel sounds are normal.     Palpations: Abdomen is soft.  Musculoskeletal:        General: Normal range of motion.     Cervical back: Normal range of motion and neck supple.     Right lower leg: No edema.     Left lower leg: No edema.  Lymphadenopathy:     Cervical: No cervical adenopathy.  Neurological:     Mental Status: He is alert.  Psychiatric:        Mood and Affect: Mood normal.        Behavior: Behavior normal.        Thought Content: Thought content normal.         Judgment: Judgment normal.    ASSESSMENT/PLAN:  Controlled type 2 diabetes mellitus without complication, without long-term current use of insulin (HCC) Assessment & Plan: Recent A1c increased from 6.2 to 6.9.  Currently asymptomatic.   Increased metformin 1000 mg twice daily Eye exam up-to-date Foot exam completed   Orders: -     metFORMIN HCl; Take 2 tablets (1,000 mg total) by mouth 2 (two) times daily with a meal.  Dispense: 180 tablet; Refill: 3  Hypertension associated with diabetes ([place])  PVD (peripheral vascular disease) ([place]) -     US ARTERIAL LOWER EXTREMITY DUPLEX BILATERAL; Future  Elevated PSA measurement Assessment & Plan: Denies any BPH or urinary symptoms. PSA elevated at 7.62 Will repeat PSA today Recommend patient follow-up with his urologist  Orders: -     PSA   PDMP reviewed  Return in about 3 months (around 04/24/2022) for PCP.  Carollee Leitz, MD

## 2022-01-23 NOTE — Patient Instructions (Signed)
It was a pleasure meeting you today. Thank you for allowing me to take part in your health care.  Our goals for today as we discussed include:  For your diabetes Increase metformin to 500 mg 2 tablets 2 times a day   Your prostate enzyme was elevated.  Will repeat today.  I will reach out to your urologist and confirm if needing to follow-up with him.  For your blood pressure Was slightly elevated today.  Your repeat blood pressure was 124/76 Monitor your blood pressure at home. If continues to remain greater than 130/80 will refer to hypertension clinic.  Schedule follow-up for 3 months  If you have any questions or concerns, please do not hesitate to call the office at [phone].  I look forward to our next visit and until then take care and stay safe.  Regards,   Carollee Leitz, MD   [facility]

## 2022-01-23 NOTE — Telephone Encounter (Signed)
Lft pt vm to call ofc to sch [facility]. thanks ?

## 2022-01-25 ENCOUNTER — Ambulatory Visit
Admission: RE | Admit: 2022-01-25 | Discharge: 2022-01-25 | Disposition: A | Discharge status: Home / Self Care | Payer: No Typology Code available for payment source | Source: Ambulatory Visit | Attending: Family Medicine | Admitting: Family Medicine

## 2022-01-25 DIAGNOSIS — I739 Peripheral vascular disease, unspecified: Secondary | ICD-10-CM | POA: Insufficient documentation

## 2022-01-28 ENCOUNTER — Encounter: Payer: Self-pay | Admitting: Family Medicine

## 2022-01-29 ENCOUNTER — Telehealth: Payer: Self-pay

## 2022-01-29 NOTE — Telephone Encounter (Signed)
Left message to call the office regarding lab results.

## 2022-02-03 DIAGNOSIS — I739 Peripheral vascular disease, unspecified: Secondary | ICD-10-CM | POA: Insufficient documentation

## 2022-02-03 DIAGNOSIS — R972 Elevated prostate specific antigen [PSA]: Secondary | ICD-10-CM | POA: Insufficient documentation

## 2022-02-03 NOTE — Assessment & Plan Note (Addendum)
Recent A1c increased from 6.2 to 6.9.  Currently asymptomatic.   Increased metformin 1000 mg twice daily Eye exam up-to-date Foot exam completed

## 2022-02-03 NOTE — Assessment & Plan Note (Signed)
Denies any BPH or urinary symptoms. PSA elevated at 7.62 Will repeat PSA today Recommend patient follow-up with his urologist

## 2022-02-04 ENCOUNTER — Encounter: Payer: Self-pay | Admitting: Family Medicine

## 2022-02-04 NOTE — Assessment & Plan Note (Addendum)
Chronic. Bilateral feet cool and pale with diminished pulses on palpation. Will send for vascular studies

## 2022-02-11 ENCOUNTER — Other Ambulatory Visit: Payer: Self-pay

## 2022-02-12 ENCOUNTER — Other Ambulatory Visit: Payer: Self-pay

## 2022-02-12 ENCOUNTER — Other Ambulatory Visit: Payer: Self-pay | Admitting: Family Medicine

## 2022-02-12 DIAGNOSIS — I10 Essential (primary) hypertension: Secondary | ICD-10-CM

## 2022-02-13 ENCOUNTER — Other Ambulatory Visit: Payer: Self-pay

## 2022-02-13 MED FILL — Telmisartan Tab 80 MG: ORAL | 90 days supply | Qty: 90 | Fill #0 | Status: AC

## 2022-02-15 ENCOUNTER — Encounter: Payer: No Typology Code available for payment source | Admitting: Internal Medicine

## 2022-02-27 ENCOUNTER — Other Ambulatory Visit: Payer: Self-pay | Admitting: Physician Assistant

## 2022-02-27 ENCOUNTER — Ambulatory Visit
Admission: RE | Admit: 2022-02-27 | Discharge: 2022-02-27 | Disposition: A | Discharge status: Home / Self Care | Payer: PRIVATE HEALTH INSURANCE | Source: Ambulatory Visit | Attending: Physician Assistant | Admitting: Physician Assistant

## 2022-02-27 ENCOUNTER — Ambulatory Visit
Admission: RE | Admit: 2022-02-27 | Discharge: 2022-02-27 | Disposition: A | Discharge status: Home / Self Care | Payer: PRIVATE HEALTH INSURANCE | Attending: Physician Assistant | Admitting: Physician Assistant

## 2022-02-27 DIAGNOSIS — M1712 Unilateral primary osteoarthritis, left knee: Secondary | ICD-10-CM | POA: Insufficient documentation

## 2022-02-27 DIAGNOSIS — Y990 Civilian activity done for income or pay: Secondary | ICD-10-CM | POA: Diagnosis not present

## 2022-03-03 ENCOUNTER — Other Ambulatory Visit: Payer: Self-pay | Admitting: Family Medicine

## 2022-03-03 ENCOUNTER — Other Ambulatory Visit: Payer: Self-pay

## 2022-03-03 DIAGNOSIS — I10 Essential (primary) hypertension: Secondary | ICD-10-CM

## 2022-03-04 ENCOUNTER — Other Ambulatory Visit: Payer: Self-pay

## 2022-03-04 ENCOUNTER — Telehealth: Payer: Self-pay | Admitting: Family Medicine

## 2022-03-04 MED FILL — Amlodipine Besylate Tab 10 MG (Base Equivalent): ORAL | 90 days supply | Qty: 90 | Fill #0 | Status: AC

## 2022-03-04 NOTE — Telephone Encounter (Signed)
LM for pt to cb 

## 2022-03-05 NOTE — Telephone Encounter (Signed)
Patient's wife states he has a Urology appointment in April 2024.

## 2022-03-05 NOTE — Telephone Encounter (Signed)
Thank you :)

## 2022-03-08 ENCOUNTER — Other Ambulatory Visit: Payer: Self-pay

## 2022-04-02 ENCOUNTER — Other Ambulatory Visit (HOSPITAL_COMMUNITY): Payer: Self-pay | Admitting: Urology

## 2022-04-02 DIAGNOSIS — N135 Crossing vessel and stricture of ureter without hydronephrosis: Secondary | ICD-10-CM

## 2022-04-19 ENCOUNTER — Encounter (HOSPITAL_COMMUNITY)
Admission: RE | Admit: 2022-04-19 | Discharge: 2022-04-19 | Disposition: A | Discharge status: Home / Self Care | Payer: 59 | Source: Ambulatory Visit | Attending: Urology | Admitting: Urology

## 2022-04-19 DIAGNOSIS — N1330 Unspecified hydronephrosis: Secondary | ICD-10-CM | POA: Diagnosis not present

## 2022-04-19 DIAGNOSIS — N135 Crossing vessel and stricture of ureter without hydronephrosis: Secondary | ICD-10-CM | POA: Diagnosis not present

## 2022-04-19 DIAGNOSIS — N261 Atrophy of kidney (terminal): Secondary | ICD-10-CM | POA: Diagnosis not present

## 2022-04-19 MED ORDER — FUROSEMIDE 10 MG/ML IJ SOLN
INTRAMUSCULAR | Status: AC
  Filled 2022-04-19: qty 8

## 2022-04-19 MED ORDER — FUROSEMIDE 10 MG/ML IJ SOLN: 45.0000 mg | Freq: Once | INTRAMUSCULAR | Status: DC

## 2022-04-19 MED ORDER — TECHNETIUM TC 99M MERTIATIDE
5.2000 | Freq: Once | INTRAVENOUS | Status: AC
  Administered 2022-04-19: 5.2 via INTRAVENOUS

## 2022-04-21 ENCOUNTER — Other Ambulatory Visit: Payer: Self-pay | Admitting: Family Medicine

## 2022-04-21 ENCOUNTER — Other Ambulatory Visit: Payer: Self-pay

## 2022-04-21 DIAGNOSIS — I10 Essential (primary) hypertension: Secondary | ICD-10-CM

## 2022-04-22 MED FILL — Chlorthalidone Tab 25 MG: ORAL | 90 days supply | Qty: 90 | Fill #0 | Status: AC

## 2022-04-23 ENCOUNTER — Other Ambulatory Visit: Payer: Self-pay

## 2022-05-09 ENCOUNTER — Other Ambulatory Visit: Payer: Self-pay

## 2022-05-09 MED ORDER — POTASSIUM CITRATE ER 10 MEQ (1080 MG) PO TBCR
30.0000 meq | EXTENDED_RELEASE_TABLET | Freq: Two times a day (BID) | ORAL | 5 refills | Status: DC
  Filled 2022-05-09: qty 180, 30d supply, fill #0
  Filled 2022-06-12: qty 180, 30d supply, fill #1
  Filled 2022-07-22: qty 180, 30d supply, fill #2
  Filled 2022-09-01: qty 180, 30d supply, fill #3
  Filled 2022-11-03: qty 180, 30d supply, fill #4
  Filled 2022-12-29: qty 180, 30d supply, fill #5

## 2022-05-10 DIAGNOSIS — R972 Elevated prostate specific antigen [PSA]: Secondary | ICD-10-CM | POA: Diagnosis not present

## 2022-05-17 ENCOUNTER — Other Ambulatory Visit: Payer: Self-pay

## 2022-05-17 DIAGNOSIS — N135 Crossing vessel and stricture of ureter without hydronephrosis: Secondary | ICD-10-CM | POA: Diagnosis not present

## 2022-05-17 DIAGNOSIS — N5201 Erectile dysfunction due to arterial insufficiency: Secondary | ICD-10-CM | POA: Diagnosis not present

## 2022-05-17 DIAGNOSIS — N200 Calculus of kidney: Secondary | ICD-10-CM | POA: Diagnosis not present

## 2022-05-17 DIAGNOSIS — R972 Elevated prostate specific antigen [PSA]: Secondary | ICD-10-CM | POA: Diagnosis not present

## 2022-05-17 LAB — PSA: PSA: 1.61

## 2022-05-17 MED ORDER — SILDENAFIL CITRATE 100 MG PO TABS
100.0000 mg | ORAL_TABLET | ORAL | 11 refills | Status: AC
  Filled 2022-05-17: qty 6, 30d supply, fill #0
  Filled 2022-07-27: qty 6, 30d supply, fill #1

## 2022-05-17 MED ORDER — POTASSIUM CITRATE ER 10 MEQ (1080 MG) PO TBCR
30.0000 meq | EXTENDED_RELEASE_TABLET | Freq: Two times a day (BID) | ORAL | 11 refills | Status: DC
  Filled 2022-05-17: qty 180, 30d supply, fill #0

## 2022-05-24 ENCOUNTER — Encounter: Payer: Self-pay | Admitting: Family Medicine

## 2022-05-28 ENCOUNTER — Ambulatory Visit: Payer: 59 | Admitting: Family Medicine

## 2022-05-29 ENCOUNTER — Ambulatory Visit: Payer: 59 | Admitting: Family Medicine

## 2022-06-04 ENCOUNTER — Ambulatory Visit: Payer: 59 | Admitting: Family Medicine

## 2022-06-05 ENCOUNTER — Other Ambulatory Visit: Payer: Self-pay

## 2022-06-05 ENCOUNTER — Other Ambulatory Visit: Payer: Self-pay | Admitting: Family Medicine

## 2022-06-05 MED FILL — Telmisartan Tab 80 MG: ORAL | 90 days supply | Qty: 90 | Fill #1 | Status: AC

## 2022-06-06 ENCOUNTER — Other Ambulatory Visit: Payer: Self-pay

## 2022-06-06 MED FILL — Atorvastatin Calcium Tab 20 MG (Base Equivalent): ORAL | 90 days supply | Qty: 90 | Fill #0 | Status: AC

## 2022-06-07 ENCOUNTER — Other Ambulatory Visit: Payer: Self-pay

## 2022-06-12 MED FILL — Amlodipine Besylate Tab 10 MG (Base Equivalent): ORAL | 90 days supply | Qty: 90 | Fill #1 | Status: AC

## 2022-06-13 ENCOUNTER — Other Ambulatory Visit: Payer: Self-pay

## 2022-06-27 ENCOUNTER — Ambulatory Visit (INDEPENDENT_AMBULATORY_CARE_PROVIDER_SITE_OTHER): Payer: 59 | Admitting: Family Medicine

## 2022-06-27 ENCOUNTER — Encounter: Payer: Self-pay | Admitting: Family Medicine

## 2022-06-27 VITALS — BP 124/72 | HR 72 | Ht 68.5 in | Wt 194.0 lb

## 2022-06-27 DIAGNOSIS — R972 Elevated prostate specific antigen [PSA]: Secondary | ICD-10-CM

## 2022-06-27 DIAGNOSIS — I152 Hypertension secondary to endocrine disorders: Secondary | ICD-10-CM

## 2022-06-27 DIAGNOSIS — E119 Type 2 diabetes mellitus without complications: Secondary | ICD-10-CM

## 2022-06-27 DIAGNOSIS — I259 Chronic ischemic heart disease, unspecified: Secondary | ICD-10-CM

## 2022-06-27 DIAGNOSIS — Z7984 Long term (current) use of oral hypoglycemic drugs: Secondary | ICD-10-CM

## 2022-06-27 DIAGNOSIS — E1159 Type 2 diabetes mellitus with other circulatory complications: Secondary | ICD-10-CM | POA: Diagnosis not present

## 2022-06-27 DIAGNOSIS — E118 Type 2 diabetes mellitus with unspecified complications: Secondary | ICD-10-CM

## 2022-06-27 DIAGNOSIS — E785 Hyperlipidemia, unspecified: Secondary | ICD-10-CM | POA: Diagnosis not present

## 2022-06-27 DIAGNOSIS — I739 Peripheral vascular disease, unspecified: Secondary | ICD-10-CM

## 2022-06-27 DIAGNOSIS — E1169 Type 2 diabetes mellitus with other specified complication: Secondary | ICD-10-CM | POA: Diagnosis not present

## 2022-06-27 NOTE — Progress Notes (Signed)
SUBJECTIVE:   Chief Complaint  Patient presents with   Medical Management of Chronic Issues   HPI Patient presents clinic for chronic disease management  No acute concerns  Reviewed recent lab work with patient  Diabetes type 2 Asymptomatic.  Has recently changed diet and now eating healthier.  Has portion control.  Eating more fruits and vegetables.  Increased walking daily.  Compliant with metformin 500 mg 2 tabs twice daily.  On statin and ARB therapy.  Hypertension Asymptomatic.  Compliant with current medications.  Takes Micardis 80 mg, chlorthalidone 25 mg daily and amlodipine 10 mg daily.  Also takes potassium citrate 30 mEq twice daily.  Blood pressures remain less than 130/80 at home.  Denies any headaches, visual changes, chest pain, shortness of breath or lower extremity edema.  Hyperlipidemia On statin therapy and tolerating well.  Denies any myalgias or joint pain.  Requesting calcium scan today.   PSA elevated Has been evaluated by Urology.  PSA has since returned to normal.   Denies any symptoms of urinary obstruction, hesitancy, dysuria, hematuria or nocturia.    PERTINENT PMH / PSH: Diabetes type 2 Hypertension HLD BPH  OBJECTIVE:  BP 124/72   Pulse 72   Ht 5' 8.5" (1.74 m)   Wt 194 lb (88 kg)   SpO2 97%   BMI 29.07 kg/m    Physical Exam Vitals reviewed.  HENT:     Head: Normocephalic.     Right Ear: Tympanic membrane, ear canal and external ear normal.     Left Ear: Tympanic membrane, ear canal and external ear normal.     Nose: Nose normal.     Mouth/Throat:     Mouth: Mucous membranes are moist.  Eyes:     Conjunctiva/sclera: Conjunctivae normal.     Pupils: Pupils are equal, round, and reactive to light.  Neck:     Thyroid: No thyroid mass, thyromegaly or thyroid tenderness.     Vascular: No carotid bruit.  Cardiovascular:     Rate and Rhythm: Normal rate and regular rhythm.     Pulses: Normal pulses.     Heart sounds: Normal heart  sounds.  Pulmonary:     Effort: Pulmonary effort is normal.     Breath sounds: Normal breath sounds.  Abdominal:     General: Abdomen is flat. Bowel sounds are normal.     Palpations: Abdomen is soft.  Musculoskeletal:        General: Normal range of motion.     Cervical back: Normal range of motion and neck supple.     Right lower leg: No edema.     Left lower leg: No edema.  Lymphadenopathy:     Cervical: No cervical adenopathy.  Neurological:     Mental Status: He is alert.  Psychiatric:        Mood and Affect: Mood normal.        Behavior: Behavior normal.        Thought Content: Thought content normal.        Judgment: Judgment normal.     ASSESSMENT/PLAN:  Type 2 diabetes mellitus with complications (HCC) Assessment & Plan: Chronic.  Recent A1c increased from 6.2 to 6.9.   Continue Metformin 1000 mg twice daily Continue statin and ARB therapy Eye exam due.  Patient to schedule appointment Foot exam completed Follow-up in 6 months    Orders: -     Hemoglobin A1c; Future  Hypertension associated with diabetes [facility]) Assessment & Plan: Chronic.  Stable.  Well-controlled on current blood pressure medications. Continue amlodipine 10 mg at night Continue telmisartan 80 mg at night Continue chlorthalidone 25 mg daily Monitor blood pressure at home.  Goal less than 30/80. Recent creatinine within normal limits. Follow-up in 6 months.   Orders: -     Comprehensive metabolic panel; Future  Ischemic heart disease -     CT CARDIAC SCORING (SELF PAY ONLY); Future  Hyperlipidemia associated with type 2 diabetes mellitus (HCC) Assessment & Plan: On statin.  No myalgias. Continue atorvastatin 20 mg daily Requesting calcium score.  Discussed with patient that despite results given current risk factors would still recommend statin therapy and lifestyle changes.  Patient wanted to continue with cardiac CT.  Orders: -     Lipid panel; Future  Elevated PSA  measurement Assessment & Plan: PSA previously elevated at 7.62 Has followed up with urology and levels has since returned to normal.  No further workup required.     PDMP reviewed  Return in about 6 months (around 12/28/2022).  Dana Allan, MD

## 2022-06-27 NOTE — Patient Instructions (Addendum)
It was a pleasure meeting you today. Thank you for allowing me to take part in your health care.  Our goals for today as we discussed include:  Please have A1c lab work completed after June 19  Calcium score/ Cardiac CT.  This is a 3D image of the heart that measures the calcium deposits in the coronary arteries to determine risk of heart disease. It is a $99 self-pay exam . The imaging is done at [facility].   Follow up in 6 months   If you have any questions or concerns, please do not hesitate to call the office at [phone].  I look forward to our next visit and until then take care and stay safe.  Regards,   Dana Allan, MD   [facility]

## 2022-07-01 NOTE — Assessment & Plan Note (Addendum)
Chronic.  Stable.  Well-controlled on current blood pressure medications. Continue amlodipine 10 mg at night Continue telmisartan 80 mg at night Continue chlorthalidone 25 mg daily Monitor blood pressure at home.  Goal less than 30/80. Recent creatinine within normal limits. Follow-up in 6 months.

## 2022-07-01 NOTE — Assessment & Plan Note (Addendum)
Chronic.  Recent A1c increased from 6.2 to 6.9.   Continue Metformin 1000 mg twice daily Continue statin and ARB therapy Eye exam due.  Patient to schedule appointment Foot exam completed Follow-up in 6 months

## 2022-07-05 DIAGNOSIS — I259 Chronic ischemic heart disease, unspecified: Secondary | ICD-10-CM | POA: Insufficient documentation

## 2022-07-05 NOTE — Assessment & Plan Note (Signed)
On statin.  No myalgias. Continue atorvastatin 20 mg daily Requesting calcium score.  Discussed with patient that despite results given current risk factors would still recommend statin therapy and lifestyle changes.  Patient wanted to continue with cardiac CT.

## 2022-07-05 NOTE — Assessment & Plan Note (Signed)
PSA previously elevated at 7.62 Has followed up with urology and levels has since returned to normal.  No further workup required.

## 2022-07-12 ENCOUNTER — Ambulatory Visit
Admission: RE | Admit: 2022-07-12 | Discharge: 2022-07-12 | Disposition: A | Discharge status: Home / Self Care | Payer: 59 | Source: Ambulatory Visit | Attending: Family Medicine | Admitting: Family Medicine

## 2022-07-12 DIAGNOSIS — I259 Chronic ischemic heart disease, unspecified: Secondary | ICD-10-CM | POA: Insufficient documentation

## 2022-07-16 ENCOUNTER — Other Ambulatory Visit: Payer: Self-pay | Admitting: Family Medicine

## 2022-07-16 ENCOUNTER — Encounter: Payer: Self-pay | Admitting: Family Medicine

## 2022-07-16 MED ORDER — POTASSIUM CITRATE ER 10 MEQ (1080 MG) PO TBCR: 30.0000 meq | EXTENDED_RELEASE_TABLET | Freq: Two times a day (BID) | ORAL | 11 refills | Status: DC

## 2022-07-16 MED ORDER — ASPIRIN 81 MG PO TBEC: 81.0000 mg | DELAYED_RELEASE_TABLET | Freq: Every day | ORAL | 12 refills | Status: DC

## 2022-07-21 ENCOUNTER — Other Ambulatory Visit: Payer: Self-pay

## 2022-07-21 MED FILL — Chlorthalidone Tab 25 MG: ORAL | 90 days supply | Qty: 90 | Fill #1 | Status: AC

## 2022-07-21 MED FILL — Amlodipine Besylate Tab 10 MG (Base Equivalent): ORAL | 90 days supply | Qty: 90 | Fill #2 | Status: CN

## 2022-07-22 ENCOUNTER — Other Ambulatory Visit: Payer: Self-pay

## 2022-07-24 ENCOUNTER — Other Ambulatory Visit
Admission: RE | Admit: 2022-07-24 | Discharge: 2022-07-24 | Disposition: A | Discharge status: Home / Self Care | Payer: 59 | Attending: Family Medicine | Admitting: Family Medicine

## 2022-07-24 DIAGNOSIS — E1169 Type 2 diabetes mellitus with other specified complication: Secondary | ICD-10-CM | POA: Diagnosis not present

## 2022-07-24 DIAGNOSIS — E118 Type 2 diabetes mellitus with unspecified complications: Secondary | ICD-10-CM | POA: Insufficient documentation

## 2022-07-24 DIAGNOSIS — E785 Hyperlipidemia, unspecified: Secondary | ICD-10-CM | POA: Diagnosis not present

## 2022-07-24 DIAGNOSIS — I152 Hypertension secondary to endocrine disorders: Secondary | ICD-10-CM | POA: Diagnosis not present

## 2022-07-24 DIAGNOSIS — E1159 Type 2 diabetes mellitus with other circulatory complications: Secondary | ICD-10-CM | POA: Insufficient documentation

## 2022-07-24 LAB — HEMOGLOBIN A1C
Hgb A1c MFr Bld: 6.5 % — ABNORMAL HIGH (ref 4.8–5.6)
Mean Plasma Glucose: 140 mg/dL

## 2022-07-24 LAB — LIPID PANEL
Cholesterol: 121 mg/dL (ref 0–200)
HDL: 50 mg/dL (ref >40)
LDL Cholesterol: 65 mg/dL (ref 0–99)
Total CHOL/HDL Ratio: 2.4 RATIO
Triglycerides: 30 mg/dL (ref <150)
VLDL: 6 mg/dL (ref 0–40)

## 2022-07-24 LAB — COMPREHENSIVE METABOLIC PANEL
ALT: 16 U/L (ref 0–44)
AST: 18 U/L (ref 15–41)
Albumin: 4 g/dL (ref 3.5–5.0)
Alkaline Phosphatase: 53 U/L (ref 38–126)
Anion gap: 8 (ref 5–15)
BUN: 19 mg/dL (ref 8–23)
CO2: 27 mmol/L (ref 22–32)
Calcium: 8.9 mg/dL (ref 8.9–10.3)
Chloride: 102 mmol/L (ref 98–111)
Creatinine, Ser: 1.01 mg/dL (ref 0.61–1.24)
GFR, Estimated: >60 mL/min (ref >60)
Glucose, Bld: 143 mg/dL — ABNORMAL HIGH (ref 70–99)
Potassium: 3.7 mmol/L (ref 3.5–5.1)
Sodium: 137 mmol/L (ref 135–145)
Total Bilirubin: 0.9 mg/dL (ref 0.3–1.2)
Total Protein: 7.3 g/dL (ref 6.5–8.1)

## 2022-07-24 NOTE — Addendum Note (Signed)
Addended by: Christy Gentles on: 07/24/2022 07:21 AM   Modules accepted: Orders

## 2022-07-24 NOTE — Addendum Note (Signed)
Addended by: Dineen Conradt N on: 07/24/2022 07:21 AM   Modules accepted: Orders  

## 2022-07-24 NOTE — Addendum Note (Signed)
Addended by: Areesha Dehaven N on: 07/24/2022 07:21 AM   Modules accepted: Orders  

## 2022-09-14 MED FILL — Amlodipine Besylate Tab 10 MG (Base Equivalent): ORAL | 90 days supply | Qty: 90 | Fill #2 | Status: AC

## 2022-10-01 ENCOUNTER — Other Ambulatory Visit: Payer: Self-pay

## 2022-10-08 MED FILL — Telmisartan Tab 80 MG: ORAL | 90 days supply | Qty: 90 | Fill #2 | Status: AC

## 2022-11-03 MED FILL — Chlorthalidone Tab 25 MG: ORAL | 90 days supply | Qty: 90 | Fill #2 | Status: AC

## 2022-12-25 MED FILL — Amlodipine Besylate Tab 10 MG (Base Equivalent): ORAL | 90 days supply | Qty: 90 | Fill #3 | Status: AC

## 2022-12-25 MED FILL — Atorvastatin Calcium Tab 20 MG (Base Equivalent): ORAL | 90 days supply | Qty: 90 | Fill #1 | Status: AC

## 2022-12-29 ENCOUNTER — Other Ambulatory Visit: Payer: Self-pay

## 2022-12-29 MED FILL — Chlorthalidone Tab 25 MG: ORAL | 90 days supply | Qty: 90 | Fill #3 | Status: CN

## 2022-12-29 MED FILL — Telmisartan Tab 80 MG: ORAL | 90 days supply | Qty: 90 | Fill #3 | Status: AC

## 2022-12-30 ENCOUNTER — Other Ambulatory Visit: Payer: Self-pay

## 2022-12-31 ENCOUNTER — Other Ambulatory Visit: Payer: Self-pay

## 2023-01-20 ENCOUNTER — Encounter: Payer: Self-pay | Admitting: Family Medicine

## 2023-01-20 NOTE — Telephone Encounter (Signed)
 Care team updated and letter sent for eye exam notes.

## 2023-01-23 DIAGNOSIS — H2513 Age-related nuclear cataract, bilateral: Secondary | ICD-10-CM | POA: Diagnosis not present

## 2023-01-23 DIAGNOSIS — E119 Type 2 diabetes mellitus without complications: Secondary | ICD-10-CM | POA: Diagnosis not present

## 2023-01-23 LAB — HM DIABETES EYE EXAM

## 2023-02-08 ENCOUNTER — Other Ambulatory Visit: Payer: Self-pay

## 2023-02-08 MED FILL — Chlorthalidone Tab 25 MG: ORAL | 90 days supply | Qty: 90 | Fill #3 | Status: AC

## 2023-02-09 ENCOUNTER — Other Ambulatory Visit: Payer: Self-pay

## 2023-02-10 ENCOUNTER — Other Ambulatory Visit: Payer: Self-pay

## 2023-02-10 MED ORDER — POTASSIUM CITRATE ER 10 MEQ (1080 MG) PO TBCR
30.0000 meq | EXTENDED_RELEASE_TABLET | Freq: Two times a day (BID) | ORAL | 5 refills | Status: DC
  Filled 2023-02-10: qty 180, 30d supply, fill #0
  Filled 2023-03-20: qty 180, 30d supply, fill #1
  Filled 2023-04-18: qty 180, 30d supply, fill #2
  Filled 2023-06-23: qty 180, 30d supply, fill #3
  Filled 2023-08-04: qty 180, 30d supply, fill #4
  Filled 2023-10-07: qty 180, 30d supply, fill #5

## 2023-03-20 ENCOUNTER — Other Ambulatory Visit: Payer: Self-pay

## 2023-03-24 ENCOUNTER — Encounter: Payer: Self-pay | Admitting: Family Medicine

## 2023-04-18 ENCOUNTER — Other Ambulatory Visit: Payer: Self-pay | Admitting: Family Medicine

## 2023-04-18 DIAGNOSIS — I10 Essential (primary) hypertension: Secondary | ICD-10-CM

## 2023-04-22 ENCOUNTER — Other Ambulatory Visit: Payer: Self-pay | Admitting: Family Medicine

## 2023-04-22 ENCOUNTER — Other Ambulatory Visit: Payer: Self-pay

## 2023-04-22 DIAGNOSIS — I10 Essential (primary) hypertension: Secondary | ICD-10-CM

## 2023-04-22 MED FILL — Amlodipine Besylate Tab 10 MG (Base Equivalent): ORAL | 30 days supply | Qty: 30 | Fill #0 | Status: AC

## 2023-04-22 MED FILL — Telmisartan Tab 80 MG: ORAL | 30 days supply | Qty: 30 | Fill #0 | Status: AC

## 2023-05-03 ENCOUNTER — Other Ambulatory Visit: Payer: Self-pay | Admitting: Family Medicine

## 2023-05-03 DIAGNOSIS — E119 Type 2 diabetes mellitus without complications: Secondary | ICD-10-CM

## 2023-05-05 ENCOUNTER — Other Ambulatory Visit: Payer: Self-pay | Admitting: Family Medicine

## 2023-05-05 ENCOUNTER — Other Ambulatory Visit: Payer: Self-pay

## 2023-05-05 DIAGNOSIS — E119 Type 2 diabetes mellitus without complications: Secondary | ICD-10-CM

## 2023-05-06 ENCOUNTER — Other Ambulatory Visit: Payer: Self-pay

## 2023-05-07 ENCOUNTER — Other Ambulatory Visit: Payer: Self-pay

## 2023-05-08 ENCOUNTER — Other Ambulatory Visit: Payer: Self-pay

## 2023-05-09 ENCOUNTER — Other Ambulatory Visit: Payer: Self-pay

## 2023-05-09 MED FILL — Metformin HCl Tab 500 MG: ORAL | 90 days supply | Qty: 360 | Fill #0 | Status: AC

## 2023-05-09 NOTE — Telephone Encounter (Signed)
 Left message for patient to give our office a call back to get scheduled for a follow up appointment.  OK for E2C2 to get her scheduled for an appointment if patient calls back. If scheduled, please notify office.

## 2023-05-22 DIAGNOSIS — N200 Calculus of kidney: Secondary | ICD-10-CM | POA: Diagnosis not present

## 2023-05-22 DIAGNOSIS — N135 Crossing vessel and stricture of ureter without hydronephrosis: Secondary | ICD-10-CM | POA: Diagnosis not present

## 2023-05-22 DIAGNOSIS — N5201 Erectile dysfunction due to arterial insufficiency: Secondary | ICD-10-CM | POA: Diagnosis not present

## 2023-05-22 DIAGNOSIS — R972 Elevated prostate specific antigen [PSA]: Secondary | ICD-10-CM | POA: Diagnosis not present

## 2023-05-23 ENCOUNTER — Other Ambulatory Visit: Payer: Self-pay

## 2023-05-23 ENCOUNTER — Ambulatory Visit (INDEPENDENT_AMBULATORY_CARE_PROVIDER_SITE_OTHER): Admitting: Family Medicine

## 2023-05-23 ENCOUNTER — Encounter: Payer: Self-pay | Admitting: Family Medicine

## 2023-05-23 VITALS — BP 120/76 | HR 82 | Temp 98.3°F | Ht 68.5 in | Wt 195.0 lb

## 2023-05-23 DIAGNOSIS — Z23 Encounter for immunization: Secondary | ICD-10-CM | POA: Diagnosis not present

## 2023-05-23 DIAGNOSIS — E538 Deficiency of other specified B group vitamins: Secondary | ICD-10-CM | POA: Diagnosis not present

## 2023-05-23 DIAGNOSIS — E1159 Type 2 diabetes mellitus with other circulatory complications: Secondary | ICD-10-CM | POA: Diagnosis not present

## 2023-05-23 DIAGNOSIS — R972 Elevated prostate specific antigen [PSA]: Secondary | ICD-10-CM

## 2023-05-23 DIAGNOSIS — E559 Vitamin D deficiency, unspecified: Secondary | ICD-10-CM | POA: Diagnosis not present

## 2023-05-23 DIAGNOSIS — Z114 Encounter for screening for human immunodeficiency virus [HIV]: Secondary | ICD-10-CM

## 2023-05-23 DIAGNOSIS — E118 Type 2 diabetes mellitus with unspecified complications: Secondary | ICD-10-CM

## 2023-05-23 DIAGNOSIS — I152 Hypertension secondary to endocrine disorders: Secondary | ICD-10-CM | POA: Diagnosis not present

## 2023-05-23 DIAGNOSIS — E785 Hyperlipidemia, unspecified: Secondary | ICD-10-CM | POA: Diagnosis not present

## 2023-05-23 DIAGNOSIS — Z7984 Long term (current) use of oral hypoglycemic drugs: Secondary | ICD-10-CM

## 2023-05-23 DIAGNOSIS — E1169 Type 2 diabetes mellitus with other specified complication: Secondary | ICD-10-CM | POA: Diagnosis not present

## 2023-05-23 DIAGNOSIS — I10 Essential (primary) hypertension: Secondary | ICD-10-CM

## 2023-05-23 LAB — POCT GLYCOSYLATED HEMOGLOBIN (HGB A1C): Hemoglobin A1C: 6.4 % — AB (ref 4.0–5.6)

## 2023-05-23 MED ORDER — ATORVASTATIN CALCIUM 20 MG PO TABS
20.0000 mg | ORAL_TABLET | Freq: Every day | ORAL | 3 refills | Status: DC
Start: 2023-05-23 — End: 2023-09-24
  Filled 2023-05-23: qty 90, 90d supply, fill #0

## 2023-05-23 MED ORDER — AMLODIPINE BESYLATE 10 MG PO TABS
10.0000 mg | ORAL_TABLET | ORAL | 0 refills | Status: DC
Start: 2023-05-23 — End: 2023-06-23
  Filled 2023-05-23: qty 30, 30d supply, fill #0

## 2023-05-23 MED ORDER — TELMISARTAN 80 MG PO TABS
80.0000 mg | ORAL_TABLET | Freq: Every day | ORAL | 0 refills | Status: DC
Start: 2023-05-23 — End: 2023-06-23
  Filled 2023-05-23: qty 30, 30d supply, fill #0

## 2023-05-23 MED ORDER — ASPIRIN 81 MG PO TBEC: 81.0000 mg | DELAYED_RELEASE_TABLET | Freq: Every day | ORAL | Status: DC

## 2023-05-23 MED ORDER — METFORMIN HCL 500 MG PO TABS
1000.0000 mg | ORAL_TABLET | Freq: Two times a day (BID) | ORAL | 3 refills | Status: DC
Start: 2023-05-23 — End: 2023-09-24
  Filled 2023-05-23: qty 360, 90d supply, fill #0

## 2023-05-23 MED ORDER — CHLORTHALIDONE 25 MG PO TABS
25.0000 mg | ORAL_TABLET | Freq: Every day | ORAL | 3 refills | Status: DC
Start: 2023-05-23 — End: 2023-09-24
  Filled 2023-05-23: qty 90, 90d supply, fill #0
  Filled 2023-08-04: qty 90, 90d supply, fill #1

## 2023-05-23 NOTE — Progress Notes (Unsigned)
 SUBJECTIVE:   Chief Complaint  Patient presents with   Medical Management of Chronic Issues   HPI Presents for follow up chronic disease management  Discussed the use of AI scribe software for clinical note transcription with the patient, who gave verbal consent to proceed.  History of Present Illness Andrew Stevens is a 63 year old male with diabetes and hypertension who presents for a follow-up visit.  His diabetes management shows improvement with a current A1c of 6.4, down from 6.5 and previously 6.9. He is on metformin , refilled every 90 days, and requires a refill for the upcoming year.  He has been under the care of his urologist for follow-up. His PSA levels and recent rectal exam are normal. He had concerns about previous high PSA levels, possibly related to kidney stones, but recent x-rays showed no issues. He takes potassium citrate , three tablets twice a day, as prescribed.  For hypertension, he is on chlorthalidone  25 mg, amlodipine , and Micardis , and requires a refill for chlorthalidone . He experiences palpitations, which he attributes to anxiety. No chest pain or shortness of breath.  His weight is currently 195 pounds, down from 209 pounds. He is actively managing his weight through diet and exercise, including treadmill walking. He notes challenges with weight loss, attributing a plateau to loose skin, and is working on toning through physical activity.    PERTINENT PMH / PSH: As above  OBJECTIVE:  BP 120/76   Pulse 82   Temp 98.3 F (36.8 C) (Oral)   Ht 5' 8.5" (1.74 m)   Wt 195 lb (88.5 kg)   SpO2 97%   BMI 29.22 kg/m    Physical Exam Vitals reviewed.  Constitutional:      General: He is not in acute distress.    Appearance: Normal appearance. He is not ill-appearing, toxic-appearing or diaphoretic.  Eyes:     General:        Right eye: No discharge.        Left eye: No discharge.  Cardiovascular:     Rate and Rhythm: Normal rate and regular  rhythm.     Heart sounds: Normal heart sounds.  Pulmonary:     Effort: Pulmonary effort is normal.     Breath sounds: Normal breath sounds.  Abdominal:     General: Bowel sounds are normal.  Musculoskeletal:        General: Normal range of motion.     Cervical back: Normal range of motion.  Skin:    General: Skin is warm and dry.  Neurological:     Mental Status: He is alert and oriented to person, place, and time. Mental status is at baseline.  Psychiatric:        Mood and Affect: Mood normal.        Behavior: Behavior normal.        Thought Content: Thought content normal.        Judgment: Judgment normal.    {Perform Simple Foot Exam  Perform Detailed exam:1} {Insert foot Exam (Optional):30965}      06/27/2022    2:34 PM 01/23/2022    9:23 AM 01/14/2022   10:29 AM 01/17/2021    8:36 AM 01/14/2020    8:10 AM  Depression screen PHQ 2/9  Decreased Interest 0 0 0 0 0  Down, Depressed, Hopeless 0 0 0 0 0  PHQ - 2 Score 0 0 0 0 0  Altered sleeping 0 0 0  0  Tired, decreased energy 0 0  0  0  Change in appetite 1 0 0  0  Feeling bad or failure about yourself  0 0 0  0  Trouble concentrating 0 0 0  0  Moving slowly or fidgety/restless 0 0 0  0  Suicidal thoughts 0 0 0  0  PHQ-9 Score 1 0 0  0  Difficult doing work/chores Not difficult at all Not difficult at all Not difficult at all  Not difficult at all      06/27/2022    2:34 PM 01/23/2022    9:23 AM 01/14/2022   10:29 AM 01/14/2020    8:11 AM  GAD 7 : Generalized Anxiety Score  Nervous, Anxious, on Edge 0 0 0 0  Control/stop worrying 0 0 0 0  Worry too much - different things 0 0 0 0  Trouble relaxing 0 0 0 0  Restless 0 0 0 0  Easily annoyed or irritable 0 0 0 0  Afraid - awful might happen 0 0 0 0  Total GAD 7 Score 0 0 0 0  Anxiety Difficulty Not difficult at all Not difficult at all Not difficult at all Not difficult at all    ASSESSMENT/PLAN:  Type 2 diabetes mellitus with complications  [facility]) Assessment & Plan: Chronic.  Recent A1c 6.4  Obesity management is ongoing. Weight loss plateau despite diet and exercise efforts. - Encourage continued dietary discipline and exercise. - Discuss strategies to overcome weight loss plateau, including muscle toning exercises. Refill Metformin  1000 mg twice daily Continue statin and ARB therapy Eye exam due.  Patient to schedule appointment Foot exam completed     Orders: -     POCT glycosylated hemoglobin (Hb A1C) -     metFORMIN  HCl; Take 2 tablets (1,000 mg total) by mouth 2 (two) times daily with a meal.  Dispense: 360 tablet; Refill: 3 -     Microalbumin / creatinine urine ratio; Future  Hyperlipidemia associated with type 2 diabetes mellitus (HCC) Assessment & Plan: On statin.  No myalgias. Refill atorvastatin  20 mg daily   Orders: -     Atorvastatin  Calcium ; Take 1 tablet (20 mg total) by mouth at bedtime.  Dispense: 90 tablet; Refill: 3 -     Lipid panel; Future  Hypertension associated with diabetes [facility]) Assessment & Plan: Chronic.  Stable.  Well-controlled on current blood pressure medications. Refill amlodipine  10 mg at night Refill telmisartan  80 mg at night Refill chlorthalidone  25 mg daily    Orders: -     amLODIPine  Besylate; Take 1 tablet (10 mg total) by mouth every morning.  Dispense: 30 tablet; Refill: 0 -     Aspirin ; Take 1 tablet (81 mg total) by mouth daily. Swallow whole. -     Chlorthalidone ; Take 1 tablet (25 mg total) by mouth daily.  Dispense: 90 tablet; Refill: 3 -     Telmisartan ; TAKE 1 TABLET (80 MG TOTAL) BY MOUTH DAILY.  Dispense: 30 tablet; Refill: 0 -     Comprehensive metabolic panel with GFR; Future -     CBC with Differential/Platelet; Future  Vitamin D  deficiency Assessment & Plan: Check Vitamin D  level  Orders: -     VITAMIN D  25 Hydroxy (Vit-D Deficiency, Fractures); Future  Vitamin B 12 deficiency Assessment & Plan: Check Vitamin B 12 level  Orders: -     Vitamin  B12; Future  Need for pneumococcal 20-valent conjugate vaccination -     Pneumococcal conjugate vaccine 20-valent  Encounter for screening for  HIV -     HIV Antibody (routine testing w rflx); Future  Elevated PSA measurement Assessment & Plan: Normal Following with urology     PDMP reviewed  Return if symptoms worsen or fail to improve, for PCP.  Valli Gaw, MD

## 2023-05-23 NOTE — Patient Instructions (Addendum)
 It was a pleasure meeting you today. Thank you for allowing me to take part in your health care.  Our goals for today as we discussed include:  Refills sent for requested medications  Pneumonia 20 vaccine today.  No further vaccines needed.   This is a list of the screening recommended for you and due dates:  Health Maintenance  Topic Date Due   HIV Screening  Never done   Pneumococcal Vaccination (2 of 2 - PCV) 01/15/2017   COVID-19 Vaccine (6 - 2024-25 season) 09/29/2022   Yearly kidney health urinalysis for diabetes  01/16/2023   Complete foot exam   01/24/2023   Yearly kidney function blood test for diabetes  07/24/2023   Flu Shot  08/29/2023   Hemoglobin A1C  11/22/2023   Eye exam for diabetics  01/23/2024   Colon Cancer Screening  04/20/2024   DTaP/Tdap/Td vaccine (3 - Td or Tdap) 03/15/2027   Hepatitis C Screening  Completed   Zoster (Shingles) Vaccine  Completed   HPV Vaccine  Aged Out   Meningitis B Vaccine  Aged Out      If you have any questions or concerns, please do not hesitate to call the office at [phone].  I look forward to our next visit and until then take care and stay safe.  Regards,   Valli Gaw, MD   [facility]

## 2023-05-30 ENCOUNTER — Other Ambulatory Visit
Admission: RE | Admit: 2023-05-30 | Discharge: 2023-05-30 | Disposition: A | Discharge status: Home / Self Care | Attending: Family Medicine | Admitting: Family Medicine

## 2023-05-30 DIAGNOSIS — E559 Vitamin D deficiency, unspecified: Secondary | ICD-10-CM | POA: Insufficient documentation

## 2023-05-30 DIAGNOSIS — E785 Hyperlipidemia, unspecified: Secondary | ICD-10-CM | POA: Diagnosis not present

## 2023-05-30 DIAGNOSIS — I152 Hypertension secondary to endocrine disorders: Secondary | ICD-10-CM | POA: Insufficient documentation

## 2023-05-30 DIAGNOSIS — E538 Deficiency of other specified B group vitamins: Secondary | ICD-10-CM | POA: Insufficient documentation

## 2023-05-30 DIAGNOSIS — E1159 Type 2 diabetes mellitus with other circulatory complications: Secondary | ICD-10-CM | POA: Diagnosis not present

## 2023-05-30 DIAGNOSIS — Z114 Encounter for screening for human immunodeficiency virus [HIV]: Secondary | ICD-10-CM

## 2023-05-30 DIAGNOSIS — E1169 Type 2 diabetes mellitus with other specified complication: Secondary | ICD-10-CM | POA: Insufficient documentation

## 2023-05-30 DIAGNOSIS — E118 Type 2 diabetes mellitus with unspecified complications: Secondary | ICD-10-CM | POA: Insufficient documentation

## 2023-05-30 LAB — COMPREHENSIVE METABOLIC PANEL WITH GFR
ALT: 21 U/L (ref 0–44)
AST: 27 U/L (ref 15–41)
Albumin: 4.1 g/dL (ref 3.5–5.0)
Alkaline Phosphatase: 47 U/L (ref 38–126)
Anion gap: 11 (ref 5–15)
BUN: 22 mg/dL (ref 8–23)
CO2: 24 mmol/L (ref 22–32)
Calcium: 9.2 mg/dL (ref 8.9–10.3)
Chloride: 103 mmol/L (ref 98–111)
Creatinine, Ser: 1.06 mg/dL (ref 0.61–1.24)
GFR, Estimated: >60 mL/min (ref >60)
Glucose, Bld: 143 mg/dL — ABNORMAL HIGH (ref 70–99)
Potassium: 4 mmol/L (ref 3.5–5.1)
Sodium: 138 mmol/L (ref 135–145)
Total Bilirubin: 1 mg/dL (ref 0.0–1.2)
Total Protein: 7.4 g/dL (ref 6.5–8.1)

## 2023-05-30 LAB — CBC WITH DIFFERENTIAL/PLATELET
Abs Immature Granulocytes: 0.03 10*3/uL (ref 0.00–0.07)
Basophils Absolute: 0.1 10*3/uL (ref 0.0–0.1)
Basophils Relative: 1 %
Eosinophils Absolute: 0.2 10*3/uL (ref 0.0–0.5)
Eosinophils Relative: 3 %
HCT: 44.4 % (ref 39.0–52.0)
Hemoglobin: 15.3 g/dL (ref 13.0–17.0)
Immature Granulocytes: 0 %
Lymphocytes Relative: 18 %
Lymphs Abs: 1.3 10*3/uL (ref 0.7–4.0)
MCH: 29.9 pg (ref 26.0–34.0)
MCHC: 34.5 g/dL (ref 30.0–36.0)
MCV: 86.9 fL (ref 80.0–100.0)
Monocytes Absolute: 0.4 10*3/uL (ref 0.1–1.0)
Monocytes Relative: 6 %
Neutro Abs: 5 10*3/uL (ref 1.7–7.7)
Neutrophils Relative %: 72 %
Platelets: 328 10*3/uL (ref 150–400)
RBC: 5.11 MIL/uL (ref 4.22–5.81)
RDW: 12.4 % (ref 11.5–15.5)
Smear Review: NORMAL
WBC: 6.9 10*3/uL (ref 4.0–10.5)
nRBC: 0 % (ref 0.0–0.2)

## 2023-05-30 LAB — LIPID PANEL
Cholesterol: 117 mg/dL (ref 0–200)
HDL: 48 mg/dL (ref >40)
LDL Cholesterol: 61 mg/dL (ref 0–99)
Total CHOL/HDL Ratio: 2.4 ratio
Triglycerides: 42 mg/dL (ref <150)
VLDL: 8 mg/dL (ref 0–40)

## 2023-05-30 LAB — VITAMIN B12: Vitamin B-12: 272 pg/mL (ref 180–914)

## 2023-05-30 LAB — VITAMIN D 25 HYDROXY (VIT D DEFICIENCY, FRACTURES): Vit D, 25-Hydroxy: 46.6 ng/mL (ref 30–100)

## 2023-05-30 LAB — HIV ANTIBODY (ROUTINE TESTING W REFLEX): HIV Screen 4th Generation wRfx: NONREACTIVE

## 2023-05-30 NOTE — Telephone Encounter (Signed)
 Patient wife is calling in regarding patient labs

## 2023-05-30 NOTE — Telephone Encounter (Signed)
 Copied from CRM [id number]. Topic: Clinical - Lab/Test Results >> May 30, 2023  3:12 PM [place] A wrote: Reason for CRM: Patient's wife was returning call of Loetta Ringer. Agent called CAL Loetta Ringer was not available at the [facility] call back

## 2023-05-31 LAB — MICROALBUMIN / CREATININE URINE RATIO
Creatinine, Urine: 148.2 mg/dL
Microalb Creat Ratio: 13 mg/g{creat} (ref 0–29)
Microalb, Ur: 19 ug/mL — ABNORMAL HIGH

## 2023-06-01 DIAGNOSIS — E538 Deficiency of other specified B group vitamins: Secondary | ICD-10-CM | POA: Insufficient documentation

## 2023-06-01 DIAGNOSIS — Z114 Encounter for screening for human immunodeficiency virus [HIV]: Secondary | ICD-10-CM | POA: Insufficient documentation

## 2023-06-01 DIAGNOSIS — Z23 Encounter for immunization: Secondary | ICD-10-CM | POA: Insufficient documentation

## 2023-06-01 DIAGNOSIS — Z125 Encounter for screening for malignant neoplasm of prostate: Secondary | ICD-10-CM | POA: Insufficient documentation

## 2023-06-01 NOTE — Assessment & Plan Note (Signed)
 Chronic.  Stable.  Well-controlled on current blood pressure medications. Refill amlodipine  10 mg at night Refill telmisartan  80 mg at night Refill chlorthalidone  25 mg daily

## 2023-06-01 NOTE — Assessment & Plan Note (Signed)
 On statin.  No myalgias. Refill atorvastatin  20 mg daily

## 2023-06-01 NOTE — Assessment & Plan Note (Signed)
 Check Vitamin B 12 level

## 2023-06-01 NOTE — Assessment & Plan Note (Signed)
 Normal Following with urology

## 2023-06-01 NOTE — Assessment & Plan Note (Signed)
 Check Vitamin D level

## 2023-06-01 NOTE — Assessment & Plan Note (Addendum)
 Chronic.  Recent A1c 6.4  Obesity management is ongoing. Weight loss plateau despite diet and exercise efforts. - Encourage continued dietary discipline and exercise. - Discuss strategies to overcome weight loss plateau, including muscle toning exercises. Refill Metformin  1000 mg twice daily Continue statin and ARB therapy Eye exam due.  Patient to schedule appointment Foot exam completed

## 2023-06-13 ENCOUNTER — Other Ambulatory Visit: Payer: Self-pay

## 2023-06-20 ENCOUNTER — Ambulatory Visit: Admitting: Family Medicine

## 2023-06-23 ENCOUNTER — Other Ambulatory Visit: Payer: Self-pay | Admitting: Family Medicine

## 2023-06-23 DIAGNOSIS — E1159 Type 2 diabetes mellitus with other circulatory complications: Secondary | ICD-10-CM

## 2023-06-24 ENCOUNTER — Other Ambulatory Visit: Payer: Self-pay

## 2023-06-24 MED ORDER — AMLODIPINE BESYLATE 10 MG PO TABS
10.0000 mg | ORAL_TABLET | ORAL | 0 refills | Status: DC
Start: 2023-06-24 — End: 2023-08-04
  Filled 2023-06-24: qty 30, 30d supply, fill #0

## 2023-06-24 MED ORDER — TELMISARTAN 80 MG PO TABS
80.0000 mg | ORAL_TABLET | Freq: Every day | ORAL | 0 refills | Status: DC
  Filled 2023-06-24: qty 30, 30d supply, fill #0

## 2023-07-02 ENCOUNTER — Other Ambulatory Visit: Payer: Self-pay

## 2023-08-04 ENCOUNTER — Other Ambulatory Visit: Payer: Self-pay | Admitting: Family Medicine

## 2023-08-04 ENCOUNTER — Other Ambulatory Visit: Payer: Self-pay

## 2023-08-04 DIAGNOSIS — E1159 Type 2 diabetes mellitus with other circulatory complications: Secondary | ICD-10-CM

## 2023-08-04 MED ORDER — TELMISARTAN 80 MG PO TABS
80.0000 mg | ORAL_TABLET | Freq: Every day | ORAL | 0 refills | Status: DC
  Filled 2023-08-04: qty 30, 30d supply, fill #0

## 2023-08-04 MED ORDER — AMLODIPINE BESYLATE 10 MG PO TABS
10.0000 mg | ORAL_TABLET | ORAL | 0 refills | Status: DC
  Filled 2023-08-04: qty 30, 30d supply, fill #0

## 2023-08-06 ENCOUNTER — Other Ambulatory Visit: Payer: Self-pay

## 2023-08-08 ENCOUNTER — Telehealth: Payer: Self-pay

## 2023-08-08 ENCOUNTER — Ambulatory Visit
Admission: RE | Admit: 2023-08-08 | Discharge: 2023-08-08 | Disposition: A | Discharge status: Home / Self Care | Source: Ambulatory Visit

## 2023-08-08 ENCOUNTER — Ambulatory Visit: Payer: Self-pay

## 2023-08-08 NOTE — Telephone Encounter (Signed)
 Copied from CRM [id number]. Topic: Appointments - Scheduling Inquiry for Clinic >> Aug 08, 2023  1:49 PM [place] J wrote: Reason for CRM: Patient was wondering if he could be called for any openings in Dr. Graylon schedule for a transfer of care appointment.  I left voicemail for patient letting him know that I have added him to our wait list and explained how it works.  I asked him to please call us  if he has any questions.

## 2023-08-08 NOTE — Telephone Encounter (Signed)
 FYI Only or Action Required?: Action required by provider: request for appointment.  Patient was last seen in primary care on 05/23/2023 by Hope Merle, MD.  Called Nurse Triage reporting [place].  Symptoms began several months ago.  Interventions attempted: Nothing.  Symptoms are: gradually worsening.  Triage Disposition: See PCP When Office is Open (Within 3 Days)  Patient/caregiver understands and will follow disposition?: Yes     Copied from CRM [id number]. Topic: Clinical - Red Word Triage >> Aug 08, 2023  4:45 PM Chiquita SQUIBB wrote: Red Word that prompted transfer to Nurse Triage: Patient has swelling that he spoke to NT for earlier and is now stating the urgent care denied to see him so he is not sure what to do now. Reason for Disposition  [1] Small swelling or lump AND [2] unexplained AND [3] present > 1 week    First available appt given to patient & request send to PCP office to attempt to work patient in sooner.  Answer Assessment - Initial Assessment Questions 1. APPEARANCE of SWELLING: What does it look like?     Swollen , lump 2. SIZE: How large is the swelling? (e.g., inches, cm; or compare to size of pinhead, tip of pen, eraser, coin, pea, grape, ping pong ball)      nickel 3. LOCATION: Where is the swelling located?     Under right jaw 4. ONSET: When did the swelling start?     Ongoing for months 5. COLOR: What color is it? Is there more than one color?     red 6. PAIN: Is there any pain? If Yes, ask: How bad is the pain? (Scale 1-10; or mild, moderate, severe)       no 7. ITCH: Does it itch? If Yes, ask: How bad is the itch?      no 8. CAUSE: What do you think caused the swelling?     unknown 9 OTHER SYMPTOMS: Do you have any other symptoms? (e.g., fever)     No  Pt triaged earlier & sent to ED. Pt reported urgent care would not see he due they would need some imaging done & they are not able to do so.  Urgent care referred patient to  PCP.  Nurse was able to schedule appt for next for patient.  Protocols used: Skin Lump or Localized Swelling-A-AH

## 2023-08-08 NOTE — Telephone Encounter (Signed)
 Left message to return call to our office.

## 2023-08-08 NOTE — Telephone Encounter (Signed)
 FYI Only or Action Required?: FYI only for provider.  Patient was last seen in primary care on 05/23/2023 by Hope Merle, MD.  Called Nurse Triage reporting [place].  Symptoms began several weeks ago.  Interventions attempted: Nothing.  Symptoms are: gradually worsening.  Triage Disposition: See PCP When Office is Open (Within 3 Days)  Patient/caregiver understands and will follow disposition?: Yes.  Triage Disposition: See PCP When Office is Open (Within 3 Days) pt will go to UC.  Patient/caregiver understands and will follow disposition?: Yes                          Copied from CRM [id number]. Topic: Clinical - Red Word Triage >> Aug 08, 2023  1:48 PM [place] J wrote: [facility] that prompted transfer to Nurse Triage: Patient has a worsening lump on the right side under his jaw. that has grown in size over the past week. Reason for Disposition  [1] Small swelling or lump AND [2] unexplained AND [3] present > 1 week  Answer Assessment - Initial Assessment Questions 1. APPEARANCE of SWELLING: What does it look like?     Lump 2. SIZE: How large is the swelling? (e.g., inches, cm; or compare to size of pinhead, tip of pen, eraser, coin, pea, grape, ping pong ball)      Size of a nickel 3. LOCATION: Where is the swelling located?     Under right jaw 4. ONSET: When did the swelling start?     Months ago 5. COLOR: What color is it? Is there more than one color?     Turning red 6. PAIN: Is there any pain? If Yes, ask: How bad is the pain? (Scale 1-10; or mild, moderate, severe)       no 7. ITCH: Does it itch? If Yes, ask: How bad is the itch?      no 8. CAUSE: What do you think caused the swelling?     unsure 9 OTHER SYMPTOMS: Do you have any other symptoms? (e.g., fever)     no  Protocols used: Skin Lump or Localized Swelling-A-AH

## 2023-08-12 ENCOUNTER — Ambulatory Visit: Admitting: Nurse Practitioner

## 2023-08-12 ENCOUNTER — Other Ambulatory Visit: Payer: Self-pay

## 2023-08-12 ENCOUNTER — Encounter: Payer: Self-pay | Admitting: Nurse Practitioner

## 2023-08-12 VITALS — BP 124/82 | HR 80 | Temp 97.7°F | Ht 68.5 in | Wt 195.6 lb

## 2023-08-12 DIAGNOSIS — L0201 Cutaneous abscess of face: Secondary | ICD-10-CM

## 2023-08-12 DIAGNOSIS — R221 Localized swelling, mass and lump, neck: Secondary | ICD-10-CM

## 2023-08-12 MED ORDER — SULFAMETHOXAZOLE-TRIMETHOPRIM 800-160 MG PO TABS
1.0000 | ORAL_TABLET | Freq: Two times a day (BID) | ORAL | 0 refills | Status: DC
  Filled 2023-08-12: qty 20, 10d supply, fill #0

## 2023-08-13 LAB — CBC WITH DIFFERENTIAL/PLATELET
Basophils Absolute: 0.1 K/uL (ref 0.0–0.1)
Basophils Relative: 0.9 % (ref 0.0–3.0)
Eosinophils Absolute: 0.2 K/uL (ref 0.0–0.7)
Eosinophils Relative: 2.1 % (ref 0.0–5.0)
HCT: 43.5 % (ref 39.0–52.0)
Hemoglobin: 14.8 g/dL (ref 13.0–17.0)
Lymphocytes Relative: 14.5 % (ref 12.0–46.0)
Lymphs Abs: 1.3 K/uL (ref 0.7–4.0)
MCHC: 34.1 g/dL (ref 30.0–36.0)
MCV: 87.4 fl (ref 78.0–100.0)
Monocytes Absolute: 0.5 K/uL (ref 0.1–1.0)
Monocytes Relative: 5.7 % (ref 3.0–12.0)
Neutro Abs: 6.9 K/uL (ref 1.4–7.7)
Neutrophils Relative %: 76.8 % (ref 43.0–77.0)
Platelets: 345 K/uL (ref 150.0–400.0)
RBC: 4.98 Mil/uL (ref 4.22–5.81)
RDW: 13.4 % (ref 11.5–15.5)
WBC: 9 K/uL (ref 4.0–10.5)

## 2023-08-14 ENCOUNTER — Ambulatory Visit: Payer: Self-pay | Admitting: Nurse Practitioner

## 2023-08-15 ENCOUNTER — Ambulatory Visit: Admitting: Nurse Practitioner

## 2023-08-15 ENCOUNTER — Encounter: Payer: Self-pay | Admitting: Nurse Practitioner

## 2023-08-15 VITALS — BP 122/76 | HR 67 | Temp 97.7°F | Ht 68.5 in | Wt 193.6 lb

## 2023-08-15 DIAGNOSIS — L0201 Cutaneous abscess of face: Secondary | ICD-10-CM

## 2023-08-18 ENCOUNTER — Ambulatory Visit: Admitting: Internal Medicine

## 2023-08-27 DIAGNOSIS — L0201 Cutaneous abscess of face: Secondary | ICD-10-CM | POA: Insufficient documentation

## 2023-08-27 NOTE — Progress Notes (Signed)
 Established Patient Office Visit  Subjective:  Patient ID: Andrew Stevens, male    DOB: 07-13-1960  Age: 63 y.o. MRN: [id number]  CC:  Chief Complaint  Patient presents with   Acute Visit    Right jaw swelling x 1 week Last night got bigger and a head on it  Discussed the use of a AI scribe software for clinical note transcription with the patient, who gave verbal consent to proceed.   HPI Andrew Stevens is a 63 year old male who presents with a progressively enlarging lump on on the right side of his neck that resemble a skin abscess.  Approximately one month ago, he noticed a small lump on his neck that has gradually increased in size. Today, it is more prominent with a noticeable head formation resembling a pimple. There is no significant pain, but it is uncomfortable due to its location.  He is concerned about the lump because of a family history of a nephew with a similar presentation that was cancerous. He has a history of pimples, and his mother and siblings have similar skin issues. He wonders if shaving might be aggravating the condition. He works in an operating room and is concerned about the appearance of the lump at work, noting that it might require covering with a bandage.  HPI   Past Medical History:  Diagnosis Date   Acute bronchitis 01/17/2016   Annual physical exam 01/01/2018   Anxiety    Diabetes mellitus without complication (HCC)    Difficult intubation    per pt, during kidney stone procedure at [facility]   Erectile dysfunction    GERD (gastroesophageal reflux disease)    occ. takes rolaids or tums if needed   Groin pain 01/11/2014   Headache    hisotry of with brain tumor   Hematuria 02/03/2017   History of brain tumor    History of kidney stones 2019   Bilateral   History of umbilical hernia    Hydronephrosis 2019   Moderate right hydronephrosis   Hyperlipidemia    Hypertension    Hypertension 01/04/2011   Lipoma of extremity    left inner  thigh, right groin   Lipoma of left lower extremity 02/03/2017   Mass of testicle 02/03/2017   Personal history of colonic polyps    Personal history of kidney stones 02/24/2017   Pilonidal cyst 02/03/2017   Routine general medical examination at a health care facility 05/20/2013   Screening for colon cancer 05/20/2013    Past Surgical History:  Procedure Laterality Date   BRAIN SURGERY  1989   Cyst removed - benign   COLONOSCOPY  2015   COLONOSCOPY N/A 04/20/2021   Procedure: COLONOSCOPY WITH BIOPSY;  Surgeon: Jinny Carmine, MD;  Location: [facility];  Service: Endoscopy;  Laterality: N/A;  Diabetic   CYSTOSCOPY W/ URETERAL STENT PLACEMENT Right 05/27/2017   Procedure: CYSTOSCOPY WITH STENT REPLACEMENT;  Surgeon: Twylla Glendia BROCKS, MD;  Location: [facility];  Service: Urology;  Laterality: Right;   CYSTOSCOPY WITH STENT PLACEMENT Right 01/23/2018   Procedure: CYSTOSCOPY WITH STENT PLACEMENT;  Surgeon: Francisca Redell BROCKS, MD;  Location: [facility];  Service: Urology;  Laterality: Right;   CYSTOSCOPY WITH STENT PLACEMENT Left 06/20/2020   Procedure: CYSTOSCOPY WITH STENT PLACEMENT;  Surgeon: Twylla Glendia BROCKS, MD;  Location: [facility];  Service: Urology;  Laterality: Left;   CYSTOSCOPY/RETROGRADE/URETEROSCOPY Right 01/23/2018   Procedure: CYSTOSCOPY/RETROGRADE/URETEROSCOPY;  Surgeon: Francisca Redell BROCKS, MD;  Location: [facility];  Service:  Urology;  Laterality: Right;   CYSTOSCOPY/URETEROSCOPY/HOLMIUM LASER Right 05/27/2017   Procedure: CYSTOSCOPY/URETEROSCOPY/HOLMIUM LASER;  Surgeon: Twylla Glendia BROCKS, MD;  Location: [facility];  Service: Urology;  Laterality: Right;   CYSTOSCOPY/URETEROSCOPY/HOLMIUM LASER/STENT PLACEMENT Right 04/11/2017   Procedure: CYSTOSCOPY/URETEROSCOPY/HOLMIUM LASER/STENT PLACEMENT;  Surgeon: Twylla Glendia BROCKS, MD;  Location: [facility];  Service: Urology;  Laterality: Right;   CYSTOSCOPY/URETEROSCOPY/HOLMIUM LASER/STENT PLACEMENT Right 06/18/2018   Procedure: CYSTOSCOPY/  RETROGRADE//URETEROSCOPY/HOLMIUM LASER/STENT PLACEMENT;  Surgeon: Renda Glance, MD;  Location: [facility];  Service: Urology;  Laterality: Right;   CYSTOSCOPY/URETEROSCOPY/HOLMIUM LASER/STENT PLACEMENT Right 11/23/2018   Procedure: CYSTOSCOPY/RETROGRADE PYELOGRAM/URETEROSCOPY/HOLMIUM LASER/STENT PLACEMENT;  Surgeon: Renda Glance, MD;  Location: [facility];  Service: Urology;  Laterality: Right;   CYSTOSCOPY/URETEROSCOPY/HOLMIUM LASER/STENT PLACEMENT Left 08/10/2020   Procedure: CYSTOSCOPY/LEFT URETEROSCOPY/HOLMIUM LASER/STENT PLACEMENT;  Surgeon: Renda Glance, MD;  Location: [facility];  Service: Urology;  Laterality: Left;   INGUINAL HERNIA REPAIR     MASS EXCISION Bilateral 04/22/2017   Procedure: EXCISION OF 3  MASSES OF PERIRECTAL, INNER THIGH AREAS;  Surgeon: Gladis Cough, MD;  Location: [facility];  Service: General;  Laterality: Bilateral;  LMA   POLYPECTOMY N/A 04/20/2021   Procedure: POLYPECTOMY;  Surgeon: Jinny Carmine, MD;  Location: [facility];  Service: Endoscopy;  Laterality: N/A;   ROTATOR CUFF REPAIR Right 2014   Dr. 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 Dog. Hobbies - Football.      3 grandchildren, 2 children, married to wife x 47 years as of 01/08/19   From [facility]      Regular Exercise -  NO   Daily Caffeine Use:  4 cups coffee in AM, 1 cup tea in AM         Social Drivers of Health   Financial Resource Strain: Not on file  Food Insecurity: Not on file  Transportation Needs: Not on file  Physical Activity: Not on file  Stress: Not on file  Social Connections: Not on file  Intimate Partner Violence: Not on file     Outpatient Medications Prior to Visit  Medication Sig Dispense Refill   amLODipine  (NORVASC ) 10 MG tablet Take 1 tablet (10 mg total) by mouth every morning. 30 tablet 0   aspirin  EC 81 MG tablet Take 1 tablet (81 mg total) by mouth  daily. Swallow whole.     atorvastatin  (LIPITOR) 20 MG tablet Take 1 tablet (20 mg total) by mouth at bedtime. 90 tablet 3   chlorthalidone  (HYGROTON ) 25 MG tablet Take 1 tablet (25 mg total) by mouth daily. 90 tablet 3   metFORMIN  (GLUCOPHAGE ) 500 MG tablet Take 2 tablets (1,000 mg total) by mouth 2 (two) times daily with a meal. 360 tablet 3   potassium citrate  (UROCIT-K ) 10 MEQ (1080 MG) SR tablet TAKE THREE TABLETS BY MOUTH TWICE DAILY 180 tablet 5   sildenafil  (VIAGRA ) 100 MG tablet Take 1 tablet (100 mg total) by mouth as directed as needed. 30 tablet 11   telmisartan  (MICARDIS ) 80 MG tablet Take 1 tablet (80 mg total) by mouth daily. 30 tablet 0   No facility-administered medications prior to visit.    No Known Allergies  ROS Review of Systems Negative unless indicated in HPI.    Objective:    Physical Exam HENT:     Head:      Comments: Single erythematous pustule noted on the neck consistent with inflamed follicle. Cardiovascular:     Rate and Rhythm: Normal rate and regular rhythm.     Pulses: Normal pulses.  Pulmonary:     Effort: Pulmonary effort is normal.      Breath sounds: Normal breath sounds. No wheezing.  Neurological:     General: No focal deficit present.     Mental Status: He is alert and oriented to person, place, and time.  Psychiatric:        Mood and Affect: Mood normal.        Behavior: Behavior normal.     BP 124/82   Pulse 80   Temp 97.7 F (36.5 C)   Ht 5' 8.5 (1.74 m)   Wt 195 lb 9.6 oz (88.7 kg)   SpO2 98%   BMI 29.31 kg/m  Wt Readings from Last 3 Encounters:  08/15/23 193 lb 9.6 oz (87.8 kg)  08/12/23 195 lb 9.6 oz (88.7 kg)  05/23/23 195 lb (88.5 kg)     Health Maintenance  Topic Date Due   COVID-19 Vaccine (6 - 2024-25 season) 08/27/2023 (Originally 09/29/2022)   INFLUENZA VACCINE  08/29/2023   HEMOGLOBIN A1C  11/22/2023   OPHTHALMOLOGY EXAM  01/23/2024   Colonoscopy  04/20/2024   FOOT EXAM  05/22/2024   Diabetic kidney evaluation - eGFR measurement  05/29/2024   Diabetic kidney evaluation - Urine ACR  05/29/2024   DTaP/Tdap/Td (3 - Td or Tdap) 03/15/2027   Pneumococcal Vaccine 44-38 Years old  Completed   Hepatitis C Screening  Completed   HIV Screening  Completed   Zoster Vaccines- Shingrix  Completed   Hepatitis B Vaccines  Aged Out   HPV VACCINES  Aged Out   Meningococcal B Vaccine  Aged Out    There are no preventive care reminders to display for this patient.  Lab Results  Component Value Date   TSH 1.20 01/17/2021   Lab Results  Component Value Date   WBC 9.0 08/12/2023   HGB 14.8 08/12/2023   HCT 43.5 08/12/2023   MCV 87.4 08/12/2023   PLT 345.0 08/12/2023   Lab Results  Component Value Date   NA 138 05/30/2023   K 4.0 05/30/2023   CO2 24 05/30/2023   GLUCOSE 143 (H) 05/30/2023   BUN 22 05/30/2023   CREATININE 1.06 05/30/2023   BILITOT 1.0 05/30/2023   ALKPHOS 47 05/30/2023   AST 27 05/30/2023   ALT 21  05/30/2023   PROT 7.4 05/30/2023   ALBUMIN 4.1 05/30/2023   CALCIUM  9.2 05/30/2023   ANIONGAP 11 05/30/2023   GFR 69.81 01/17/2021   Lab Results  Component Value  Date   CHOL 117 05/30/2023   Lab Results  Component Value Date   HDL 48 05/30/2023   Lab Results  Component Value Date   LDLCALC 61 05/30/2023   Lab Results  Component Value Date   TRIG 42 05/30/2023   Lab Results  Component Value Date   CHOLHDL 2.4 05/30/2023   Lab Results  Component Value Date   HGBA1C 6.4 (A) 05/23/2023      Assessment & Plan:  Cutaneous abscess of face Assessment & Plan: Abscess present for one month, increasing in size, uncomfortable. Patient expresses concern about possible malignancy due to family history.  - Prescribed Bactrim  for 10 days. - Ordered blood work to assess for infection. - Advised warm compresses to encourage drainage. - Instructed to avoid shaving the affected area. - Scheduled follow-up appointment on Friday to reassess. - Consider imaging if no improvement or continued growth.  Orders: -     CBC with Differential/Platelet  Other orders -     Sulfamethoxazole -Trimethoprim ; Take 1 tablet by mouth 2 (two) times daily.  Dispense: 20 tablet; Refill: 0    Follow-up: Return in about 4 days (around 08/16/2023) for 3-4 days.   Meghan Warshawsky, NP

## 2023-08-27 NOTE — Assessment & Plan Note (Signed)
 Abscess present for one month, increasing in size, uncomfortable. Patient expresses concern about possible malignancy due to family history.  - Prescribed Bactrim  for 10 days. - Ordered blood work to assess for infection. - Advised warm compresses to encourage drainage. - Instructed to avoid shaving the affected area. - Scheduled follow-up appointment on Friday to reassess. - Consider imaging if no improvement or continued growth.

## 2023-08-28 NOTE — Progress Notes (Signed)
 Established Patient Office Visit  Subjective:  Patient ID: Andrew Stevens, male    DOB: [birth year]  Age: 63 y.o. MRN: [id number]  CC:  Chief Complaint  Patient presents with   Acute Visit    4 day follow up   Discussed the use of a AI scribe software for clinical note transcription with the patient, who gave verbal consent to proceed.  HPI  Andrew Stevens is a 63 year old male who presents for follow-up on skin abscess that has improved after treatment.  He initially noticed swelling and a dark root in the facial lesion. After squeezing it, bleeding occurred, and the swelling reduced. The lesion has since improved significantly. He attributes the lesion to shaving, which he does every other day, and believes it may have aggravated the condition. He is on a ten-day course of medication, with the next dose scheduled for 5:30 PM today. He expresses relief that recent test results showed no infection  HPI   Past Medical History:  Diagnosis Date   Acute bronchitis 01/17/2016   Annual physical exam 01/01/2018   Anxiety    Diabetes mellitus without complication (HCC)    Difficult intubation    per pt, during kidney stone procedure at [facility]   Erectile dysfunction    GERD (gastroesophageal reflux disease)    occ. takes rolaids or tums if needed   Groin pain 01/11/2014   Headache    hisotry of with brain tumor   Hematuria 02/03/2017   History of brain tumor    History of kidney stones 2019   Bilateral   History of umbilical hernia    Hydronephrosis 2019   Moderate right hydronephrosis   Hyperlipidemia    Hypertension    Hypertension 01/04/2011   Lipoma of extremity    left inner thigh, right groin   Lipoma of left lower extremity 02/03/2017   Mass of testicle 02/03/2017   Personal history of colonic polyps    Personal history of kidney stones 02/24/2017   Pilonidal cyst 02/03/2017   Routine general medical examination at a health care facility 05/20/2013   Screening for  colon cancer 05/20/2013    Past Surgical History:  Procedure Laterality Date   BRAIN SURGERY  1989   Cyst removed - benign   COLONOSCOPY  2015   COLONOSCOPY N/A 04/20/2021   Procedure: COLONOSCOPY WITH BIOPSY;  Surgeon: Jinny Carmine, MD;  Location: [facility];  Service: Endoscopy;  Laterality: N/A;  Diabetic   CYSTOSCOPY W/ URETERAL STENT PLACEMENT Right 05/27/2017   Procedure: CYSTOSCOPY WITH STENT REPLACEMENT;  Surgeon: Twylla Glendia BROCKS, MD;  Location: [facility];  Service: Urology;  Laterality: Right;   CYSTOSCOPY WITH STENT PLACEMENT Right 01/23/2018   Procedure: CYSTOSCOPY WITH STENT PLACEMENT;  Surgeon: Francisca Redell BROCKS, MD;  Location: [facility];  Service: Urology;  Laterality: Right;   CYSTOSCOPY WITH STENT PLACEMENT Left 06/20/2020   Procedure: CYSTOSCOPY WITH STENT PLACEMENT;  Surgeon: Twylla Glendia BROCKS, MD;  Location: [facility];  Service: Urology;  Laterality: Left;   CYSTOSCOPY/RETROGRADE/URETEROSCOPY Right 01/23/2018   Procedure: CYSTOSCOPY/RETROGRADE/URETEROSCOPY;  Surgeon: Francisca Redell BROCKS, MD;  Location: [facility];  Service: Urology;  Laterality: Right;   CYSTOSCOPY/URETEROSCOPY/HOLMIUM LASER Right 05/27/2017   Procedure: CYSTOSCOPY/URETEROSCOPY/HOLMIUM LASER;  Surgeon: Twylla Glendia BROCKS, MD;  Location: [facility];  Service: Urology;  Laterality: Right;   CYSTOSCOPY/URETEROSCOPY/HOLMIUM LASER/STENT PLACEMENT Right 04/11/2017   Procedure: CYSTOSCOPY/URETEROSCOPY/HOLMIUM LASER/STENT PLACEMENT;  Surgeon: Twylla Glendia BROCKS, MD;  Location: [facility];  Service: Urology;  Laterality: Right;  CYSTOSCOPY/URETEROSCOPY/HOLMIUM LASER/STENT PLACEMENT Right 06/18/2018   Procedure: CYSTOSCOPY/ RETROGRADE//URETEROSCOPY/HOLMIUM LASER/STENT PLACEMENT;  Surgeon: Renda Glance, MD;  Location: [facility];  Service: Urology;  Laterality: Right;   CYSTOSCOPY/URETEROSCOPY/HOLMIUM LASER/STENT PLACEMENT Right 11/23/2018   Procedure: CYSTOSCOPY/RETROGRADE PYELOGRAM/URETEROSCOPY/HOLMIUM LASER/STENT PLACEMENT;   Surgeon: Renda Glance, MD;  Location: [facility];  Service: Urology;  Laterality: Right;   CYSTOSCOPY/URETEROSCOPY/HOLMIUM LASER/STENT PLACEMENT Left 08/10/2020   Procedure: CYSTOSCOPY/LEFT URETEROSCOPY/HOLMIUM LASER/STENT PLACEMENT;  Surgeon: Renda Glance, MD;  Location: [facility];  Service: Urology;  Laterality: Left;   INGUINAL HERNIA REPAIR     MASS EXCISION Bilateral 04/22/2017   Procedure: EXCISION OF 3  MASSES OF PERIRECTAL, INNER THIGH AREAS;  Surgeon: Gladis Cough, MD;  Location: [facility];  Service: General;  Laterality: Bilateral;  LMA   POLYPECTOMY N/A 04/20/2021   Procedure: POLYPECTOMY;  Surgeon: Jinny Carmine, MD;  Location: [facility];  Service: Endoscopy;  Laterality: N/A;   ROTATOR CUFF REPAIR Right 2014   Dr. 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 Dog. Hobbies - Football.      3 grandchildren, 2 children, married to wife x 47 years as of 01/08/19   From [facility]      Regular Exercise -  NO   Daily Caffeine Use:  4 cups coffee in AM, 1 cup tea in AM          Social Drivers of Health   Financial Resource Strain: Not on file  Food Insecurity: Not on file  Transportation Needs: Not on file  Physical Activity: Not on file  Stress: Not on file  Social Connections: Not on file  Intimate Partner Violence: Not on file     Outpatient Medications Prior to Visit  Medication Sig Dispense Refill   amLODipine  (NORVASC ) 10 MG tablet Take 1 tablet (10 mg total) by mouth every morning. 30 tablet 0   aspirin  EC 81 MG tablet Take 1 tablet (81 mg total) by mouth daily. Swallow whole.     atorvastatin  (LIPITOR) 20 MG tablet Take 1 tablet (20 mg total) by mouth at bedtime. 90 tablet 3   chlorthalidone  (HYGROTON ) 25 MG tablet Take 1 tablet (25 mg total) by mouth daily. 90 tablet 3   metFORMIN  (GLUCOPHAGE ) 500 MG tablet Take 2 tablets (1,000 mg total) by mouth 2 (two) times  daily with a meal. 360 tablet 3   potassium citrate  (UROCIT-K ) 10 MEQ (1080 MG) SR tablet TAKE THREE TABLETS BY MOUTH TWICE DAILY 180 tablet 5   sildenafil  (VIAGRA ) 100 MG tablet Take 1 tablet (100 mg total) by mouth as directed as needed. 30 tablet 11   sulfamethoxazole -trimethoprim  (BACTRIM  DS) 800-160 MG tablet Take 1 tablet by mouth 2 (two) times daily. 20 tablet 0   telmisartan  (MICARDIS ) 80 MG tablet Take 1 tablet (80 mg total) by mouth daily. 30 tablet 0   No facility-administered medications prior to visit.    No Known Allergies  ROS Review of Systems Negative unless indicated in HPI.    Objective:    Physical Exam Constitutional:      Appearance: Normal appearance.  Cardiovascular:     Rate and Rhythm: Normal rate and regular rhythm.     Pulses: Normal pulses.     Heart sounds: Normal heart sounds.  Musculoskeletal:     Cervical back: Normal range of motion.  Skin:    Findings: Lesion present.  Neurological:     General: No focal deficit present.     Mental Status: He is alert. Mental status is at baseline.  Psychiatric:        Mood and Affect: Mood normal.         Behavior: Behavior normal.        Thought Content: Thought content normal.        Judgment: Judgment normal.     BP 122/76   Pulse 67   Temp 97.7 F (36.5 C)   Ht 5' 8.5 (1.74 m)   Wt 193 lb 9.6 oz (87.8 kg)   SpO2 98%   BMI 29.01 kg/m  Wt Readings from Last 3 Encounters:  08/15/23 193 lb 9.6 oz (87.8 kg)  08/12/23 195 lb 9.6 oz (88.7 kg)  05/23/23 195 lb (88.5 kg)     Health Maintenance  Topic Date Due   COVID-19 Vaccine (6 - 2024-25 season) 09/29/2022   INFLUENZA VACCINE  08/29/2023   HEMOGLOBIN A1C  11/22/2023   OPHTHALMOLOGY EXAM  01/23/2024   Colonoscopy  04/20/2024   FOOT EXAM  05/22/2024   Diabetic kidney evaluation - eGFR measurement  05/29/2024   Diabetic kidney evaluation - Urine ACR  05/29/2024   DTaP/Tdap/Td (3 - Td or Tdap) 03/15/2027   Pneumococcal Vaccine: 19-49 Years  Completed   Pneumococcal Vaccine: 50+ Years  Completed   Hepatitis C Screening  Completed   HIV Screening  Completed   Zoster Vaccines- Shingrix  Completed   Hepatitis B Vaccines  Aged Out   HPV VACCINES  Aged Out   Meningococcal B Vaccine  Aged Out    There are no preventive care reminders to display for this patient.  Lab Results  Component Value Date   TSH 1.20 01/17/2021   Lab Results  Component Value Date   WBC 9.0 08/12/2023   HGB 14.8 08/12/2023   HCT 43.5 08/12/2023   MCV 87.4 08/12/2023   PLT 345.0 08/12/2023   Lab Results  Component Value Date   NA 138 05/30/2023   K 4.0 05/30/2023   CO2 24 05/30/2023   GLUCOSE 143 (H) 05/30/2023   BUN 22 05/30/2023   CREATININE 1.06 05/30/2023   BILITOT 1.0 05/30/2023   ALKPHOS 47 05/30/2023   AST 27 05/30/2023   ALT 21 05/30/2023   PROT 7.4 05/30/2023   ALBUMIN 4.1 05/30/2023   CALCIUM  9.2 05/30/2023   ANIONGAP 11 05/30/2023  GFR 69.81 01/17/2021   Lab Results  Component Value Date   CHOL 117 05/30/2023   Lab Results  Component Value Date   HDL 48 05/30/2023   Lab Results  Component Value Date    LDLCALC 61 05/30/2023   Lab Results  Component Value Date   TRIG 42 05/30/2023   Lab Results  Component Value Date   CHOLHDL 2.4 05/30/2023   Lab Results  Component Value Date   HGBA1C 6.4 (A) 05/23/2023      Assessment & Plan:  Cutaneous abscess of face Assessment & Plan: Lesion improved after drainage and antibiotics.  - Complete 10-day antibiotic course. - Avoid shaving for a few days. - Use electric shaver to prevent irritation. - Cover lesion with Band-Aid to prevent mask irritation. - Monitor for infection signs, return if needed.     Follow-up: No follow-ups on file.   Demarkus Remmel, NP

## 2023-08-28 NOTE — Assessment & Plan Note (Signed)
 Lesion improved after drainage and antibiotics.  - Complete 10-day antibiotic course. - Avoid shaving for a few days. - Use electric shaver to prevent irritation. - Cover lesion with Band-Aid to prevent mask irritation. - Monitor for infection signs, return if needed.

## 2023-09-06 ENCOUNTER — Other Ambulatory Visit: Payer: Self-pay

## 2023-09-06 DIAGNOSIS — E1159 Type 2 diabetes mellitus with other circulatory complications: Secondary | ICD-10-CM

## 2023-09-08 ENCOUNTER — Other Ambulatory Visit: Payer: Self-pay

## 2023-09-08 DIAGNOSIS — E1159 Type 2 diabetes mellitus with other circulatory complications: Secondary | ICD-10-CM

## 2023-09-09 ENCOUNTER — Other Ambulatory Visit: Payer: Self-pay

## 2023-09-09 MED FILL — Telmisartan Tab 80 MG: ORAL | 90 days supply | Qty: 90 | Fill #0 | Status: AC

## 2023-09-09 MED FILL — Amlodipine Besylate Tab 10 MG (Base Equivalent): ORAL | 90 days supply | Qty: 90 | Fill #0 | Status: AC

## 2023-09-25 ENCOUNTER — Other Ambulatory Visit: Payer: Self-pay

## 2023-09-25 ENCOUNTER — Ambulatory Visit

## 2023-09-25 VITALS — BP 122/70 | HR 88 | Temp 98.2°F | Ht 70.0 in | Wt 194.8 lb

## 2023-09-25 DIAGNOSIS — E538 Deficiency of other specified B group vitamins: Secondary | ICD-10-CM

## 2023-09-25 DIAGNOSIS — Z7984 Long term (current) use of oral hypoglycemic drugs: Secondary | ICD-10-CM

## 2023-09-25 DIAGNOSIS — E785 Hyperlipidemia, unspecified: Secondary | ICD-10-CM | POA: Diagnosis not present

## 2023-09-25 DIAGNOSIS — I152 Hypertension secondary to endocrine disorders: Secondary | ICD-10-CM | POA: Diagnosis not present

## 2023-09-25 DIAGNOSIS — E118 Type 2 diabetes mellitus with unspecified complications: Secondary | ICD-10-CM | POA: Diagnosis not present

## 2023-09-25 DIAGNOSIS — E1169 Type 2 diabetes mellitus with other specified complication: Secondary | ICD-10-CM

## 2023-09-25 DIAGNOSIS — Z125 Encounter for screening for malignant neoplasm of prostate: Secondary | ICD-10-CM

## 2023-09-25 DIAGNOSIS — E1159 Type 2 diabetes mellitus with other circulatory complications: Secondary | ICD-10-CM | POA: Diagnosis not present

## 2023-09-25 DIAGNOSIS — L720 Epidermal cyst: Secondary | ICD-10-CM

## 2023-09-25 DIAGNOSIS — Z0000 Encounter for general adult medical examination without abnormal findings: Secondary | ICD-10-CM

## 2023-09-25 DIAGNOSIS — N200 Calculus of kidney: Secondary | ICD-10-CM | POA: Diagnosis not present

## 2023-09-25 MED ORDER — TELMISARTAN 80 MG PO TABS
80.0000 mg | ORAL_TABLET | Freq: Every day | ORAL | 4 refills | Status: AC
  Filled 2023-09-25 – 2023-12-13 (×2): qty 30, 30d supply, fill #0
  Filled 2024-01-18: qty 30, 30d supply, fill #1
  Filled 2024-02-15 – 2024-02-16 (×2): qty 30, 30d supply, fill #2

## 2023-09-25 MED ORDER — ATORVASTATIN CALCIUM 20 MG PO TABS
20.0000 mg | ORAL_TABLET | Freq: Every day | ORAL | 3 refills | Status: AC
Start: 2023-09-25 — End: ?
  Filled 2023-09-25 – 2023-10-27 (×2): qty 90, 90d supply, fill #0

## 2023-09-25 MED ORDER — ASPIRIN 81 MG PO TBEC
81.0000 mg | DELAYED_RELEASE_TABLET | Freq: Every day | ORAL | 3 refills | Status: AC
  Filled 2023-09-25 – 2023-12-22 (×2): qty 90, 90d supply, fill #0

## 2023-09-25 MED ORDER — METFORMIN HCL 500 MG PO TABS
500.0000 mg | ORAL_TABLET | Freq: Two times a day (BID) | ORAL | 3 refills | Status: AC
Start: 2023-09-25 — End: 2024-09-19
  Filled 2023-09-25 – 2023-12-13 (×2): qty 180, 90d supply, fill #0

## 2023-09-25 MED ORDER — AMLODIPINE BESYLATE 10 MG PO TABS
10.0000 mg | ORAL_TABLET | ORAL | 4 refills | Status: AC
  Filled 2023-09-25 – 2023-12-13 (×2): qty 30, 30d supply, fill #0
  Filled 2024-01-18: qty 30, 30d supply, fill #1
  Filled 2024-02-15 – 2024-02-16 (×2): qty 30, 30d supply, fill #2

## 2023-09-25 MED ORDER — CHLORTHALIDONE 25 MG PO TABS
25.0000 mg | ORAL_TABLET | Freq: Every day | ORAL | 3 refills | Status: AC
  Filled 2023-09-25 – 2023-12-13 (×2): qty 90, 90d supply, fill #0

## 2023-09-25 NOTE — Assessment & Plan Note (Signed)
 F/u and management per urology.  Monitor for signs of kidney stones, such as flank pain.  Encourage continued adequate hydration and low salt diet

## 2023-09-25 NOTE — Assessment & Plan Note (Signed)
 Chronic, LDL within goal <70, continue Lipitor 20 mg at bedtime.

## 2023-09-25 NOTE — Patient Instructions (Signed)
 Continue your medication as you are taking currently.   I will update you once I have your lab results.

## 2023-09-25 NOTE — Assessment & Plan Note (Signed)
 Check PSA, has a h/o elevated PSA in the past.

## 2023-09-25 NOTE — Assessment & Plan Note (Signed)
 Chronic with microalbuminuria.  Check A1c, urine microalbumin. Continue metformin  500 mg twice daily. Encourage adherence to low carbohydrate diet and regular exercise. On Lipitor 20 mg, aspirin  81 mg continue.

## 2023-09-25 NOTE — Assessment & Plan Note (Signed)
 Low normal B12 in the past, likely related to Metformin  use. Benefit of metformin  outweighs so continue Metformin  for DM.  Check B 12 level, management pending results.

## 2023-09-25 NOTE — Progress Notes (Signed)
 Established Patient Office Visit TOC from Dr. Hope  Annual Physical    Subjective  Patient ID: Andrew Stevens, male    DOB: Mar 01, 1960  Age: 63 y.o. MRN: [id number]  Chief Complaint  Patient presents with   Establish Care    He  has a past medical history of Acute bronchitis (01/17/2016), Annual physical exam (01/01/2018), Anxiety, Diabetes mellitus without complication (HCC), Difficult intubation, Erectile dysfunction, GERD (gastroesophageal reflux disease), Groin pain (01/11/2014), Headache, Hematuria (02/03/2017), History of brain tumor, History of kidney stones (2019), History of umbilical hernia, Hydronephrosis (2019), Hyperlipidemia, Hypertension, Hypertension (01/04/2011), Lipoma of extremity, Lipoma of left lower extremity (02/03/2017), Mass of testicle (02/03/2017), Personal history of colonic polyps, Personal history of kidney stones (02/24/2017), Pilonidal cyst (02/03/2017), Routine general medical examination at a health care facility (05/20/2013), and Screening for colon cancer (05/20/2013).  HPI Discussed the use of AI scribe software for clinical note transcription with the patient, who gave verbal consent to proceed.  History of Present Illness Andrew Stevens is a 63 year old male who presents for annual physical, transfer of care and chronic medication check.   Patient is currently on amlodipine  10 mg, Chlorthalidone  25 mg daily, telmisartan  80 mg daily for hypertension management. He also has a h/o DM with microalbuminuria, currently on aspirin  81 mg daily, Metoprolol 500 mg twice daily, Lipitor 20 mg at night.   No chest pain, palpitations, or leg swelling. He wears compression socks and is active at work in the OR.  He has a history of kidney stones and has had multiple stents placed by his urologist. He drinks about 50-60 ounces of water  daily and tries to limit his salt intake. He has not had a kidney stone in over a year. He reports frequent urination but denies  holding urine. Since he is on Chlorthalidone  he is prescribed k clor 30 meq BID. He also takes prn Sildenafil  which is prescribed by his urologist.   Has a h/o keloid formation in the past. He recently had a cyst on right jaw with intermittent discharge, was treated with Bactrim .   He reports occasional alcohol intake, primarily beer. He exercises by walking on a treadmill at home and maintains a normal bowel movement pattern. He has a history of low B12 levels.  His last hemoglobin A1c was 6.8%. He is due for a repeat A1c test today. He has a history of hyperlipidemia, with his last LDL cholesterol at 61 mg/dL, which is within goal. He is not taking any calcium  supplements to avoid the risk of kidney stones.   ROS As per HPI    Objective:     BP 122/70 (BP Location: Right Arm, Patient Position: Sitting, Cuff Size: Normal)   Pulse 88   Temp 98.2 F (36.8 C) (Oral)   Ht 5' 10 (1.778 m)   Wt 194 lb 12.8 oz (88.4 kg)   SpO2 99%   BMI 27.95 kg/m      09/25/2023    2:11 PM 08/15/2023    4:00 PM 08/12/2023    4:27 PM  Depression screen PHQ 2/9  Decreased Interest 0 0 0  Down, Depressed, Hopeless 0 0 0  PHQ - 2 Score 0 0 0  Altered sleeping 0 0 0  Tired, decreased energy 0 0 0  Change in appetite 0 0 0  Feeling bad or failure about yourself  0 0 0  Trouble concentrating 0 0 0  Moving slowly or fidgety/restless 0 0 0  Suicidal thoughts  0 0 0  PHQ-9 Score 0 0 0  Difficult doing work/chores Not difficult at all Not difficult at all Not difficult at all      09/25/2023    2:11 PM 08/15/2023    4:00 PM 08/12/2023    4:27 PM 06/27/2022    2:34 PM  GAD 7 : Generalized Anxiety Score  Nervous, Anxious, on Edge 0 0 0 0  Control/stop worrying 0 0 0 0  Worry too much - different things 0 0 0 0  Trouble relaxing 0 0 0 0  Restless 0 0 0 0  Easily annoyed or irritable 0 0 0 0  Afraid - awful might happen 0 0 0 0  Total GAD 7 Score 0 0 0 0  Anxiety Difficulty Not difficult at all Not  difficult at all Not difficult at all Not difficult at all      09/25/2023    2:11 PM 08/15/2023    4:00 PM 08/12/2023    4:27 PM  Depression screen PHQ 2/9  Decreased Interest 0 0 0  Down, Depressed, Hopeless 0 0 0  PHQ - 2 Score 0 0 0  Altered sleeping 0 0 0  Tired, decreased energy 0 0 0  Change in appetite 0 0 0  Feeling bad or failure about yourself  0 0 0  Trouble concentrating 0 0 0  Moving slowly or fidgety/restless 0 0 0  Suicidal thoughts 0 0 0  PHQ-9 Score 0 0 0  Difficult doing work/chores Not difficult at all Not difficult at all Not difficult at all      09/25/2023    2:11 PM 08/15/2023    4:00 PM 08/12/2023    4:27 PM 06/27/2022    2:34 PM  GAD 7 : Generalized Anxiety Score  Nervous, Anxious, on Edge 0 0 0 0  Control/stop worrying 0 0 0 0  Worry too much - different things 0 0 0 0  Trouble relaxing 0 0 0 0  Restless 0 0 0 0  Easily annoyed or irritable 0 0 0 0  Afraid - awful might happen 0 0 0 0  Total GAD 7 Score 0 0 0 0  Anxiety Difficulty Not difficult at all Not difficult at all Not difficult at all Not difficult at all   SDOH Screenings   Depression (PHQ2-9): Low Risk  (09/25/2023)  Tobacco Use: Low Risk  (09/25/2023)     Physical Exam Constitutional:      General: He is not in acute distress.    Appearance: Normal appearance.  HENT:     Head: Normocephalic and atraumatic.     Comments: Right neck/base of jaw: cystic lesion, measuring about 0.7 cm in diameter with thick, white, keratinous material when squeezed without redness, pain.     Right Ear: Tympanic membrane normal.     Left Ear: Tympanic membrane normal.     Mouth/Throat:     Mouth: Mucous membranes are moist.  Neck:     Thyroid : No thyroid  mass or thyroid  tenderness.  Cardiovascular:     Rate and Rhythm: Normal rate and regular rhythm.  Pulmonary:     Effort: Pulmonary effort is normal.     Breath sounds: Normal breath sounds.  Abdominal:     General: Bowel sounds are normal.      Palpations: Abdomen is soft.     Tenderness: There is no guarding or rebound.  Musculoskeletal:     Cervical back: Neck supple. No rigidity or tenderness.  Right lower leg: No edema.     Left lower leg: No edema.  Skin:    General: Skin is warm.  Neurological:     Mental Status: He is alert and oriented to person, place, and time.  Psychiatric:        Mood and Affect: Mood normal.        Behavior: Behavior normal.        No results found for any visits on 09/25/23.  The ASCVD Risk score (Arnett DK, et al., 2019) failed to calculate for the following reasons:   The valid total cholesterol range is 130 to 320 mg/dL     Assessment & Plan:   Annual physical exam Assessment & Plan: Physical exam completed. Counseled on regular exercise, healthy diet. Blood pressure well-controlled. Advised reducing beer consumption due to potential impact on kidney stones and overall health.    Hypertension associated with diabetes (HCC) Assessment & Plan: BP within goal. Continue amlodipine  10 mg, Chlorthalidone  25 mg daily, telmisartan  80 mg daily. Check CMP  Orders: -     Aspirin ; Take 1 tablet (81 mg total) by mouth daily. Swallow whole.  Dispense: 90 tablet; Refill: 3 -     Chlorthalidone ; Take 1 tablet (25 mg total) by mouth daily.  Dispense: 90 tablet; Refill: 3 -     amLODIPine  Besylate; Take 1 tablet (10 mg total) by mouth every morning.  Dispense: 30 tablet; Refill: 4 -     Telmisartan ; Take 1 tablet (80 mg total) by mouth daily.  Dispense: 30 tablet; Refill: 4  Hyperlipidemia associated with type 2 diabetes mellitus (HCC) Assessment & Plan: Chronic, LDL within goal <70, continue Lipitor 20 mg at bedtime.   Orders: -     Atorvastatin  Calcium ; Take 1 tablet (20 mg total) by mouth at bedtime.  Dispense: 90 tablet; Refill: 3 -     Comprehensive metabolic panel with GFR  Type 2 diabetes mellitus with complications (HCC) Assessment & Plan: Chronic with microalbuminuria.   Check A1c, urine microalbumin. Continue metformin  500 mg twice daily. Encourage adherence to low carbohydrate diet and regular exercise. On Lipitor 20 mg, aspirin  81 mg continue.    Orders: -     metFORMIN  HCl; Take 1 tablet (500 mg total) by mouth 2 (two) times daily with a meal.  Dispense: 180 tablet; Refill: 3 -     Microalbumin / creatinine urine ratio -     Hemoglobin A1c  Screening for prostate cancer Assessment & Plan: Check PSA, has a h/o elevated PSA in the past.   Orders: -     PSA  Epidermoid cyst of face Assessment & Plan: Asymptomatic, non-inflamed.  Discussed definitive treatment would be surgical excision. However, given location of the cyst, h/o keloid on previous surgical site (r ear) it is okay to watch for now. Patient will reach out if he desires surgical removal for referral to general surgery. If sign of infection, recommend f/u sooner.     Nephrolithiasis Assessment & Plan:  F/u and management per urology.  Monitor for signs of kidney stones, such as flank pain.  Encourage continued adequate hydration and low salt diet   Vitamin B 12 deficiency Assessment & Plan: Low normal B12 in the past, likely related to Metformin  use. Benefit of metformin  outweighs so continue Metformin  for DM.  Check B 12 level, management pending results.       Return in about 6 months (around 03/27/2024) for DM, HTN follow up .   Dorthey Depace,  MD

## 2023-09-25 NOTE — Assessment & Plan Note (Signed)
 Physical exam completed. Counseled on regular exercise, healthy diet. Blood pressure well-controlled. Advised reducing beer consumption due to potential impact on kidney stones and overall health.

## 2023-09-25 NOTE — Assessment & Plan Note (Signed)
 Asymptomatic, non-inflamed.  Discussed definitive treatment would be surgical excision. However, given location of the cyst, h/o keloid on previous surgical site (r ear) it is okay to watch for now. Patient will reach out if he desires surgical removal for referral to general surgery. If sign of infection, recommend f/u sooner.

## 2023-09-25 NOTE — Assessment & Plan Note (Signed)
 BP within goal. Continue amlodipine  10 mg, Chlorthalidone  25 mg daily, telmisartan  80 mg daily. Check CMP

## 2023-09-26 LAB — COMPREHENSIVE METABOLIC PANEL WITH GFR
ALT: 18 U/L (ref 0–53)
AST: 20 U/L (ref 0–37)
Albumin: 4.3 g/dL (ref 3.5–5.2)
Alkaline Phosphatase: 65 U/L (ref 39–117)
BUN: 19 mg/dL (ref 6–23)
CO2: 31 meq/L (ref 19–32)
Calcium: 9.5 mg/dL (ref 8.4–10.5)
Chloride: 97 meq/L (ref 96–112)
Creatinine, Ser: 1.05 mg/dL (ref 0.40–1.50)
GFR: 74.82 mL/min (ref >60.00)
Glucose, Bld: 119 mg/dL — ABNORMAL HIGH (ref 70–99)
Potassium: 4.5 meq/L (ref 3.5–5.1)
Sodium: 138 meq/L (ref 135–145)
Total Bilirubin: 0.9 mg/dL (ref 0.2–1.2)
Total Protein: 7.3 g/dL (ref 6.0–8.3)

## 2023-09-26 LAB — MICROALBUMIN / CREATININE URINE RATIO
Creatinine,U: 140.1 mg/dL
Microalb Creat Ratio: 13.3 mg/g (ref 0.0–30.0)
Microalb, Ur: 1.9 mg/dL (ref 0.0–1.9)

## 2023-09-26 LAB — HEMOGLOBIN A1C: Hgb A1c MFr Bld: 6.9 % — ABNORMAL HIGH (ref 4.6–6.5)

## 2023-09-26 LAB — PSA: PSA: 4.77 ng/mL — ABNORMAL HIGH (ref 0.10–4.00)

## 2023-09-30 ENCOUNTER — Ambulatory Visit: Payer: Self-pay

## 2023-09-30 DIAGNOSIS — R972 Elevated prostate specific antigen [PSA]: Secondary | ICD-10-CM

## 2023-09-30 DIAGNOSIS — E118 Type 2 diabetes mellitus with unspecified complications: Secondary | ICD-10-CM

## 2023-09-30 NOTE — Progress Notes (Signed)
 Dr. Gretel Atlas was seen for annual physical, PSA is mildly elevated. Given his history of recurrent kidney stone, stent I recommend he also follow up with urology if repeat PSA is high.   Thank you,  Luke Shade, MD

## 2023-10-01 ENCOUNTER — Other Ambulatory Visit (INDEPENDENT_AMBULATORY_CARE_PROVIDER_SITE_OTHER)

## 2023-10-01 DIAGNOSIS — E118 Type 2 diabetes mellitus with unspecified complications: Secondary | ICD-10-CM | POA: Diagnosis not present

## 2023-10-01 DIAGNOSIS — R972 Elevated prostate specific antigen [PSA]: Secondary | ICD-10-CM

## 2023-10-02 ENCOUNTER — Ambulatory Visit: Payer: Self-pay

## 2023-10-02 LAB — COMPREHENSIVE METABOLIC PANEL WITH GFR
ALT: 22 U/L (ref 0–53)
AST: 26 U/L (ref 0–37)
Albumin: 4.3 g/dL (ref 3.5–5.2)
Alkaline Phosphatase: 53 U/L (ref 39–117)
BUN: 22 mg/dL (ref 6–23)
CO2: 31 meq/L (ref 19–32)
Calcium: 9.2 mg/dL (ref 8.4–10.5)
Chloride: 100 meq/L (ref 96–112)
Creatinine, Ser: 1.12 mg/dL (ref 0.40–1.50)
GFR: 69.23 mL/min (ref >60.00)
Glucose, Bld: 117 mg/dL — ABNORMAL HIGH (ref 70–99)
Potassium: 4.4 meq/L (ref 3.5–5.1)
Sodium: 140 meq/L (ref 135–145)
Total Bilirubin: 0.9 mg/dL (ref 0.2–1.2)
Total Protein: 7.2 g/dL (ref 6.0–8.3)

## 2023-10-02 LAB — HEMOGLOBIN A1C: Hgb A1c MFr Bld: 7 % — ABNORMAL HIGH (ref 4.6–6.5)

## 2023-10-02 LAB — PSA: PSA: 4.71 ng/mL — ABNORMAL HIGH (ref 0.10–4.00)

## 2023-10-02 NOTE — Progress Notes (Signed)
 Talked with patient's wife on the phone and updated elevated PSA results. Recommend scheduling appointment with urologist for DRE and further evaluation into elevated PSA given patient's h/o recurrent nephrolithiasis.   A1c was checked during this lab visit which was not recommended. Patient's A1c was just checked on 09/25/23. I have discussed this with our office manager and are working on fixing this error and not getting patient charged for repeat A1c.   Luke Shade, MD

## 2023-10-06 ENCOUNTER — Other Ambulatory Visit: Payer: Self-pay

## 2023-10-27 ENCOUNTER — Other Ambulatory Visit: Payer: Self-pay

## 2023-11-21 DIAGNOSIS — R972 Elevated prostate specific antigen [PSA]: Secondary | ICD-10-CM | POA: Diagnosis not present

## 2023-11-28 DIAGNOSIS — R399 Unspecified symptoms and signs involving the genitourinary system: Secondary | ICD-10-CM | POA: Diagnosis not present

## 2023-11-28 DIAGNOSIS — R972 Elevated prostate specific antigen [PSA]: Secondary | ICD-10-CM | POA: Diagnosis not present

## 2023-12-13 ENCOUNTER — Other Ambulatory Visit: Payer: Self-pay

## 2023-12-14 ENCOUNTER — Other Ambulatory Visit: Payer: Self-pay

## 2023-12-15 ENCOUNTER — Other Ambulatory Visit: Payer: Self-pay

## 2023-12-16 ENCOUNTER — Other Ambulatory Visit: Payer: Self-pay

## 2023-12-16 NOTE — Telephone Encounter (Signed)
 This prescription for potassium comes from his urologist Dr. Gretel CANDIE Ferrara, please forward this refill request to his office.   Thank you,  Luke Shade, MD

## 2023-12-22 ENCOUNTER — Other Ambulatory Visit: Payer: Self-pay

## 2023-12-22 MED ORDER — POTASSIUM CITRATE ER 10 MEQ (1080 MG) PO TBCR
30.0000 meq | EXTENDED_RELEASE_TABLET | Freq: Two times a day (BID) | ORAL | 4 refills | Status: AC
  Filled 2023-12-22 (×2): qty 180, 30d supply, fill #0
  Filled 2024-02-15 – 2024-02-16 (×2): qty 180, 30d supply, fill #1

## 2023-12-22 MED ORDER — POTASSIUM CITRATE ER 10 MEQ (1080 MG) PO TBCR
30.0000 meq | EXTENDED_RELEASE_TABLET | Freq: Two times a day (BID) | ORAL | 5 refills | Status: AC
  Filled 2023-12-22: qty 180, 30d supply, fill #0

## 2023-12-23 ENCOUNTER — Other Ambulatory Visit: Payer: Self-pay

## 2024-02-02 ENCOUNTER — Encounter

## 2024-02-16 ENCOUNTER — Other Ambulatory Visit: Payer: Self-pay

## 2024-02-16 ENCOUNTER — Other Ambulatory Visit (HOSPITAL_COMMUNITY): Payer: Self-pay

## 2024-09-24 ENCOUNTER — Encounter

## 2024-09-27 ENCOUNTER — Encounter
